# Patient Record
Sex: Male | Born: 1940 | Race: White | Hispanic: No | Smoking: Never smoker
Health system: Southern US, Community
[De-identification: ages and names within clinical notes are randomized; demographics above are authoritative.]

## PROBLEM LIST (undated history)

## (undated) DIAGNOSIS — E119 Type 2 diabetes mellitus without complications: Secondary | ICD-10-CM

## (undated) DIAGNOSIS — I639 Cerebral infarction, unspecified: Secondary | ICD-10-CM

## (undated) DIAGNOSIS — I10 Essential (primary) hypertension: Secondary | ICD-10-CM

## (undated) DIAGNOSIS — I519 Heart disease, unspecified: Secondary | ICD-10-CM

## (undated) DIAGNOSIS — I2510 Atherosclerotic heart disease of native coronary artery without angina pectoris: Secondary | ICD-10-CM

## (undated) DIAGNOSIS — I5189 Other ill-defined heart diseases: Secondary | ICD-10-CM

## (undated) DIAGNOSIS — R55 Syncope and collapse: Secondary | ICD-10-CM

## (undated) DIAGNOSIS — E785 Hyperlipidemia, unspecified: Secondary | ICD-10-CM

## (undated) DIAGNOSIS — R931 Abnormal findings on diagnostic imaging of heart and coronary circulation: Secondary | ICD-10-CM

## (undated) HISTORY — DX: Hyperlipidemia, unspecified: E78.5

## (undated) HISTORY — DX: Cerebral infarction, unspecified: I63.9

## (undated) HISTORY — DX: Atherosclerotic heart disease of native coronary artery without angina pectoris: I25.10

## (undated) HISTORY — DX: Type 2 diabetes mellitus without complications: E11.9

## (undated) HISTORY — DX: Essential (primary) hypertension: I10

## (undated) HISTORY — DX: Heart disease, unspecified: I51.9

## (undated) HISTORY — DX: Syncope and collapse: R55

## (undated) HISTORY — DX: Abnormal findings on diagnostic imaging of heart and coronary circulation: R93.1

## (undated) HISTORY — DX: Other ill-defined heart diseases: I51.89

---

## 1999-04-01 ENCOUNTER — Encounter: Payer: Self-pay | Admitting: Internal Medicine

## 1999-04-01 ENCOUNTER — Inpatient Hospital Stay (HOSPITAL_COMMUNITY): Admission: EM | Admit: 1999-04-01 | Discharge: 1999-04-02 | Payer: Self-pay | Admitting: Emergency Medicine

## 2001-05-06 ENCOUNTER — Encounter: Payer: Self-pay | Admitting: *Deleted

## 2001-05-06 ENCOUNTER — Emergency Department (HOSPITAL_COMMUNITY): Admission: EM | Admit: 2001-05-06 | Discharge: 2001-05-06 | Payer: Self-pay | Admitting: *Deleted

## 2001-06-03 ENCOUNTER — Ambulatory Visit (HOSPITAL_COMMUNITY): Admission: RE | Admit: 2001-06-03 | Discharge: 2001-06-03 | Payer: Self-pay | Admitting: Internal Medicine

## 2002-01-22 HISTORY — PX: CARDIAC CATHETERIZATION: SHX172

## 2002-01-30 ENCOUNTER — Encounter: Payer: Self-pay | Admitting: Thoracic Surgery (Cardiothoracic Vascular Surgery)

## 2002-02-03 ENCOUNTER — Encounter: Payer: Self-pay | Admitting: Thoracic Surgery (Cardiothoracic Vascular Surgery)

## 2002-02-03 ENCOUNTER — Inpatient Hospital Stay (HOSPITAL_COMMUNITY)
Admission: RE | Admit: 2002-02-03 | Discharge: 2002-02-08 | Payer: Self-pay | Admitting: Thoracic Surgery (Cardiothoracic Vascular Surgery)

## 2002-02-03 DIAGNOSIS — I2510 Atherosclerotic heart disease of native coronary artery without angina pectoris: Secondary | ICD-10-CM

## 2002-02-03 HISTORY — DX: Atherosclerotic heart disease of native coronary artery without angina pectoris: I25.10

## 2002-02-03 HISTORY — PX: CORONARY ARTERY BYPASS GRAFT: SHX141

## 2002-02-04 ENCOUNTER — Encounter: Payer: Self-pay | Admitting: Thoracic Surgery (Cardiothoracic Vascular Surgery)

## 2002-02-05 ENCOUNTER — Encounter: Payer: Self-pay | Admitting: Thoracic Surgery (Cardiothoracic Vascular Surgery)

## 2002-03-03 ENCOUNTER — Encounter: Payer: Self-pay | Admitting: Thoracic Surgery (Cardiothoracic Vascular Surgery)

## 2002-03-03 ENCOUNTER — Encounter
Admission: RE | Admit: 2002-03-03 | Discharge: 2002-03-03 | Payer: Self-pay | Admitting: Thoracic Surgery (Cardiothoracic Vascular Surgery)

## 2002-03-23 ENCOUNTER — Encounter (HOSPITAL_COMMUNITY): Admission: RE | Admit: 2002-03-23 | Discharge: 2002-04-22 | Payer: Self-pay | Admitting: Cardiovascular Disease

## 2002-04-24 ENCOUNTER — Encounter (HOSPITAL_COMMUNITY): Admission: RE | Admit: 2002-04-24 | Discharge: 2002-05-24 | Payer: Self-pay | Admitting: Cardiovascular Disease

## 2002-05-25 ENCOUNTER — Encounter (HOSPITAL_COMMUNITY): Admission: RE | Admit: 2002-05-25 | Discharge: 2002-06-24 | Payer: Self-pay | Admitting: Cardiovascular Disease

## 2003-03-28 ENCOUNTER — Emergency Department (HOSPITAL_COMMUNITY): Admission: EM | Admit: 2003-03-28 | Discharge: 2003-03-29 | Payer: Self-pay | Admitting: *Deleted

## 2003-03-29 ENCOUNTER — Encounter: Payer: Self-pay | Admitting: *Deleted

## 2003-06-09 ENCOUNTER — Encounter: Payer: Self-pay | Admitting: Family Medicine

## 2003-06-09 ENCOUNTER — Ambulatory Visit (HOSPITAL_COMMUNITY): Admission: RE | Admit: 2003-06-09 | Discharge: 2003-06-09 | Payer: Self-pay | Admitting: Family Medicine

## 2005-01-07 ENCOUNTER — Inpatient Hospital Stay (HOSPITAL_COMMUNITY): Admission: EM | Admit: 2005-01-07 | Discharge: 2005-01-11 | Payer: Self-pay | Admitting: Emergency Medicine

## 2005-01-07 ENCOUNTER — Ambulatory Visit: Payer: Self-pay | Admitting: Orthopedic Surgery

## 2005-01-11 ENCOUNTER — Ambulatory Visit: Payer: Self-pay | Admitting: Orthopedic Surgery

## 2005-01-12 ENCOUNTER — Ambulatory Visit (HOSPITAL_COMMUNITY): Admission: RE | Admit: 2005-01-12 | Discharge: 2005-01-12 | Payer: Self-pay | Admitting: Orthopedic Surgery

## 2005-01-12 ENCOUNTER — Ambulatory Visit: Payer: Self-pay | Admitting: Orthopedic Surgery

## 2005-01-15 ENCOUNTER — Ambulatory Visit: Payer: Self-pay | Admitting: Orthopedic Surgery

## 2005-01-29 ENCOUNTER — Ambulatory Visit: Payer: Self-pay | Admitting: Orthopedic Surgery

## 2005-03-12 ENCOUNTER — Ambulatory Visit: Payer: Self-pay | Admitting: Orthopedic Surgery

## 2007-09-02 ENCOUNTER — Ambulatory Visit: Payer: Self-pay | Admitting: Internal Medicine

## 2007-09-02 ENCOUNTER — Inpatient Hospital Stay (HOSPITAL_COMMUNITY): Admission: EM | Admit: 2007-09-02 | Discharge: 2007-09-09 | Payer: Self-pay | Admitting: Emergency Medicine

## 2007-09-08 ENCOUNTER — Ambulatory Visit: Payer: Self-pay | Admitting: Physical Medicine & Rehabilitation

## 2007-10-14 ENCOUNTER — Encounter: Admission: RE | Admit: 2007-10-14 | Discharge: 2007-10-14 | Payer: Self-pay | Admitting: Neurological Surgery

## 2011-04-03 NOTE — Discharge Summary (Signed)
Mark Montoya, Mark Montoya                  ACCOUNT NO.:  [id number]   MEDICAL RECORD NO.:  [id number]          PATIENT TYPE:  INP   LOCATION:  3023                         FACILITY:  [facility]   PHYSICIAN:  Tia Alert, MD     DATE OF BIRTH:  Aug 27, 1941   DATE OF ADMISSION:  09/02/2007  DATE OF DISCHARGE:  09/09/2007                               DISCHARGE SUMMARY   ADMISSION DIAGNOSIS:  Left cerebellar hemorrhage and hypertension.   HISTORY OF PRESENT ILLNESS:  Mr. Boyd is a 70 year old gentleman with a  history of coronary artery disease, diabetes and hypertension, who was  brought to the emergency department with acute onset headache,  dizziness, nausea and vomiting on the morning of September 02, 2007.  He  was admitted with a blood pressure of 194/94.  He complained of severe  headache. He had head CT which showed a small, left cerebellar  hemorrhage and neurosurgical evaluation was requested.  The patient  complained of a headache with nausea and vomiting.  Had some emesis in  the emergency department.  He also had some double vision at that time  but he was awake and alert followed commands.  His blood pressure upon  my arrival to the emergency department was 164/65 with a pulse of 62 and  he has been treated by the emergency room physicians.  Head CT showed  the small cerebellar hemorrhage without evidence of hydrocephalus.  We  felt this needed no surgical intervention.  Acutely he was admitted to  the neurosurgical ICU for further care.   HOSPITAL COURSE:  The patient was admitted to the neurosurgical ICU.  The critical care medical physicians were consulted and they helped  control his blood pressure.  Initially this was controlled with a  Cardene drip.  We were then able to wean the Cardene drip over time and  place him back on his minoxidil, clonidine and Norvasc as he was taking  at home and he had good control of his blood pressure.  He had an early  follow-up head CT just 6  hours after admission which showed no  progression of the hemorrhage or developing hydrocephalus.  He then had  a follow-up head CT 2 days later which again was stable.  He did quite  well neurologically. Physical and occupational therapy were consulted.  He was able to ambulate with his walker.  He had some imbalance.  Rehabilitation was consulted.  They felt he was doing too well for  inpatient rehabilitation and suggested we send him home with outpatient  physical and occupational therapy.  This was arranged and he was  discharged to home in stable condition on September 09, 2007, with plans  to follow up with me in 2 weeks.   DISCHARGE MEDICATIONS:  1. Minoxidil 2.5 mg p.o. b.i.d.  2. Clonidine 0.1 mg p.o. b.i.d.  3. Norvasc 10 mg p.o. daily.  4. He was to continue his aspirin as he was taking at home.  5. He also was to resume his Aricept, Glucophage, Xanax, Nitrostat,      Benadryl, vitamin  C and multivitamin and glipizide as he was using      at home.   He had all of his discharge questions answered to his satisfaction and  demonstrated understanding of all discharge instructions.  His followup  is in 2 weeks with me.  He was asked to follow up with his primary care  physician regarding blood pressure control.   FINAL DIAGNOSES:  1. Left cerebellar hemorrhage.  2. Hypertension.  3. Diabetes mellitus.      Tia Alert, MD  Electronically Signed     DSJ/MEDQ  D:  09/09/2007  T:  09/09/2007  Job:  [id number]

## 2011-04-03 NOTE — H&P (Signed)
NAMEWESLY, WHISENANT NO.:  [id number]   MEDICAL RECORD NO.:  [id number]          PATIENT TYPE:  INP   LOCATION:  1832                         FACILITY:  [facility]   PHYSICIAN:  Tia Alert, MD     DATE OF BIRTH:  [birth year]   DATE OF ADMISSION:  09/02/2007  DATE OF DISCHARGE:                              HISTORY & PHYSICAL   ADMITTING DIAGNOSIS:  Left cerebellar hemorrhage and hypertension.   HISTORY OF PRESENT ILLNESS:  Mr. Perko is a 70 year old gentleman with a  history of coronary artery disease, diabetes and hypertension who was  brought to the emergency department today with headache, dizziness, and  nausea and vomiting that started this morning after breakfast.  He came  with a blood pressure of 194/94.  He complained of headache.  Head CT  showed a small left cerebellar hemorrhage and neurosurgical evaluation  was requested.  The patient now complains of headache and some nausea  and vomiting.  He actually had some emesis while I was in the room.  He  denies any significant double vision, change in vision, seizure  activity, numbness, tingling or weakness.   PAST MEDICAL HISTORY:  1. Coronary artery disease.  2. Diabetes.  3. Hypertension.  4. Hyperlipidemia.  5. Anxiety.   MEDICATIONS INCLUDE:  Xanax, minoxidil, metformin, Aricept, potassium  chloride, lisinopril, bumetanide, Norvasc, glipizide, clonidine,  Nitrostat, Ambien, aspirin, Benadryl, vitamin C, and multivitamin.   ALLERGIES:  Penicillin.   SOCIAL HISTORY:  He is a nondrinker, nonsmoker.  He is a Visual merchandiser.   PHYSICAL EXAM:  His blood pressure is now 164/65, pulse is 62,  temperature 96.8.  GENERAL:  Cooperative gentleman lying in a stretcher in some obvious  discomfort.  HEENT: No evidence of external trauma.  His extraocular movements are  intact with some nystagmus, which is fine to the left, not much  nystagmus to the right, pupils are equal and reactive.  No facial  asymmetry.  NECK:  Is supple.  HEART:  Regular rate and rhythm.  EXTREMITIES:  No obvious deformities.  NEUROLOGICAL EXAM:  He is awake and alert.  He is interactive.  He  answers questions appropriately, he follows commands briskly, no facial  asymmetry.  Tongue protrudes in midline and he can puff out his cheeks.  No pronator drift.  He moves all extremities well.  He seems to have  good rapid alternating movements, good finger tapping and good finger-  nose-finger.  His gait is not tested.   IMAGING STUDIES:  CT scan of the head I have reviewed as well as report.  This shows a small flame shape hemorrhage in the left cerebellar tonsil  extending up in the medial left cerebellar hemisphere.  There is some  mild mass effect upon the brainstem and fourth ventricle, but the fourth  ventricle appears to be open at this point.  There is no hydrocephalus.  The third ventricle is still slit-like and not rounded.  There are no  significant temporal horns.   ASSESSMENT AND PLAN:  This is a 70 year old gentleman with a  hypertensive cerebellar hemorrhage on the left.  I would like to  repeat  his CT scan in about 4 hours to make sure there is no progression of the  hemorrhage or progression of hydrocephalus.  I have explained all of  this to the family.  We would like to control his blood pressure with IV  blood pressure medications.  We will ask the critical care medicine  physicians to help with that.  I have explained to them the risk of  progressively enlarging hemorrhage in the risk of development of  hydrocephalus and the potential treatments of those things and they  demonstrated understanding.  We will admit him to the ICU.  We will  check his neurologic exam every hour, keep his head of bed elevated,  again repeat his head CT in 4 hours.  We will control his hyperglycemia  with a sliding scale insulin.  We will continue his home medications  other than his aspirin, his Benadryl and his  Ambien.      Tia Alert, MD  Electronically Signed     DSJ/MEDQ  D:  09/02/2007  T:  09/03/2007  Job:  [id number]

## 2011-04-03 NOTE — Consult Note (Signed)
NAME:  Mark Montoya, Mark Montoya                  ACCOUNT NO.:  [id number]   MEDICAL RECORD NO.:  [id number]           PATIENT TYPE:   LOCATION:                                 FACILITY:   PHYSICIAN:  Kalman Shan, MD   DATE OF BIRTH:  March 18, 1941   DATE OF CONSULTATION:  09/02/2007  DATE OF DISCHARGE:                                 CONSULTATION   NOTE DICTATED BY: Zenia Resides NP  EDITED and Supervised by: Dr. Kalman Shan, MD   REFERRING PHYSICIAN:  Dr. Marikay Alar   REASON FOR CONSULTATION:  Hypertension management and critical care  medicine in the setting of left cerebellar hemorrhage.   HISTORY OF PRESENT ILLNESS:  This is a 70 year old white male patient  who was in his usual state of health until this morning.  Per his wife  and daughter's recollection, he went out to breakfast this morning and  upon returning in the later morning at his daughter's house she noted  him to be staggering and he reported that he felt drunk.  They noted  rapidly progressive weakness and acute onset of headache with associated  nausea, vomiting, and diaphoresis.  His daughter took his blood pressure  at her house as the patient actually had his blood pressure machine with  him stating he thought his blood pressure was up.  She noted it to be in  the 180's over 90's.  Because of his profound weakness he was unable to  ambulate without the assistance of two grown adults.  Because of this  the EMS was called.  Per the family's recollection, EMS arrived  approximately at 12:30.  He was transferred to [facility].  A  CT of the head was obtained that demonstrated a left cerebellar  intracranial hemorrhage.  The pulmonary critical care service was asked  to assist with his care specifically for blood pressure management.   PAST MEDICAL HISTORY:  1. Coronary artery disease status post CABG in 2003 by Dr.      Dorris Fetch.  2. Hypertension.  3. Syncopal episodes thought to be secondary to  multiple      antihypertensive medications.  4. Type 2 diabetes.  5. Depression.  6. Hyperlipidemia.  7. Dementia with good response to Aricept.   SOCIAL HISTORY:  Works on a farm, never smoked, does chew tobacco.  Denies alcohol.  Fully functional in spite of history of dementia.  Family reports his medications are set out for him daily and that he is  compliant.   FAMILY HISTORY:  Coronary artery disease, history of strokes, history of  diabetes.   ALLERGIES:  PENICILLIN.   HOME MEDICATIONS:  Family does not have dosing at this time.  Xanax,  minoxidil, metformin, Aricept, potassium chloride, lisinopril, Betadine,  Norvasc, vitamin C, glipizide, clonidine, nitroglycerin sublingual,  Ambien, and aspirin.   REVIEW OF SYSTEMS:  Per HPI.   PHYSICAL EXAMINATION:  VITAL SIGNS:  Temperature 96.8, heart rate in the  60's, normal sinus rhythm, blood pressure 180's/90's to 140's/70s,  respirations 20, saturations 100%.  GENERAL:  This is a pleasant  70 year old male patient currently  complaining of some mild photophobia and photoirritation, no acute  distress at this time.  HEENT:  Normocephalic.  No JVD or adenopathy.  PULMONARY:  Clear to auscultation.  CARDIAC:  Regular rate and rhythm.  EXTREMITIES:  Notable for brisk cap refill, 2+ pulses, no edema.  ABDOMEN:  Soft, nontender, positive bowel sounds.  GU:  Able to void.  NEUROLOGIC:  Moves all extremities, oriented x3.  No focal deficits.  Cranial nerves are currently intact.  Again does note some  photosensitivity. Dr. Marchelle Gearing exam at approx 17:15h noted some mild  past pointing on finger to finger test on left side. No past pointnig on  heel-knee test or finger-nose test. No intention tremor.   LABORATORY DATA:  White blood cell count 9, hemoglobin 11.9, hematocrit  35, platelet count 140.  Cardiac markers:  CK-MB 1.4, troponin I 0.05.  Sodium 139, potassium 3.3, chloride 105, CO2 26, glucose 215, BUN 15,  creatinine  0.99.  Total bilirubin 1.3.   CT of head obtained today demonstrating a left cerebellar intercerebral  hemorrhage with mass effect upon fourth ventricle.   IMPRESSION/PLAN:  1. Left cerebellar intracranial.  Plan for this is to provide strict      blood pressure control.  I have spoken with Dr. Yetta Barre and agreed      upon goal of systolic goal range from 130-160 in the acute setting      given that the patient likely does have longstanding poorly      controlled hypertension.  To achieve this we will initiate him on a      Cardene drip initiating at 5 mg an hour and titration again for      goal of 130 to 160 systolic blood pressure.  Additionally from a      plan for this he will obtain a follow-up CT of head at 7 p.m.      tonight given concern for obstructive hydrocephalus given mass      effect on fourth ventricle.   1. Hypertension.  This appears to be longstanding given his multiple      antihypertensive medication regimen.  Given his risk for altered      mental status secondary to problem #1, we will keep him n.p.o. and      place him on a Cardene drip with a goal as previously described as      keeping his systolic blood pressure 130 to 160.  Will keep n.p.o.      at this time and then after it is evident that he is clinically      stabilized can resume antihypertensive regimen per home.   1. Diabetes type 2.  Plan for this is to continue sliding scale      protocol and then finally hospital care plan for this is to provide      a routine prophylaxis in the form of bilateral PAS hose for DVT      prophylaxis as well as proton pump inhibitors for stress ulcer      prophylaxis.   STAFF NOTE: Personally evaluated [name]. Spent 35 minutes critical  care time in eliciting history from [facility], and notes and from RN  in 3100. Personally examined patient. Agree with NPs notes, findings and  plan. KEY: Cerebellar hemorrhage with mass effect but very little focal  effect  currently. BP to be kept between 130-160 systolic through  nicardipine drip. Needs a-line.  Zenia Resides, NP      Kalman Shan, MD  Electronically Signed    PB/MEDQ  D:  09/02/2007  T:  09/03/2007  Job:  [id number]   cc:   Marikay Alar, M.D.

## 2011-04-06 NOTE — Procedures (Signed)
NAMEBINYAMIN, Mark Montoya                  ACCOUNT NO.:  [id number]   MEDICAL RECORD NO.:  [id number]          PATIENT TYPE:  INP   LOCATION:  A225                          FACILITY:  [facility]   PHYSICIAN:  Dani Gobble, MD       DATE OF BIRTH:  28-Apr-1941   DATE OF PROCEDURE:  01/08/2005  DATE OF DISCHARGE:                                  ECHOCARDIOGRAM   REFERRING PHYSICIAN:  Dr. Sudie Bailey.   INDICATIONS:  [name] is a 70 year old gentleman with a past medical  history of diabetes mellitus and CAD status post CABG in 2003 who  experienced presyncope and is referred for echocardiogram to evaluate  structural heart disease.   TECHNICAL QUALITY OF THE STUDY:  Adequate.   The aorta is within normal limits at 3.3 cm.   The left atrium is mildly dilated.  The patient appeared to be in sinus  rhythm during this procedure.  No obvious clots or masses were appreciated.   The __________ septum and posterior wall are both mildly thickened with  additional basal septal hypertrophy overlay.   The aortic valve is mildly thickened with mild calcification but no  significant restriction to leaflet excursion.  Mild to moderate aortic  insufficiency is noted.  Doppler interrogation of the aortic valve is  consistent with mild aortic sclerosis without significant valvular stenosis.   The mitral valve is mildly thickened but with normal leaflet excursion.  No  mitral valve prolapse is noted.  Mild mitral annular calcification is noted.  Mild mitral regurgitation is noted.  Doppler interrogation of the mitral  valve is within normal limits.   The pulmonic valve is not well visualized.   The tricuspid valve appears grossly structurally normal with mild tricuspid  regurgitation noted.   The left ventricle is normal in size with the LVIDd measured at 4.6 cm and  the LVIDs measured at 3.4 cm.  Overall left ventricular systolic function is  normal.  No regional wall motion abnormalities are noted.   The  right atrium and right ventricle appear grossly normal in size, and  right ventricular systolic function appears normal.   IMPRESSION:  1.  Mild left atrial enlargement.  2.  Mild concentric left ventricular hypertrophy, with additional basal      septal hypertrophy overlay.  3.  Mild aortic sclerosis, without stenosis.  4.  Mild to moderate aortic insufficiency.  5.  Mild mitral regurgitation.  6.  Mild tricuspid regurgitation.  7.  Mild mitral annular calcification.  8.  Normal left ventricular size and systolic function, without regional      wall motion abnormality noted.      AB/MEDQ  D:  01/08/2005  T:  01/09/2005  Job:  [id number]   cc:   [name], M.D.  [address] [place], [place] [address]  Fax: [phone]

## 2011-04-06 NOTE — Consult Note (Signed)
NAMEHALLIE, ISHIDA                  ACCOUNT NO.:  [id number]   MEDICAL RECORD NO.:  [id number]          PATIENT TYPE:  INP   LOCATION:  A225                          FACILITY:  [facility]   PHYSICIAN:  Vickki Hearing, M.D.DATE OF BIRTH:  04-24-1941   DATE OF CONSULTATION:  01/10/2005  DATE OF DISCHARGE:  01/11/2005                                   CONSULTATION   REQUESTING PHYSICIAN:  Dr. Sudie Bailey.   REASON FOR CONSULTATION:  Right long finger swelling.   HISTORY:  The history is recorded in the medical record by Dr. Sudie Bailey. The  patient has a crush injury to the right long finger which was treated with  paper clip drainage but did not respond well and has progressively increased  in swelling and is causing the patient discomfort. Radiographs showed a  questionable fracture at the middle phalanx of the right long finger.   PHYSICAL EXAMINATION:  GENERAL:  The patient's exam shows a mesomorphic  male. Normal development, grooming and hygiene. Normal pulses without  swelling, edema, or tenderness. No lymphadenopathy. The sensation to the  digit is intact. He is awake, alert, and oriented x3 with a pleasant mood.  SKIN AND MUSCULOSKELETAL:  There is ecchymosis in the finger with hematoma  under the nail and paronychial tissue. The pulp space appears to be  nontender though swollen. There is no lymphangitis. Range of motion at the  IP joint remains normal. There is no instability to the DIP or PIP joint.   Radiographs as stated.   IMPRESSION:  Right long finger paronychia.   RECOMMENDATIONS:  Recommendation drainage when patient medically cleared.      SEH/MEDQ  D:  01/14/2005  T:  01/15/2005  Job:  [id number]

## 2011-04-06 NOTE — Discharge Summary (Signed)
[facility]  Patient:    ESIAS, MORY Visit Number: [id number] MRN: [id number]          Service Type: SUR Location: 2000 2029 01 Attending Physician:  Charlett Lango Dictated by:   Gwenyth Bender, R.N.F.A. Admit Date:  02/03/2002 Discharge Date: 02/08/2002   CC:         John Giovanni, M.D.  Runell Gess, M.D.   Discharge Summary  ADMITTING DIAGNOSIS:  Coronary artery disease.  PAST MEDICAL HISTORY: 1. Diabetes mellitus, type 2. 2. Hypertension. 3. Hypercholesterolemia. 4. History of transient ischemic attack in 2000.  ALLERGIES:  IV CONTRAST DYE.  DISCHARGE DIAGNOSES:  Three-vessel coronary artery disease with progressive angina, status post coronary artery bypass grafting.  BRIEF HISTORY:  Mr. Matheney is a 70 year old Caucasian man.  Over the last year, he has noted a decreased in energy and becomes easily fatigued.  He experienced occasional chest tightness during this time.  Over the past few weeks, prior to admission, he noticed increased episodes of his chest discomfort, sometimes with radiation to his left arm.  There was no nausea, vomiting, diaphoresis, and no shortness of breath associated with the symptoms.  He was evaluated by Dr. Allyson Sabal in his office who recommended cardiac catheterization.  Catheterization was performed on March 6, which revealed three-vessel coronary artery disease, and preserved left ventricular function. He was then referred to [facility] for consideration of coronary artery bypass graft.  Dr. Charlett Lango evaluated him in the office on January 28, 2002. After examination of the patient and review of the records, he recommended coronary artery bypass graft as the preferred treatment choice in this gentleman.  The procedure risks and benefits were discussed and the patient agreed to proceed.  HOSPITAL COURSE:  On March 18, Mr. Yeager was electively admitted to [facility] under the  care of Dr. Charlett Lango.  He underwent the following surgical procedure.  Coronary artery bypass grafting x5.  Grafts placed at the time of the procedure were the left internal mammary artery grafted to the left anterior descending, saphenous vein was grafted in the sequential fashion to the first diagonal and second diagonal arteries, saphenous vein was grafted to the first obtuse marginal artery, saphenous vein was grafted to the posterior descending artery.  Vein was harvested from the right leg for the bypass grafts.  Mr. Arya tolerated this procedure well and was transferred in stable condition to the SICU.  Mr. Folks postoperative course has been uneventful.  He has remained hemodynamically stable.  He had some mild thrombocytopenia which has resolved. He is also mildly volume overloaded, but this is resolving also.  His CBGs are mildly elevated and his Glucotrol was restarted on postoperative day #1, and Glucophage will be restarted on postoperative day #3.  Mr. Blessinger is progressing very well and recovering from his surgery.  It is anticipated he will be ready for discharge home tomorrow, on February 08, 2002.  CONDITION ON DISCHARGE:  Improved.  DISCHARGE INSTRUCTIONS:  Medications, activity, diet, wound care, and followup appointments.  Please see the discharge instruction sheet for details.  DISCHARGE MEDICATIONS: 1. Enteric coated aspirin 325 mg p.o. q.d. 2. Tylox 1-2 p.o. q.4-6h. p.r.n. for pain. 3. Lopressor 25 mg p.o. q.12h. 4. Lasix 40 mg p.o. q.d. 5. Potassium chloride 20 mEq p.o. q.d.  He has been instructed to resume the following home medications. 1. Aricept 10 mg p.o. q.d. 2. Glucotrol 10 mg p.o. q.d. 3. Glucophage 500 mg p.o. b.i.d.  4. Lipitor 20 mg p.o. q.d. 5. Accupril 20 mg p.o. q.d. 6. Zoloft 50 mg p.o. q.h.s. 7. Ambien 10 mg p.o. q.h.s. 8. Alprazolam 0.5 mg p.o. q.h.s. 9. Vitamin B and Vitamin E supplements.  FOLLOWUP: 1. Mr. Orndoff has been  asked to call Dr. Renelda Mom office and arrange an    appointment to see him in approximately two weeks. 2. He will have an appointment to see Dr. Dorris Fetch in the CVTS office    in approximately three weeks.  The office will call with the date and    time of that appointment.  He will be asked to get a chest x-ray at [facility]    one hour before that appointment. Dictated by:   Gwenyth Bender, R.N.F.A. Attending Physician:  Charlett Lango DD:  02/07/02 TD:  02/09/02 Job: [id number]

## 2011-04-06 NOTE — Op Note (Signed)
NAME:  WENZEL, BACKLUND                  ACCOUNT NO.:  [id number]   MEDICAL RECORD NO.:  [id number]          PATIENT TYPE:  AMB   LOCATION:  DAY                           FACILITY:  [facility]   PHYSICIAN:  Vickki Hearing, M.D.DATE OF BIRTH:  04/15/1941   DATE OF PROCEDURE:  01/12/2005  DATE OF DISCHARGE:  01/12/2005                                 OPERATIVE REPORT   CHIEF COMPLAINT:  Pain and swelling, right long finger.   PREOPERATIVE DIAGNOSIS:  Right long finger paronychia.   POSTOPERATIVE DIAGNOSIS:  Right long finger paronychia.   PROCEDURE:  Incision and drainage of right long finger.   SURGEON:  Dr. Romeo Apple.   ANESTHESIA:  Regional block.   OPERATIVE FINDINGS:  Subungual hematoma.   INDICATIONS FOR PROCEDURE:  Pain.   DETAILS OF PROCEDURE:  Mr. Rafuse was identified in the preoperative area.  His history was updated. His finger was marked as the surgical site,  countersigned by the surgeon. He was given preoperative antibiotics and  taken to the operating room for a block. After the block was established, he  was prepped and draped using sterile technique. A time out was taken as  required. We released the nail, removed it, cleaned out the hematoma, and  replaced the nail bed as a dressing, dressed the finger, placed a sterile  dressing, and took the patient back to recovery room in stable condition.      SEH/MEDQ  D:  03-19-202006  T:  03-19-202006  Job:  [id number]

## 2011-04-06 NOTE — Group Therapy Note (Signed)
NAME:  Mark Montoya, Mark Montoya                  ACCOUNT NO.:  [id number]   MEDICAL RECORD NO.:  [id number]          PATIENT TYPE:  INP   LOCATION:  A225                          FACILITY:  [facility]   PHYSICIAN:  [name], M.D.DATE OF BIRTH:  November 13, 1941   DATE OF PROCEDURE:  DATE OF DISCHARGE:                                   PROGRESS NOTE   SUBJECTIVE:  He feels pretty well today.  He really has not had anymore  dizzy spells since Sunday, now two days ago.  His daughter says that when he  was at the church he actually turned purple and then white.  His heart rate,  measured by his peripheral pulse, was 38 at that time.  His sugar was 117.  Apparently at home, when he has been checking his blood pressures, his heart  rate has dropped down to the 40s routinely.   OBJECTIVE:  VITAL SIGNS:  Temperature is 97.4, pulse 58, respiratory rate  20, blood pressure 153/93.  GENERAL:  He is actually supine in bed in no acute distress.  He appears to  be oriented and alert.  He is well-developed and well-nourished.  HEART:  Regular rhythm and rate at about 60 at present.  LUNGS:  Clear and are moving air well.  ABDOMEN:  Soft without hepatosplenomegaly, mass, or tenderness.  EXTREMITIES:  No edema of the ankles.   LABORATORIES:  Glucose in the last 24 hours has been 145 and 114.  O2  saturation 94% on 2 L.  H&H is 12.2 and 34.1.  Potassium 2.9, glucose 144.  His troponin is 0.06, up from 0.02.  Yesterday CK and CK-MB were low.   ASSESSMENT:  1.  Near syncopal episode probably secondary to bradycardia on beta      blockers.  2.  Coronary artery disease status post coronary artery bypass graft three      years ago.  3.  Hypokalemia.  4.  Type 2 diabetes fairly well controlled.   PLAN:  He is going to get potassium replenished today.  I talked to  cardiology.  Once his potassium is up to 3.5, they will go ahead and proceed  with the Cardiolite stress.      SDK/MEDQ  D:  01/09/2005  T:   01/09/2005  Job:  [id number]

## 2011-04-06 NOTE — Op Note (Signed)
[place]  Patient:    Mark Montoya, Mark Montoya Visit Number: [id number] MRN: [id number]          Service Type: SUR Location: 2300 [id number] Attending Physician:  Charlett Lango Dictated by:   Salvatore Decent. Dorris Fetch, M.D. Proc. Date: 02/03/02 Admit Date:  02/03/2002   CC:         Runell Gess, M.D.  John Giovanni, M.D.   Operative Report  PREOPERATIVE DIAGNOSIS:  Three-vessel coronary artery disease with progressive class 3 angina.  POSTOPERATIVE DIAGNOSIS:  Three-vessel coronary artery disease with progressive class 3 angina.  OPERATION PERFORMED:  Median sternotomy, extracorporeal circulation.  Coronary artery bypass grafting times five (left internal mammary artery to the left anterior descending, sequential saphenous vein Y-graft to third and second diagonals, saphenous vein graft to obtuse marginal 3, saphenous vein graft to posterior descending coronary artery).  SURGEON:  Salvatore Decent. Dorris Fetch, M.D.  ASSISTANT:  Sherrie George, P.A.  ANESTHESIA:  General.  OPERATIVE FINDINGS:  Good quality targets.  Good quality conduits.  Left ventricular hypertrophy.  Diffuse plaquing in all coronaries proximally.  INDICATIONS FOR PROCEDURE:  The patient is a 70 year old male with a strong family history of coronary disease.  He presented with progressive class 3 anginal symptoms.  He was seen and evaluated by Dr. Nanetta Batty and underwent cardiac catheterization which revealed three-vessel disease.  He had a 95% critical lesion in the LAD, involving the take-off two large diagonal branches.  There was also a 70% stenosis in the left circumflex.  In the right coronary, he had multiple 40 to 50% lesions in the setting of a diffusely diseased vessel.  The patient was referred for consideration of coronary artery bypass grafting.  The indications, risks, benefits and alternative treatments were discussed in detail with the.  He understood  the risks associated with bypass surgery.  We did discussed the possibility of grafting the right coronary despite the fact that there were no hemodynamically flow-limiting lesions at the present time.  The patient understood and accepted the risks of bypass surgery and agreed to proceed.  DESCRIPTION OF PROCEDURE:  The patient was brought to the preop holding area on February 03, 2002.  Lines were placed to monitor arterial, central venous and pulmonary arterial pressure.  EKG leads were placed for continuous telemetry. The patient was taken to the operating room, anesthetized and intubated.  A Foley catheter was placed.  Intravenous antibiotics were administered.  The chest, abdomen and legs were prepped and draped in the usual fashion.  A median sternotomy was performed and the left internal mammary artery was harvested using standard techniques.  Simultaneously incision was made in the medial aspect of the right leg and the greater saphenous vein was harvested from the ankle to the midthigh.  Both the saphenous vein and mammary artery were good quality.  He was fully heparinized prior to dividing the distal end of the mammary artery.  There was good flow through the cut end of the vessel.  The pericardium was opened.  The ascending aorta was inspected and palpated. There was no palpable atherosclerotic disease.  The aorta was cannulated via concentric 2-0 Ethibond pledgeted pursestring sutures.  A dual stage venous cannula was placed via a pursestring suture in the right atrial appendage. Cardiopulmonary bypass was instituted and the patient was cooled to 32 degrees Celsius.  The coronary arteries were inspected and anastomotic sites were chosen.  Of note, there was diffuse disease in all coronaries although all  were of good quality at the site selected for grafting.  There was heavy calcification in the distal right coronary as well as in the proximal portion of the posterior  descending.  The conduits were inspected and cut to length. A foam pad was placed in the pericardium to protect the left phrenic nerve.  A temperature probe was placed in the myocardial septum and the cardioplegia cannula was placed in the ascending aorta.  The aorta was crossclamped.  The left ventricle was emptied via the aortic root vent.  Cardiac arrest then was achieved with a combination of cold antegrade blood cardioplegia and topical iced saline.  1L of cardioplegia was administered.  The myocardial septal temperature was 12 degrees Celsius.  The following distal anastomoses were performed.  First a reversed saphenous vein graft was placed end-to-side to the posterior descending branch of the right coronary.  This was a 1.5 mm vessel at the site of the anastomosis.  The vein graft was of good quality.  The anastomosis was performed end-to-side with a running 7-0 7-0 Prolene suture.  There was good flow through the graft.  Cardioplegia was administered and there was good hemostasis.  Next a reversed saphenous vein graft was placed end-to-side to the third obtuse marginal branch.  There was palpable atherosclerotic disease in the main portion of the circ.  It then gave off OM2 and 3.  There was no palpable disease in these vessels.  OM3 was selected for grafting because it was the largest of the vessels and was 2 mm in diameter.  The vein graft was anastomosed end-to-side with a running 7-0 Prolene suture.  There was excellent flow through the graft.  Cardioplegia was administered and again there was good hemostasis.  Next, reversed saphenous vein was prepared for grafting to the second and third diagonal branches of the LAD.  These were two large anterolateral branches.  The first diagonal was a tiny vessel.  OM2 and 3 were both 1.5 mm vessels.  Both were diffusely diseased.  The vessels ran in close proximity.  A natural Y branch of the saphenous vein was used and a  Y-graft  was performed with two end-to-side anastomoses at the two diagonal vessels.  Both were performed with running 7-0 Prolene sutures.  There was excellent flow through the graft.  Again, cardioplegia was administered.  A small leak from the heel of the third diagonal anastomosis was repaired with a 7-0 Prolene suture.  Next, a left internal mammary artery was brought through a window in the pericardium.  The distal end of the mammary artery was spatulated and was anastomosed end-to-side to the distal LAD.  The LAD was a 1.5 mm vessel at the site of the anastomosis.  It was diffusely diseased with calcified plaque proximally.  The mammary artery was of excellent quality.  The anastomosis was performed with a running 8-0 Prolene suture.  At the completion of the mammary to LAD anastomosis the bulldog clamp was removed from the mammary artery. Immediate and rapid septal rewarming was noted.  Lidocaine was administered. The mammary pedicle was tacked to the epicardial surface of the heart with 6-0 Prolene sutures.  The aortic crossclamp was removed.  Total crossclamp time was 62 minutes.  The patient fibrillated after removal of the crossclamp.  Two attempts to defibrillate with 20 joules were unsuccessful.  The patient then was successfully defibrillated with 30 joules, resumed sinus rhythm and remained in sinus rhythm with no further arrhythmias throughout the procedure.  A partial occlusion clamp was placed on the ascending aorta.  The vein grafts were cut to length.  The cardioplegia cannula was removed.  The proximal vein graft anastomoses were performed to  4.4 mm punch aortotomies with running 6-0 Prolene sutures.  At the completion of the final proximal anastomosis, the patient was placed in Trendelenburg position.  Deairing maneuvers were performed and the partial clamp was removed.  All proximal and distal anastomoses were inspected for hemostasis.  A 6-0 Prolene patch suture  was required at the proximal of the posterior descending graft.  The patient was being rewarmed during this time.  Epicardial pacing wires were placed on the right ventricle and right atrium.  The patient was atrially paced for rate at 80 beats per minute.  When the core temperature reached 37 degrees Celsius, the patient was weaned from cardiopulmonary bypass without difficulty.  The total bypass time was 138 minutes.  He was on no inotropic support and was in normal sinus rhythm at the time of separation from bypass.  The initial cardiac index was greater than 2L per minute per meter squared and he remained hemodynamically stable throughout the postbypass period.  A test dose of protamine was administered and was well tolerated.  The atrial and aortic cannulae were removed.  The remainder of the protamine was administered without incident.  The chest was irrigated with 1L of warm normal saline containing 1 gm of vancomycin.  Hemostasis was achieved.  A left pleural and two mediastinal chest tubes were placed through separate subcostal incisions.  The pericardium was reapproximated with interrupted 3-0 silk sutures.  It came together easily without tension.  The sternum was closed with heavy gauge interrupted stainless steel wires.  The pectoralis fascia was closed with running #1 Vicryl suture.  The subcutaneous tissues were closed with running 2-0 Vicryl suture and the skin was closed with a 3-0 Vicryl subcuticular suture.  All sponge, needle and instrument counts were correct at the end of the procedure.  The patient remained hemodynamically stable and was taken from the operating room to the surgical intensive care unit. Dictated by:   Salvatore Decent Dorris Fetch, M.D. Attending Physician:  Charlett Lango DD:  02/03/02 TD:  02/04/02 Job: [id number]

## 2011-04-06 NOTE — Discharge Summary (Signed)
NAME:  Mark, Montoya                  ACCOUNT NO.:  [id number]   MEDICAL RECORD NO.:  [id number]          PATIENT TYPE:  INP   LOCATION:  A225                          FACILITY:  [facility]   PHYSICIAN:  [name], M.D.DATE OF BIRTH:  06/11/1941   DATE OF ADMISSION:  01/07/2005  DATE OF DISCHARGE:  LH                                 DISCHARGE SUMMARY   A 70 year old who was admitted to the hospital after a syncopal episode felt  to be probably secondary to bradycardia. He had a 5-day hospitalization  extending from January 07, 2005 to January 11, 2005. Initially, he was  bradycardic, but this cleared as the offending medications were stopped.   His admission CBC showed an H and H of 12.7 and 35.7, MCV of 82, platelet  count of 154,000, white cell count of 7,900. His BMET showed a potassium of  3.5, glucose 109, BUN 14, creatinine 1.2. Cardiac markers were normal. Urine  was negative. Hemoglobin A1c was 5.7%. TSH 2.74. His potassium dropped to  2.9 the third day. CBC remained stable. His third day, his troponin was up  to 0.06. His MB was only 0.7. Total CPK was 24. Hepatic function was normal  except for low albumin of 1.6. Lipase and amylase were normal. D-dimer 0.22.  Recheck potassium later the third day was 3.3. Lipid profile revealed a  cholesterol of 121, TG 194, HDL 23, LDL 51. His H pylori was 1.0  (equivocal). BMET the day before discharge revealed sodium 134, potassium  3.4, glucose 127. His H and H was 7.8 and 32.7.   Radiology studies included admission chest x-ray which showed no active  disease. It showed prior coronary artery bypass grafting, mild scarring at  the base of the left lung. His carotid ultrasound showed no significant  carotid vascular disease. His nuclear medicine Cardiolite study showed an  ejection fraction of 57%. There is minimally diminished perfusion of the  anterior septal along the left ventricle which is nonreversible.  No  evidence of   __________. The x-ray of his right middle finger showed a  possible nondisplaced fracture of the proximal aspect of his middle phalanx.   His admission EKG showed a rate of 58. The following day, his heart rate had  dropped to 49 on a monitor. It was seen to drop to 40.   His echocardiogram showed mild left atrial enlargement, mild concentric left  ventricular hypertrophy with basal septal hypertrophy and mild aortic  sclerosis without stenosis, mild to moderate aortic insufficiency, mild  mitral regurgitation, mild tricuspid regurgitation, mitral annular  calcification, normal left ventricular size and systolic function without  regional wall motion abnormality.   He was admitted to the hospital by Dr. Janna Arch, on call for me. He was  continued on Toprol XL 50 mg q.d., Zoloft 100 mg q.d., hydrochlorothiazide  25 mg q.d., Cardizem 240 mg q.d., Lipitor 40 mg q.d., Aricept 10 mg q.d.,  Ambien 10 mg q.h.s., and Glucophage 5 mg b.i.d. He was put on a clear liquid  diet and adenosine thallium was scheduled. He is  also on aspirin 81 mg q.d.  and was put on a 2,000 calorie ADA diet, given alprazolam 0.5 mg q.i.d.  p.r.n. anxiety and for sleep.   His second day, his Toprol was held. Minoxidil 2.5 mg q.d. was added.  Alprazolam was changed to 1 q.a.m. and 2 q.h.s. He was given quinapril 40 mg  q.d. Hydrochlorothiazide was decreased to 12.5 mg q.d. He received Phenergan  for nausea.   His third day, he required 40 mEq of KCl stat after his potassium level  dropped. Later, he had a recheck of potassium which had come up nicely, so  he could have his procedure done. That day, his lisinopril was increased to  40 mg b.i.d. He was started on Norvasc 5 mg q.d. The following day, that was  increased to 10 mg q.d. He was given another dose of KCl 40 mEq. He was off  the Cardizem.   On his fourth day, his Lovenox was stopped. By this time, he was ambulating  well in the hall, sitting up in a chair.  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 It was really throbbing and painful. So x-rays were done  which showed the possible fracture noted as above. Dr. Romeo Apple,  orthopedist, was called in consult. Later, clonidine 0.1 mg was added b.i.d.   He was seen by Dr. Domingo Sep, cardiologist, during the hospitalization and by  Dr. Romeo Apple as mentioned above.   He had reached maximum benefit from hospitalization by fifth hospital day.  His heart rate was up in the 60s. His blood pressure was under adequate  control. It was felt that he was ready for discharge home with followup in  the office in four to five days. At that time, his wife was to bring every  medicine he had in for review. The medication list was personally printed  out by myself, and the patient's family were reminded to stop metoprolol and  Cardizem at home and to stop quinapril. He wished to continue with Lipitor  40 mg q.d., Zoloft 100 mg q.d., bumetanide 1 mg q.d., glipizide 10 mg q.d.,  metformin 5 mg b.i.d., Aricept 10 mg q.d., Ambien 10 mg 2 at bedtime, Xanax  0.5 mg 3 times a day, aspirin 81 mg q.d., multivitamins q.d. He is continue  with minoxidil 2.5 mg q.d. and hydrochlorothiazide 25 mg to take a full  tablet of that q.d. Added was clonidine 1 mg b.i.d. (60 with 3 refills),  lisinopril 40 mg b.i.d. (60 with 3 refills), Norvasc 10 mg q.d. (30 with 3  refills). He is to stop his quinapril. Will consider adding Niaspan 5 mg  q.h.s. given his persistent low HDL.   Arrangements were made for him to see Dr. Romeo Apple as an outpatient later  the day of discharge for removal of his finger nail, and he would start him  on antibiotics at that time.   FINAL DIAGNOSES:  1.  Syncopal episode, probably secondary to sinus bradycardia from      medications.  2.  Coronary artery disease status post coronary artery bypass grafting. 3.  Essential  hypertension.  4.  Infected subungual hematoma.  5.  Possible fracture of the distal right middle phalanx.  6.  Type 2 diabetes, well controlled.  7.  Depression.  8.  Dementia, probably Alzheimer's.  9.  Hypercholesterolemia.   Time spent on this patient today reviewing  his paper medical record, the  electronic medical record, talking to patient's wife, examining the patient,  talking to nursing about him, printing out his medication list, dictating  the note was 50 minutes.      SDK/MEDQ  D:  01/11/2005  T:  01/11/2005  Job:  [id number]   cc:   Dani Gobble, MD  Fax: [phone]

## 2011-04-06 NOTE — H&P (Signed)
NAME:  Mark Montoya, Mark Montoya                  ACCOUNT NO.:  [id number]   MEDICAL RECORD NO.:  [id number]          PATIENT TYPE:  INP   LOCATION:  A225                          FACILITY:  [facility]   PHYSICIAN:  Vickki Hearing, M.D.DATE OF BIRTH:  02/07/1941   DATE OF ADMISSION:  01/07/2005  DATE OF DISCHARGE:  02/23/2006LH                                HISTORY & PHYSICAL   CHIEF COMPLAINT:  Pain and swelling of the right long finger/middle finger.   HISTORY:  This is a 70 year old male who was admitted by Dr. Sudie Bailey for a  syncopal episode on 01/07/2005.  He as recently discharged on 01/11/2005  after cardiac workup.  Basically this patient had a crush-type injury to the  tip of his right long finger.  He developed a hematoma and an attempt was  made to evacuate the hematoma using the well-described hot-paper-clip  technique.  Although the finger drained for a few days it began to swell  around the edges of the nail and has formed a perionychium and will need to  be drained.  This patient has a history of syncope episode from sinus  bradycardia mainly thought to be due to medication.  He has coronary artery  disease status post bypass graft.  He has a central hypertension.  He has  type 2 diabetes, some depression and mild Alzheimer's disease.   CURRENT MEDICATIONS:  1.  He was recently discharged on Lipitor 40 mg.  2.  Zoloft 100 mg.  3.  Bumetanide 1 mg.  4.  Glipizide 10 mg.  5.  Metformin 5 mg b.i.d.  6.  Aricept 10 mg daily.  7.  Ambien 10 mg 2 at bedtime.  8.  Xanax 0.5 mg 3 times daily.  9.  A baby aspirin.  10. A multivitamin.  11. Minoxidil.  12. Hydrochlorothiazide.  13. Clonidine.  14. Norvasc.  15. Lisinopril.   PHYSICAL EXAMINATION:  GENERAL:  He is mesomorphic, normal development,  grooming and hygiene.  CARDIOVASCULAR EXAM:  Peripherally shows minimal edema, intact pulses.  EXTREMITIES:  His right long finger has a large subungual blister.  There is  eponychial  nail fold swelling.  His pulp is nontender, though red.  The  joint is intact in terms of motion.  SKIN:  As stated, sensation is intact in that finger.  NEUROLOGIC:  He is awake, alert and oriented x3 with normal mood.   IMPRESSION:  Perionychia.   PLAN:  Draining right long finger.      SEH/MEDQ  D:  01/11/2005  T:  01/11/2005  Job:  [id number]

## 2011-04-06 NOTE — Group Therapy Note (Signed)
NAME:  Mark Montoya, Mark Montoya                  ACCOUNT NO.:  [id number]   MEDICAL RECORD NO.:  [id number]          PATIENT TYPE:  INP   LOCATION:  A225                          FACILITY:  [facility]   PHYSICIAN:  [name], M.D.DATE OF BIRTH:  07/14/1941   DATE OF PROCEDURE:  01/10/2005  DATE OF DISCHARGE:                                   PROGRESS NOTE   SUBJECTIVE:  The thing that is bothering him the most is his right middle  finger which he caught between a tractor pull-bar and the harrow.  He tried  to put a knife through the fingernail initially, and then I put a hot  paperclip through it last week, and it bled for a couple of days.  Still  having bluish discoloration and pain here.  This is really his chief  complaint.  He has had no other syncopal episodes.   OBJECTIVE:  VITAL SIGNS:  Temperature is 97.6, pulse 55, respiratory rate  20, blood pressure 117/92.  GENERAL:  He is sitting up.  He is oriented and alert.  He is in no acute  distress, well-developed, well-nourished.  HEART:  A regular rhythm, rate of about 60.  LUNGS:  Clear throughout, moving air well.  ABDOMEN:  Soft without hepatosplenomegaly or mass.  EXTREMITIES:  There is no edema in the ankles.  He has a bluish  discoloration involving the entire nail plate of the right middle finger.  This extends up to the cuticle.  It is swollen there and a very mild bit of  redness just proximal to this.   His monitor shows rate of 60.   His CBC today showed a white cell count of 4700.  H&H 11.8, 32.7.  His MET-  7:  A sodium 134, potassium 3.4, glucose 127.   ASSESSMENT:  1.  Sinus bradycardia, probably secondary to his negative chronotropic      agents.  2.  Essential hypertension.  3.  Coronary artery disease, status post CABG.  4.  Electrolyte abnormalities including hypokalemia and hyponatremia.  5.  Subungual hematoma.  6.  Anticoagulation.   PLAN:  Discussed with cardiology at length.  We are adding KCl 40 mEq  and  Norvasc is being increased to 10 mg daily, and he is going to ambulate in  the hall today.  If he is doing well with this, maybe he will be discharged  tomorrow.  I am calling Dr. Valentina Shaggy, orthopedist, to see his finger  because this has persisted.  I have stopped his Lovenox, and x-rays of the  right hand.   Time spent with this patient today reviewing his electronic medical record,  paper medical record, dealing with a multitude of problems, discussing with  consult, discussing with family, examining the patient and dictating a note  was 40 minutes.      SDK/MEDQ  D:  01/10/2005  T:  01/10/2005  Job:  [id number]

## 2011-04-06 NOTE — Group Therapy Note (Signed)
NAME:  Mark Montoya, Mark Montoya                  ACCOUNT NO.:  [id number]   MEDICAL RECORD NO.:  [id number]          PATIENT TYPE:  INP   LOCATION:  A225                          FACILITY:  [facility]   PHYSICIAN:  [name], M.D.DATE OF BIRTH:  1940/12/13   DATE OF PROCEDURE:  01/08/2005  DATE OF DISCHARGE:                                   PROGRESS NOTE   SUBJECTIVE:  He was at church yesterday sitting down in a pew when he began  to feel a little bit weak, and when he went to stand up, he felt so weak  when he stood up he had to sit down again. He had a couple of pieces of  toast in the morning, a little bit of jelly on them. He was brought to the  ER. He did have a CABG about three years ago and is due to see cardiology  today.   OBJECTIVE:  VITAL SIGNS:  Temperature 98.4, pulse 51 (54 and 44),  respiratory rate 20, blood pressure 150/70.  GENERAL:  He is oriented, alert, no acute distress currently, well-  developed, somewhat obese.  HEART:  Regular rhythm with a normal rate of 70.  LUNGS:  Clear and moving air well.  ABDOMEN:  Soft without hepatosplenomegaly or mass or tenderness.  EXTREMITIES:  There is no edema in ankles. Extremities normal.   Glucose has been 84, 132, and 86 in the last 24 hours.   His admission H and H was 12.7 and 35.7. His cardiac markers have all been  normal. His A1c is 5.7 and BMET essentially normal.   ASSESSMENT:  1.  This patient had an episode of weakness that may have been hypoglycemia,      orthostatic hypotension, or even a cardiac event. There is no evidence      of the latter at this point.  2.  Type 2 diabetes, well controlled (ICD-9 250.02).  3.  Coronary artery disease status post coronary artery bypass grafting.  4.  Mild dementia.  5.  Insomnia.   PLAN:  He is actually starving right now so going to fed him this morning.  Have cardiology evaluate him and do a stress Cardiolite tomorrow. Will put  him back on alprazolam 1 mg q.h.s. for  sleep. Will hold on his glipizide at  present. He is on enteric-coated aspirin 81 mg q.d. with Toprol-XL 50 mg  q.d., Zoloft 20 mg q.d., Glucophage 500 mg b.i.d., hydrochlorothiazide 25 mg  q.d., Cardizem 240 mg q.d., Lipitor 40 mg q.d.    SDK/MEDQ  D:  01/08/2005  T:  01/08/2005  Job:  [id number]

## 2011-04-06 NOTE — H&P (Signed)
NAME:  Mark Montoya, Mark Montoya NO.:  [id number]   MEDICAL RECORD NO.:  [id number]          PATIENT TYPE:  EMS   LOCATION:  ED                            FACILITY:  [facility]   PHYSICIAN:  Melvyn Novas, MDDATE OF BIRTH:  Jan 14, 1941   DATE OF ADMISSION:  01/07/2005  DATE OF DISCHARGE:  LH                                HISTORY & PHYSICAL   The patient is a 70 year old white male status post CABG x 3 three years ago  with a LIMA to the LAD without prior infarct.  He apparently was doing well  in normal state of health.  He was in church today and began experiencing  some dizziness followed by diaphoresis and nausea.  He specifically denied  chest pain.  He had a near syncopal episode in church.  He denied any  antecedent palpitations.  There was no syncopal episode, no seizures, no  orthopnea or PND.  He was seen and evaluated in the ER and found to have  some sinus bradycardia.  He is on beta blocker and Cardizem.  However, we  thought he may have a subacute ischemic component versus a vagal reaction in  church.  He is admitted for observation and probable adenosine Cardiolite  study in a.m.   PAST MEDICAL HISTORY:  1.  Aforementioned CABG x 3 with LIMA to LAD.  2.  Diabetes, type 2.  3.  Hypertension.  4.  TIA.  5.  Mild Alzheimer's dementia.  6.  Hyperlipidemia.   SOCIAL HISTORY:  The patient never smoked.  He is a retired Visual merchandiser, is  married.   CURRENT MEDICATIONS:  1.  Toprol XL 50.  2.  Cardizem 240.  3.  Lipitor 40.  4.  Glucophage 500 b.i.d.  5.  Xanax 0.5 t.i.d. p.r.n.  6.  Ambien 10 mg h.s.  7.  Glipizide ER 10 mg per day.  8.  Hydrochlorothiazide 50 mg per day.  9.  Aricept 10.  10. Minoxidil, unknown dosage.   PHYSICAL EXAMINATION:  VITAL SIGNS:  Blood pressure 151/84, pulse 59 and  regular, respiratory rate 20, temperature 97.8.  HEAD:  Normocephalic and atraumatic.  EYES:  PERRLA.  Extraocular movements intact.  Sclerae clear.   Conjunctivae  pink.  THROAT:  No erythema.  NECK:  No jugular venous distention, no carotid bruits, no thyromegaly, no  thyroid bruits.  HEART:  Regular rhythm, 1/6 aortic outflow murmur.  No S3, S4, gallops.  No  heaves, thrills, or rubs.  ABDOMEN:  Soft, nontender.  Bowel sounds normal.  No rebound or guarding.  No masses or organomegaly.  EXTREMITIES:  No clubbing, cyanosis, or edema.  NEUROLOGIC:  The patient follows all commands.  Cranial nerves II-XII  grossly intact.  The patient moves all four extremities.   IMPRESSION:  1.  Near syncopal episode.  2.  Consider vagal episode versus possible ischemic component.  3.  Diabetes.  4.  Coronary artery disease status post coronary artery bypass grafting.  5.  Hypertension.  6.  Hyperlipidemia.  7.  Mild Alzheimer's dementia.   PLAN:  1.  Admit.  2.  Place him on Lovenox 1 mg/kg q.12h.  3.  Aspirin 81 mg a day.  4.  Adenosine thallium in the morning.  5.  Serial cardiac enzymes q.8h. x 3.  6.  I will make further recommendations as the database expands.      RMD/MEDQ  D:  01/07/2005  T:  01/07/2005  Job:  [id number]

## 2011-04-06 NOTE — Procedures (Signed)
Mark Montoya, Mark Montoya                  ACCOUNT NO.:  [id number]   MEDICAL RECORD NO.:  [id number]          PATIENT TYPE:  INP   LOCATION:  A225                          FACILITY:  [facility]   PHYSICIAN:  Dani Gobble, MD       DATE OF BIRTH:  11/14/1941   DATE OF PROCEDURE:  01/09/2005  DATE OF DISCHARGE:                                    STRESS TEST   REFERRING PHYSICIAN:  [name], M.D.   INDICATIONS FOR PROCEDURE:  [name] is a very pleasant, 70 year old  gentleman who experienced a presyncopal episode while in church on Sunday.  Of note, his heart rate was documented at 38 beats per minute at the point  in time with a glucose of 117.  He is referred to assess for the possibility  of coronary ischemia as the etiology of this episode.   HEMODYNAMIC/ELECTROCARDIOGRAPHIC DATA:  Baseline blood pressure 190/90 mmHg  with a pulse of 64 beats per minute.  EKG revealed normal sinus rhythm with  nonspecific ST and T wave changes particularly in the inferior leads.   Persantine was infused per protocol.  He experienced symptoms of discomfort  in his abdomen and in his posterolateral neck.  He also experienced some  nausea, but no emesis.  The EKG was notable for approximately 1 mm,  horizontal to down sloping ST depression inferiorly.  This did resolve with  IV Aminophyllin per protocol as well as one sublingual nitroglycerin.  Blood  pressure remained stable throughout the procedure.   IMPRESSION:  1.  Clinically positive for angina.  2.  Electrocardiogram positive for ischemia.  3.  Images are pending.      AB/MEDQ  D:  01/09/2005  T:  01/09/2005  Job:  [id number]

## 2011-04-25 ENCOUNTER — Ambulatory Visit (HOSPITAL_COMMUNITY)
Admission: RE | Admit: 2011-04-25 | Discharge: 2011-04-25 | Disposition: A | Discharge status: Home / Self Care | Payer: Medicare Other | Source: Ambulatory Visit | Attending: Internal Medicine | Admitting: Internal Medicine

## 2011-04-25 ENCOUNTER — Encounter (HOSPITAL_BASED_OUTPATIENT_CLINIC_OR_DEPARTMENT_OTHER): Payer: Medicare Other | Admitting: Internal Medicine

## 2011-04-25 DIAGNOSIS — K644 Residual hemorrhoidal skin tags: Secondary | ICD-10-CM

## 2011-04-25 DIAGNOSIS — E785 Hyperlipidemia, unspecified: Secondary | ICD-10-CM | POA: Insufficient documentation

## 2011-04-25 DIAGNOSIS — Z7982 Long term (current) use of aspirin: Secondary | ICD-10-CM | POA: Insufficient documentation

## 2011-04-25 DIAGNOSIS — Z79899 Other long term (current) drug therapy: Secondary | ICD-10-CM | POA: Insufficient documentation

## 2011-04-25 DIAGNOSIS — Z951 Presence of aortocoronary bypass graft: Secondary | ICD-10-CM | POA: Insufficient documentation

## 2011-04-25 DIAGNOSIS — Z1211 Encounter for screening for malignant neoplasm of colon: Secondary | ICD-10-CM

## 2011-04-25 DIAGNOSIS — K5732 Diverticulitis of large intestine without perforation or abscess without bleeding: Secondary | ICD-10-CM

## 2011-04-25 DIAGNOSIS — I10 Essential (primary) hypertension: Secondary | ICD-10-CM | POA: Insufficient documentation

## 2011-04-25 DIAGNOSIS — E119 Type 2 diabetes mellitus without complications: Secondary | ICD-10-CM | POA: Insufficient documentation

## 2011-04-25 DIAGNOSIS — K5730 Diverticulosis of large intestine without perforation or abscess without bleeding: Secondary | ICD-10-CM | POA: Insufficient documentation

## 2011-05-20 NOTE — Op Note (Signed)
  NAMEABDULAH, Mark Montoya                  ACCOUNT NO.:  [id number]  MEDICAL RECORD NO.:  [id number]  LOCATION:  DAYP                          FACILITY:  [facility]  PHYSICIAN:  Lionel December, M.D.    DATE OF BIRTH:  October 24, 1941  DATE OF PROCEDURE:  04/25/2011 DATE OF DISCHARGE:                              OPERATIVE REPORT   PROCEDURE:  Colonoscopy.  INDICATIONS:  Mark Montoya is a 70 year old Caucasian male who is undergoing average risk screening colonoscopy.  His last exam was back in July 2002.  Procedure and risks were reviewed with the patient and informed consent was obtained.  MEDICATIONS FOR CONSCIOUS SEDATION: 1. Demerol 50 mg IV. 2. Versed 5 mg IV.  FINDINGS:  Procedure performed in endoscopy suite.  The patient's vital signs and O2 sats were monitored during the procedure and remained stable.  The patient was placed in left lateral recumbent position and rectal examination performed.  No abnormality noted on external or digital exam.  Pentax videoscope was placed in rectum and advanced under vision into sigmoid colon and beyond.  Preparation was satisfactory. Redundant colon with scattered diverticula at sigmoid colon.  Scope was passed into cecum which was identified by appendiceal orifice and ileocecal valve.  Pictures were taken for the record.  As the scope was withdrawn, colonic mucosa was carefully examined.  There were no polyps and/or tumor masses.  Rectal mucosa was normal.  Scope was retroflexed to examine anorectal junction and small hemorrhoids noted below the dentate line along with thickened anoderm.  Pictures were taken for the record.  Endoscope was withdrawn.  Withdrawal time was 14 minutes.  The patient tolerated the procedure well.  FINAL DIAGNOSES: 1. Examination performed to cecum. 2. Redundant colon with scattered diverticula at sigmoid colon and     external hemorrhoids. 3. No evidence of colonic polyps.  RECOMMENDATIONS: 1. He will resume his usual meds  including aspirin. 2. High-fiber diet. 3. May consider an exam in 10 years from now.          ______________________________ Lionel December, M.D.     NR/MEDQ  D:  04/25/2011  T:  04/25/2011  Job:  [id number]  cc:   [name], M.D. Fax: [phone]  Electronically Signed by Lionel December M.D. on 05/20/2011 12:50:49 PM

## 2011-08-29 LAB — BASIC METABOLIC PANEL
BUN: 13
BUN: 15
BUN: 15
BUN: 15
CO2: 24
CO2: 25
CO2: 27
CO2: 28
CO2: 28
CO2: 28
Calcium: 8.6
Chloride: 102
Chloride: 103
Chloride: 104
Chloride: 106
Chloride: 107
Chloride: 109
Creatinine, Ser: 0.98
Creatinine, Ser: 1.02
GFR calc Af Amer: >60
GFR calc Af Amer: >60
GFR calc Af Amer: >60
GFR calc Af Amer: >60
GFR calc non Af Amer: >60
GFR calc non Af Amer: >60
Glucose, Bld: 109 — ABNORMAL HIGH
Glucose, Bld: 115 — ABNORMAL HIGH
Glucose, Bld: 139 — ABNORMAL HIGH
Glucose, Bld: 163 — ABNORMAL HIGH
Potassium: 2.9 — ABNORMAL LOW
Potassium: 3.4 — ABNORMAL LOW
Potassium: 3.7
Sodium: 137
Sodium: 139
Sodium: 140
Sodium: 140

## 2011-08-29 LAB — CBC
HCT: 33 — ABNORMAL LOW
HCT: 34 — ABNORMAL LOW
HCT: 34.6 — ABNORMAL LOW
HCT: 34.7 — ABNORMAL LOW
Hemoglobin: 11.5 — ABNORMAL LOW
Hemoglobin: 11.7 — ABNORMAL LOW
MCHC: 33.8
MCHC: 33.8
MCHC: 33.9
MCHC: 34.1
MCHC: 34.3
MCV: 84.8
MCV: 85.9
MCV: 86.1
Platelets: 138 — ABNORMAL LOW
Platelets: 147 — ABNORMAL LOW
Platelets: 175
RBC: 3.83 — ABNORMAL LOW
RBC: 3.92 — ABNORMAL LOW
RDW: 14
RDW: 14
RDW: 14.1 — ABNORMAL HIGH
WBC: 5.3
WBC: 6.9
WBC: 9.1

## 2011-08-30 LAB — URINALYSIS, ROUTINE W REFLEX MICROSCOPIC
Leukocytes, UA: NEGATIVE
Nitrite: NEGATIVE
Specific Gravity, Urine: 1.016
pH: 8

## 2011-08-30 LAB — URINE MICROSCOPIC-ADD ON

## 2011-08-30 LAB — COMPREHENSIVE METABOLIC PANEL
ALT: 19
AST: 24
Albumin: 4.1
Chloride: 105
Creatinine, Ser: 0.99
GFR calc Af Amer: >60
Sodium: 139
Total Bilirubin: 1.3 — ABNORMAL HIGH

## 2011-08-30 LAB — DIFFERENTIAL
Basophils Absolute: 0
Eosinophils Relative: 0
Lymphocytes Relative: 10 — ABNORMAL LOW
Lymphs Abs: 0.9
Monocytes Absolute: 0.3

## 2011-08-30 LAB — POCT CARDIAC MARKERS: Troponin i, poc: <0.05

## 2011-08-30 LAB — CBC
MCV: 86.6
Platelets: 140 — ABNORMAL LOW
WBC: 9

## 2013-04-24 ENCOUNTER — Ambulatory Visit: Payer: Medicare Other | Admitting: Cardiovascular Disease

## 2013-05-05 ENCOUNTER — Encounter: Payer: Self-pay | Admitting: Cardiovascular Disease

## 2013-05-06 ENCOUNTER — Ambulatory Visit (INDEPENDENT_AMBULATORY_CARE_PROVIDER_SITE_OTHER): Payer: Medicare Other | Admitting: Cardiovascular Disease

## 2013-05-06 ENCOUNTER — Encounter: Payer: Self-pay | Admitting: Cardiovascular Disease

## 2013-05-06 VITALS — BP 154/88 | HR 63 | Ht 70.0 in | Wt 189.5 lb

## 2013-05-06 DIAGNOSIS — I2510 Atherosclerotic heart disease of native coronary artery without angina pectoris: Secondary | ICD-10-CM

## 2013-05-06 DIAGNOSIS — E785 Hyperlipidemia, unspecified: Secondary | ICD-10-CM

## 2013-05-06 DIAGNOSIS — I639 Cerebral infarction, unspecified: Secondary | ICD-10-CM | POA: Insufficient documentation

## 2013-05-06 DIAGNOSIS — E119 Type 2 diabetes mellitus without complications: Secondary | ICD-10-CM | POA: Insufficient documentation

## 2013-05-06 DIAGNOSIS — I10 Essential (primary) hypertension: Secondary | ICD-10-CM

## 2013-05-06 DIAGNOSIS — I6350 Cerebral infarction due to unspecified occlusion or stenosis of unspecified cerebral artery: Secondary | ICD-10-CM

## 2013-05-06 NOTE — Assessment & Plan Note (Signed)
On statin therapy followed by his primary care physician 

## 2013-05-06 NOTE — Assessment & Plan Note (Signed)
Controlled on current medications 

## 2013-05-06 NOTE — Assessment & Plan Note (Addendum)
Patient had coronary artery bypass grafting x5 by Dr. Andrey Spearman 02/03/02. He had a LIMA to his LAD, vein grafts to the first and second diagonal branches sequentially, obtuse marginal branch, and PDA. His last stress test performed 01/28/07 was nonischemic. The patient denies chest pain or shortness of breath.given the fact that he is now 11 years post bypass grafting and is a diabetic, and has not had a stress test in the last 6 years undergoing to order a Lexiscan Myoview stress test

## 2013-05-06 NOTE — Progress Notes (Signed)
05/06/2013 Mark Montoya   December 24, 1940  [id number]  Primary Physician Milana Obey, MD Primary Cardiologist: Runell Gess MD Mark Montoya   HPI:  Mr. Is a 72 year old married Caucasian male accompanied by his wife today. He is the father of one, grandfather to 3 grandchildren. I last saw him in the office 02/07/06. His past history is her markable for having undergone coronary artery bypass grafting x5 by Dr. Andrey Spearman 02/03/02. He had LIMA to his LAD, sequential vein graft to the first and second diagonal branches as well as to the obtuse marginal branch and PDA. Sometimes include hypertension, hyperlipidemia, and other requiring diabetes. He did have a stroke approximately 5 years ago which has left him with some neurologic deficits mostly regarding ambulation. He denies chest pain or shortness of breath.   Current Outpatient Prescriptions  Medication Sig Dispense Refill  . ALPRAZolam (XANAX) 0.5 MG tablet 0.5 mg. Take 1 tablet in the morning and 2 tablets in the evening.      Marland Kitchen amLODipine (NORVASC) 10 MG tablet Take 10 mg by mouth daily.      Marland Kitchen aspirin 81 MG tablet Take 81 mg by mouth daily.      . cloNIDine (CATAPRES) 0.1 MG tablet Take 0.2 mg by mouth 4 (four) times daily.       . CRESTOR 10 MG tablet Take 10 mg by mouth daily.      . DiphenhydrAMINE HCl (BENADRYL PO) Take 2 tablets by mouth at bedtime.      . donepezil (ARICEPT) 10 MG tablet Take 10 mg by mouth at bedtime.      . ferrous sulfate 325 (65 FE) MG tablet Take 325 mg by mouth daily with breakfast.      . furosemide (LASIX) 40 MG tablet Take 40 mg by mouth daily.      Marland Kitchen glipiZIDE (GLUCOTROL) 10 MG tablet Take 10 mg by mouth daily.      Marland Kitchen lisinopril (PRINIVIL,ZESTRIL) 40 MG tablet Take 40 mg by mouth 2 (two) times daily.      . metFORMIN (GLUCOPHAGE) 500 MG tablet 1,000 mg 2 (two) times daily. Take 2 tablets (1,000 mg) in the morning and 1 tablet (500 mg) in the evening.      . Multiple Vitamin  (MULTIVITAMIN) tablet Take 1 tablet by mouth daily.      . potassium chloride SA (K-DUR,KLOR-CON) 20 MEQ tablet Take 20 mEq by mouth daily.      . sertraline (ZOLOFT) 100 MG tablet Take 100 mg by mouth daily.      . vitamin C (ASCORBIC ACID) 500 MG tablet Take 500 mg by mouth daily.       No current facility-administered medications for this visit.    Allergies  Allergen Reactions  . Contrast Media (Iodinated Diagnostic Agents)     History   Social History  . Marital Status: Married    Spouse Name: N/A    Number of Children: N/A  . Years of Education: N/A   Occupational History  . Not on file.   Social History Main Topics  . Smoking status: Never Smoker   . Smokeless tobacco: Current User    Types: Chew  . Alcohol Use: No  . Drug Use: No  . Sexually Active: Not on file   Other Topics Concern  . Not on file   Social History Narrative  . No narrative on file     Review of Systems: General: negative for chills, fever, night sweats  or weight changes.  Cardiovascular: negative for chest pain, dyspnea on exertion, edema, orthopnea, palpitations, paroxysmal nocturnal dyspnea or shortness of breath Dermatological: negative for rash Respiratory: negative for cough or wheezing Urologic: negative for hematuria Abdominal: negative for nausea, vomiting, diarrhea, bright red blood per rectum, melena, or hematemesis Neurologic: negative for visual changes, syncope, or dizziness All other systems reviewed and are otherwise negative except as noted above.    Blood pressure 154/88, pulse 63, height 5\' 10"  (1.778 m), weight 189 lb 8 oz (85.957 kg).  General appearance: alert and no distress Neck: no adenopathy, no carotid bruit, no JVD, supple, symmetrical, trachea midline and thyroid not enlarged, symmetric, no tenderness/mass/nodules Lungs: clear to auscultation bilaterally Heart: regular rate and rhythm, S1, S2 normal, no murmur, click, rub or gallop Abdomen: soft, non-tender;  bowel sounds normal; no masses,  no organomegaly Extremities: extremities normal, atraumatic, no cyanosis or edema Pulses: 2+ and symmetric  EKG normal sinus rhythm at 63 with with nonspecific ST and T wave changes  ASSESSMENT AND PLAN:   Coronary artery disease Patient had coronary artery bypass grafting x5 by Dr. Andrey Spearman 02/03/02. He had a LIMA to his LAD, vein grafts to the first and second diagonal branches sequentially, obtuse marginal branch, and PDA. His last stress test performed 01/28/07 was nonischemic. The patient denies chest pain or shortness of breath.given the fact that he is now 11 years post bypass grafting and is a diabetic, and has not had a stress test in the last 6 years undergoing to order a Lexiscan Myoview stress test  Essential hypertension Controlled on current medications  Hyperlipidemia On statin therapy followed by his primary care physician      Runell Gess MD [facility], [facility] 05/06/2013 12:42 PM

## 2013-05-06 NOTE — Patient Instructions (Addendum)
Your physician wants you to follow-up in: 12 months with Dr Berry. You will receive a reminder letter in the mail two months in advance. If you don't receive a letter, please call our office to schedule the follow-up appointment.  

## 2013-05-11 ENCOUNTER — Other Ambulatory Visit (HOSPITAL_COMMUNITY): Payer: Self-pay | Admitting: Family Medicine

## 2013-05-11 ENCOUNTER — Ambulatory Visit (HOSPITAL_COMMUNITY)
Admission: RE | Admit: 2013-05-11 | Discharge: 2013-05-11 | Disposition: A | Discharge status: Home / Self Care | Payer: Medicare Other | Source: Ambulatory Visit | Attending: Family Medicine | Admitting: Family Medicine

## 2013-05-11 DIAGNOSIS — R5381 Other malaise: Secondary | ICD-10-CM | POA: Insufficient documentation

## 2013-05-11 DIAGNOSIS — R5383 Other fatigue: Secondary | ICD-10-CM | POA: Insufficient documentation

## 2013-05-11 DIAGNOSIS — Z951 Presence of aortocoronary bypass graft: Secondary | ICD-10-CM | POA: Insufficient documentation

## 2013-05-11 DIAGNOSIS — E119 Type 2 diabetes mellitus without complications: Secondary | ICD-10-CM | POA: Insufficient documentation

## 2013-05-13 ENCOUNTER — Encounter (HOSPITAL_COMMUNITY): Payer: Medicare Other

## 2013-06-09 ENCOUNTER — Ambulatory Visit (HOSPITAL_COMMUNITY)
Admission: RE | Admit: 2013-06-09 | Discharge: 2013-06-09 | Disposition: A | Discharge status: Home / Self Care | Payer: Medicare Other | Source: Ambulatory Visit | Attending: Cardiology | Admitting: Cardiology

## 2013-06-09 DIAGNOSIS — Z8673 Personal history of transient ischemic attack (TIA), and cerebral infarction without residual deficits: Secondary | ICD-10-CM | POA: Insufficient documentation

## 2013-06-09 DIAGNOSIS — Z87891 Personal history of nicotine dependence: Secondary | ICD-10-CM | POA: Insufficient documentation

## 2013-06-09 DIAGNOSIS — I2510 Atherosclerotic heart disease of native coronary artery without angina pectoris: Secondary | ICD-10-CM

## 2013-06-09 DIAGNOSIS — E669 Obesity, unspecified: Secondary | ICD-10-CM | POA: Insufficient documentation

## 2013-06-09 DIAGNOSIS — E119 Type 2 diabetes mellitus without complications: Secondary | ICD-10-CM | POA: Insufficient documentation

## 2013-06-09 DIAGNOSIS — Z8249 Family history of ischemic heart disease and other diseases of the circulatory system: Secondary | ICD-10-CM | POA: Insufficient documentation

## 2013-06-09 DIAGNOSIS — I739 Peripheral vascular disease, unspecified: Secondary | ICD-10-CM | POA: Insufficient documentation

## 2013-06-09 DIAGNOSIS — I10 Essential (primary) hypertension: Secondary | ICD-10-CM | POA: Insufficient documentation

## 2013-06-09 DIAGNOSIS — R5381 Other malaise: Secondary | ICD-10-CM | POA: Insufficient documentation

## 2013-06-09 DIAGNOSIS — R5383 Other fatigue: Secondary | ICD-10-CM | POA: Insufficient documentation

## 2013-06-09 MED ORDER — TECHNETIUM TC 99M SESTAMIBI GENERIC - CARDIOLITE
30.0000 | Freq: Once | INTRAVENOUS | Status: AC | PRN
  Administered 2013-06-09: 30 via INTRAVENOUS

## 2013-06-09 MED ORDER — TECHNETIUM TC 99M SESTAMIBI GENERIC - CARDIOLITE
10.0000 | Freq: Once | INTRAVENOUS | Status: AC | PRN
  Administered 2013-06-09: 10 via INTRAVENOUS

## 2013-06-09 MED ORDER — REGADENOSON 0.4 MG/5ML IV SOLN
0.4000 mg | Freq: Once | INTRAVENOUS | Status: AC
  Administered 2013-06-09: 0.4 mg via INTRAVENOUS

## 2013-06-09 NOTE — Procedures (Addendum)
[facility] [address] [place] [address] [phone]  Cardiology Nuclear Med Study  Mark Montoya is a 72 y.o. male     MRN : [id number]     DOB: Feb 01, 1941  Procedure Date: 06/09/2013  Nuclear Med Background Indication for Stress Test:  Graft Patency and Abnormal EKG History:  CABG x5 2003 Cardiac Risk Factors: CVA, Family History - CAD, History of Smoking, Hypertension, Lipids, NIDDM, Overweight and PVD  Symptoms:  Fatigue   Nuclear Pre-Procedure Caffeine/Decaff Intake:  10:00pm NPO After: 8:00am   IV Site: R Antecubital  IV 0.9% NS with Angio Cath:  22g  Chest Size (in):  42 IV Started by: Emmit Pomfret, RN  Height: 5\' 10"  (1.778 m)  Cup Size: n/a  BMI:  Body mass index is 27.12 kg/(m^2). Weight:  189 lb (85.73 kg)   Tech Comments:  n/a    Nuclear Med Study 1 or 2 day study: 1 day  Stress Test Type:  Lexiscan  Order Authorizing Provider:  Nanetta Batty, MD   Resting Radionuclide: Technetium 50m Sestamibi  Resting Radionuclide Dose: 10.4 mCi   Stress Radionuclide:  Technetium 64m Sestamibi  Stress Radionuclide Dose: 31.6 mCi           Stress Protocol Rest HR:55 Stress HR: 88  Rest BP:131/72 Stress BP: 143/76  Exercise Time (min): n/a METS: n/a          Dose of Adenosine (mg):  n/a Dose of Lexiscan: 0.4 mg  Dose of Atropine (mg): n/a Dose of Dobutamine: n/a mcg/kg/min (at max HR)  Stress Test Technologist: Ernestene Mention, CCT Nuclear Technologist: Koren Shiver, CNMT   Rest Procedure:  Myocardial perfusion imaging was performed at rest 45 minutes following the intravenous administration of Technetium 25m Sestamibi. Stress Procedure:  The patient received IV Lexiscan 0.4 mg over 15-seconds.  Technetium 65m Sestamibi injected at 30-seconds.  There were no significant changes with Lexiscan.  Quantitative spect images were obtained after a 45 minute delay.  Transient Ischemic Dilatation (Normal <1.22):   0.91 Lung/Heart Ratio (Normal <0.45):  0.28 QGS EDV:  110 ml QGS ESV:  47 ml LV Ejection Fraction: 57%  Signed by Koren Shiver, CNMT  PHYSICIAN INTERPRETATION  Rest ECG: NSR with non-specific ST-T wave changes  Stress ECG: No significant change from baseline ECG and No significant ST segment change suggestive of ischemia.  QPS Raw Data Images:  Acquisition technically good; normal left ventricular size. Stress Images:  There is decreased uptake in the inferior wall.   There is decreased uptake in the septum.  A small to medium sized, moderate intensity fixed perfusion defect is noted in the basal to mid inferior & inferoseptal wall.  Minimal reversibilty. Rest Images:  Comparison with the stress images reveals no significant change.  There is decreased uptake in the inferior wall.  There is decreased uptake in the septum. Subtraction (SDS):  There is a fixed defect that is most consistent with a previous infarction.  No reversibility is appreciated.  There is no evidence of scar or ischemia.  Impression Exercise Capacity:  Lexiscan with no exercise. BP Response:  Normal blood pressure response. Clinical Symptoms:  No significant symptoms noted. Dizzy ECG Impression:  No significant ECG changes with Lexiscan. Comparison with Prior Nuclear Study: No images to compare  Overall Impression:  Low risk stress nuclear study with a fixed inferoseptal defect consistent with prior infarction..  LV Wall Motion:  NL LV Function; Wall Motion suggests  mild hypokinesis in the inferior & and inferoseptal wall    [name], MD  06/09/2013 6:00 PM

## 2013-10-29 ENCOUNTER — Emergency Department (HOSPITAL_COMMUNITY): Payer: No Typology Code available for payment source

## 2013-10-29 ENCOUNTER — Encounter (HOSPITAL_COMMUNITY): Payer: Self-pay | Admitting: Emergency Medicine

## 2013-10-29 ENCOUNTER — Inpatient Hospital Stay (HOSPITAL_COMMUNITY): Payer: No Typology Code available for payment source

## 2013-10-29 ENCOUNTER — Inpatient Hospital Stay (HOSPITAL_COMMUNITY)
Admission: EM | Admit: 2013-10-29 | Discharge: 2013-11-04 | DRG: 493 | Disposition: A | Discharge status: Skilled Nursing Facility | Payer: No Typology Code available for payment source | Attending: General Surgery | Admitting: General Surgery

## 2013-10-29 DIAGNOSIS — I498 Other specified cardiac arrhythmias: Secondary | ICD-10-CM | POA: Diagnosis present

## 2013-10-29 DIAGNOSIS — K029 Dental caries, unspecified: Secondary | ICD-10-CM | POA: Diagnosis present

## 2013-10-29 DIAGNOSIS — K5900 Constipation, unspecified: Secondary | ICD-10-CM | POA: Diagnosis not present

## 2013-10-29 DIAGNOSIS — Z8249 Family history of ischemic heart disease and other diseases of the circulatory system: Secondary | ICD-10-CM

## 2013-10-29 DIAGNOSIS — Z8673 Personal history of transient ischemic attack (TIA), and cerebral infarction without residual deficits: Secondary | ICD-10-CM

## 2013-10-29 DIAGNOSIS — D62 Acute posthemorrhagic anemia: Secondary | ICD-10-CM | POA: Diagnosis not present

## 2013-10-29 DIAGNOSIS — S82109A Unspecified fracture of upper end of unspecified tibia, initial encounter for closed fracture: Principal | ICD-10-CM | POA: Diagnosis present

## 2013-10-29 DIAGNOSIS — F17200 Nicotine dependence, unspecified, uncomplicated: Secondary | ICD-10-CM | POA: Diagnosis present

## 2013-10-29 DIAGNOSIS — F0390 Unspecified dementia without behavioral disturbance: Secondary | ICD-10-CM | POA: Diagnosis present

## 2013-10-29 DIAGNOSIS — Z951 Presence of aortocoronary bypass graft: Secondary | ICD-10-CM

## 2013-10-29 DIAGNOSIS — I2510 Atherosclerotic heart disease of native coronary artery without angina pectoris: Secondary | ICD-10-CM | POA: Diagnosis present

## 2013-10-29 DIAGNOSIS — T50995A Adverse effect of other drugs, medicaments and biological substances, initial encounter: Secondary | ICD-10-CM | POA: Diagnosis not present

## 2013-10-29 DIAGNOSIS — N179 Acute kidney failure, unspecified: Secondary | ICD-10-CM | POA: Diagnosis not present

## 2013-10-29 DIAGNOSIS — I10 Essential (primary) hypertension: Secondary | ICD-10-CM | POA: Diagnosis present

## 2013-10-29 DIAGNOSIS — S1093XA Contusion of unspecified part of neck, initial encounter: Secondary | ICD-10-CM

## 2013-10-29 DIAGNOSIS — S00431A Contusion of right ear, initial encounter: Secondary | ICD-10-CM

## 2013-10-29 DIAGNOSIS — S0003XA Contusion of scalp, initial encounter: Secondary | ICD-10-CM | POA: Diagnosis present

## 2013-10-29 DIAGNOSIS — K0530 Chronic periodontitis, unspecified: Secondary | ICD-10-CM | POA: Diagnosis present

## 2013-10-29 DIAGNOSIS — S0083XA Contusion of other part of head, initial encounter: Secondary | ICD-10-CM

## 2013-10-29 DIAGNOSIS — S82141A Displaced bicondylar fracture of right tibia, initial encounter for closed fracture: Secondary | ICD-10-CM | POA: Diagnosis present

## 2013-10-29 DIAGNOSIS — E785 Hyperlipidemia, unspecified: Secondary | ICD-10-CM | POA: Diagnosis present

## 2013-10-29 DIAGNOSIS — M25469 Effusion, unspecified knee: Secondary | ICD-10-CM | POA: Diagnosis present

## 2013-10-29 DIAGNOSIS — Z7982 Long term (current) use of aspirin: Secondary | ICD-10-CM

## 2013-10-29 DIAGNOSIS — I639 Cerebral infarction, unspecified: Secondary | ICD-10-CM | POA: Diagnosis present

## 2013-10-29 DIAGNOSIS — R339 Retention of urine, unspecified: Secondary | ICD-10-CM | POA: Diagnosis not present

## 2013-10-29 DIAGNOSIS — Z01810 Encounter for preprocedural cardiovascular examination: Secondary | ICD-10-CM

## 2013-10-29 DIAGNOSIS — Z833 Family history of diabetes mellitus: Secondary | ICD-10-CM

## 2013-10-29 DIAGNOSIS — S82209A Unspecified fracture of shaft of unspecified tibia, initial encounter for closed fracture: Secondary | ICD-10-CM

## 2013-10-29 DIAGNOSIS — N39490 Overflow incontinence: Secondary | ICD-10-CM | POA: Diagnosis not present

## 2013-10-29 DIAGNOSIS — E119 Type 2 diabetes mellitus without complications: Secondary | ICD-10-CM | POA: Diagnosis present

## 2013-10-29 DIAGNOSIS — D696 Thrombocytopenia, unspecified: Secondary | ICD-10-CM | POA: Diagnosis not present

## 2013-10-29 DIAGNOSIS — Z79899 Other long term (current) drug therapy: Secondary | ICD-10-CM

## 2013-10-29 DIAGNOSIS — K089 Disorder of teeth and supporting structures, unspecified: Secondary | ICD-10-CM | POA: Diagnosis present

## 2013-10-29 LAB — COMPREHENSIVE METABOLIC PANEL
ALT: 15 U/L (ref 0–53)
AST: 21 U/L (ref 0–37)
Albumin: 4.4 g/dL (ref 3.5–5.2)
CO2: 27 mEq/L (ref 19–32)
Calcium: 9.1 mg/dL (ref 8.4–10.5)
Sodium: 143 mEq/L (ref 135–145)
Total Protein: 7.4 g/dL (ref 6.0–8.3)

## 2013-10-29 LAB — CBC
HCT: 40.2 % (ref 39.0–52.0)
Hemoglobin: 13.5 g/dL (ref 13.0–17.0)
MCH: 30.1 pg (ref 26.0–34.0)
MCHC: 33.6 g/dL (ref 30.0–36.0)
MCV: 89.5 fL (ref 78.0–100.0)
RBC: 4.49 MIL/uL (ref 4.22–5.81)

## 2013-10-29 LAB — POCT I-STAT, CHEM 8
Chloride: 104 mEq/L (ref 96–112)
HCT: 41 % (ref 39.0–52.0)
Hemoglobin: 13.9 g/dL (ref 13.0–17.0)
Potassium: 3.7 mEq/L (ref 3.5–5.1)
Sodium: 144 mEq/L (ref 135–145)

## 2013-10-29 LAB — CG4 I-STAT (LACTIC ACID)
Lactic Acid, Venous: 3.34 mmol/L — ABNORMAL HIGH (ref 0.5–2.2)
Lactic Acid, Venous: 2.46 mmol/L — ABNORMAL HIGH (ref 0.5–2.2)

## 2013-10-29 LAB — SAMPLE TO BLOOD BANK

## 2013-10-29 LAB — POCT I-STAT TROPONIN I: Troponin i, poc: 0 ng/mL (ref 0.00–0.08)

## 2013-10-29 MED ORDER — FUROSEMIDE 40 MG PO TABS
40.0000 mg | ORAL_TABLET | Freq: Every day | ORAL | Status: DC
  Administered 2013-10-29 – 2013-11-04 (×6): 40 mg via ORAL
  Filled 2013-10-29 (×7): qty 1

## 2013-10-29 MED ORDER — POTASSIUM CHLORIDE CRYS ER 20 MEQ PO TBCR
20.0000 meq | EXTENDED_RELEASE_TABLET | Freq: Every day | ORAL | Status: DC
  Administered 2013-10-29 – 2013-11-04 (×6): 20 meq via ORAL
  Filled 2013-10-29 (×7): qty 1

## 2013-10-29 MED ORDER — OXYCODONE HCL 5 MG PO TABS
5.0000 mg | ORAL_TABLET | ORAL | Status: DC | PRN
  Administered 2013-10-29: 5 mg via ORAL
  Filled 2013-10-29: qty 1

## 2013-10-29 MED ORDER — SODIUM CHLORIDE 0.9 % IV BOLUS (SEPSIS)
500.0000 mL | Freq: Once | INTRAVENOUS | Status: AC
  Administered 2013-10-29: 500 mL via INTRAVENOUS

## 2013-10-29 MED ORDER — POTASSIUM CHLORIDE IN NACL 20-0.9 MEQ/L-% IV SOLN
INTRAVENOUS | Status: DC
  Administered 2013-10-29 – 2013-11-01 (×5): via INTRAVENOUS
  Filled 2013-10-29 (×11): qty 1000

## 2013-10-29 MED ORDER — ONDANSETRON HCL 4 MG/2ML IJ SOLN: 4.0000 mg | Freq: Four times a day (QID) | INTRAMUSCULAR | Status: DC | PRN

## 2013-10-29 MED ORDER — SERTRALINE HCL 100 MG PO TABS
100.0000 mg | ORAL_TABLET | Freq: Every day | ORAL | Status: DC
  Administered 2013-10-29 – 2013-11-04 (×6): 100 mg via ORAL
  Filled 2013-10-29 (×7): qty 1

## 2013-10-29 MED ORDER — CLONIDINE HCL 0.2 MG PO TABS
0.2000 mg | ORAL_TABLET | Freq: Four times a day (QID) | ORAL | Status: DC
  Administered 2013-10-29 – 2013-11-04 (×22): 0.2 mg via ORAL
  Filled 2013-10-29 (×25): qty 1

## 2013-10-29 MED ORDER — ONDANSETRON HCL 4 MG PO TABS: 4.0000 mg | ORAL_TABLET | Freq: Four times a day (QID) | ORAL | Status: DC | PRN

## 2013-10-29 MED ORDER — GLIPIZIDE 10 MG PO TABS
10.0000 mg | ORAL_TABLET | Freq: Every day | ORAL | Status: DC
  Administered 2013-10-29 – 2013-11-04 (×6): 10 mg via ORAL
  Filled 2013-10-29 (×7): qty 1

## 2013-10-29 MED ORDER — HYDRALAZINE HCL 20 MG/ML IJ SOLN
10.0000 mg | Freq: Four times a day (QID) | INTRAMUSCULAR | Status: DC | PRN
  Administered 2013-11-01 – 2013-11-02 (×3): 10 mg via INTRAVENOUS
  Filled 2013-10-29 (×3): qty 1

## 2013-10-29 MED ORDER — HYDROMORPHONE HCL PF 1 MG/ML IJ SOLN: 0.5000 mg | INTRAMUSCULAR | Status: DC | PRN

## 2013-10-29 MED ORDER — ALPRAZOLAM 0.5 MG PO TABS
1.0000 mg | ORAL_TABLET | Freq: Every day | ORAL | Status: DC
  Administered 2013-10-29 – 2013-11-03 (×6): 1 mg via ORAL
  Filled 2013-10-29 (×6): qty 2

## 2013-10-29 MED ORDER — ENOXAPARIN SODIUM 40 MG/0.4ML ~~LOC~~ SOLN
40.0000 mg | SUBCUTANEOUS | Status: DC
  Administered 2013-10-30 – 2013-11-04 (×5): 40 mg via SUBCUTANEOUS
  Filled 2013-10-29 (×6): qty 0.4

## 2013-10-29 MED ORDER — PANTOPRAZOLE SODIUM 40 MG PO TBEC: 40.0000 mg | DELAYED_RELEASE_TABLET | Freq: Every day | ORAL | Status: DC

## 2013-10-29 MED ORDER — HYDRALAZINE HCL 20 MG/ML IJ SOLN
10.0000 mg | Freq: Once | INTRAMUSCULAR | Status: AC
  Administered 2013-10-29: 10 mg via INTRAVENOUS
  Filled 2013-10-29: qty 1

## 2013-10-29 MED ORDER — DONEPEZIL HCL 10 MG PO TABS
10.0000 mg | ORAL_TABLET | Freq: Every day | ORAL | Status: DC
  Administered 2013-10-29 – 2013-11-03 (×6): 10 mg via ORAL
  Filled 2013-10-29 (×7): qty 1

## 2013-10-29 MED ORDER — ACETAMINOPHEN 325 MG PO TABS
325.0000 mg | ORAL_TABLET | Freq: Four times a day (QID) | ORAL | Status: DC | PRN
  Administered 2013-10-31 – 2013-11-03 (×5): 650 mg via ORAL
  Filled 2013-10-29 (×5): qty 2

## 2013-10-29 MED ORDER — ALPRAZOLAM 0.5 MG PO TABS
0.5000 mg | ORAL_TABLET | Freq: Every day | ORAL | Status: DC
  Administered 2013-10-30 – 2013-11-04 (×5): 0.5 mg via ORAL
  Filled 2013-10-29 (×6): qty 1

## 2013-10-29 MED ORDER — AMLODIPINE BESYLATE 10 MG PO TABS
10.0000 mg | ORAL_TABLET | Freq: Every day | ORAL | Status: DC
  Administered 2013-10-29 – 2013-11-04 (×6): 10 mg via ORAL
  Filled 2013-10-29 (×7): qty 1

## 2013-10-29 MED ORDER — TETANUS-DIPHTHERIA TOXOIDS TD 5-2 LFU IM INJ
0.5000 mL | INJECTION | Freq: Once | INTRAMUSCULAR | Status: AC
Start: 2013-10-29 — End: 2013-10-29
  Administered 2013-10-29: 0.5 mL via INTRAMUSCULAR
  Filled 2013-10-29: qty 0.5

## 2013-10-29 MED ORDER — PANTOPRAZOLE SODIUM 40 MG IV SOLR
40.0000 mg | Freq: Every day | INTRAVENOUS | Status: DC
  Administered 2013-10-29: 40 mg via INTRAVENOUS
  Filled 2013-10-29 (×2): qty 40

## 2013-10-29 MED ORDER — LISINOPRIL 40 MG PO TABS
40.0000 mg | ORAL_TABLET | Freq: Two times a day (BID) | ORAL | Status: DC
  Administered 2013-10-29 – 2013-11-04 (×11): 40 mg via ORAL
  Filled 2013-10-29 (×13): qty 1
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
## 2013-10-29 NOTE — ED Notes (Signed)
Ortho tech at bedside placing soft dressing.
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
## 2013-10-29 NOTE — ED Notes (Signed)
R ear laceration bleeding, wrapped with pressure dressing. Vital signs stable. EDP at bedside to talk with patient regarding plan of care and admission. No signs of acute distress noted. Pt remains alert and oriented x4.

## 2013-10-29 NOTE — ED Notes (Signed)
CBG 238 per EMS
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
## 2013-10-29 NOTE — ED Notes (Signed)
Pt transported to xray 

## 2013-10-30 ENCOUNTER — Encounter (HOSPITAL_COMMUNITY): Payer: Self-pay | Admitting: Emergency Medicine

## 2013-10-30 DIAGNOSIS — K0530 Chronic periodontitis, unspecified: Secondary | ICD-10-CM

## 2013-10-30 DIAGNOSIS — E119 Type 2 diabetes mellitus without complications: Secondary | ICD-10-CM

## 2013-10-30 DIAGNOSIS — Z01810 Encounter for preprocedural cardiovascular examination: Secondary | ICD-10-CM

## 2013-10-30 DIAGNOSIS — S37009A Unspecified injury of unspecified kidney, initial encounter: Secondary | ICD-10-CM

## 2013-10-30 DIAGNOSIS — I2510 Atherosclerotic heart disease of native coronary artery without angina pectoris: Secondary | ICD-10-CM | POA: Diagnosis present

## 2013-10-30 DIAGNOSIS — K029 Dental caries, unspecified: Secondary | ICD-10-CM

## 2013-10-30 DIAGNOSIS — K036 Deposits [accretions] on teeth: Secondary | ICD-10-CM

## 2013-10-30 DIAGNOSIS — D62 Acute posthemorrhagic anemia: Secondary | ICD-10-CM

## 2013-10-30 DIAGNOSIS — I10 Essential (primary) hypertension: Secondary | ICD-10-CM

## 2013-10-30 DIAGNOSIS — K089 Disorder of teeth and supporting structures, unspecified: Secondary | ICD-10-CM | POA: Diagnosis present

## 2013-10-30 LAB — BASIC METABOLIC PANEL
CO2: 25 mEq/L (ref 19–32)
Calcium: 8.2 mg/dL — ABNORMAL LOW (ref 8.4–10.5)
Creatinine, Ser: 1.73 mg/dL — ABNORMAL HIGH (ref 0.50–1.35)
GFR calc non Af Amer: 37 mL/min — ABNORMAL LOW (ref >90)
Glucose, Bld: 114 mg/dL — ABNORMAL HIGH (ref 70–99)
Sodium: 143 mEq/L (ref 135–145)

## 2013-10-30 LAB — CBC
Hemoglobin: 10.9 g/dL — ABNORMAL LOW (ref 13.0–17.0)
MCH: 29.6 pg (ref 26.0–34.0)
MCHC: 33.4 g/dL (ref 30.0–36.0)
MCV: 88.6 fL (ref 78.0–100.0)
Platelets: DECREASED 10*3/uL (ref 150–400)

## 2013-10-30 LAB — GLUCOSE, CAPILLARY
Glucose-Capillary: 116 mg/dL — ABNORMAL HIGH (ref 70–99)
Glucose-Capillary: 162 mg/dL — ABNORMAL HIGH (ref 70–99)

## 2013-10-30 MED ORDER — FERROUS SULFATE 325 (65 FE) MG PO TABS
325.0000 mg | ORAL_TABLET | Freq: Every day | ORAL | Status: DC
  Administered 2013-10-30 – 2013-11-04 (×5): 325 mg via ORAL
  Filled 2013-10-30 (×7): qty 1

## 2013-10-30 MED ORDER — METOPROLOL TARTRATE 12.5 MG HALF TABLET
12.5000 mg | ORAL_TABLET | Freq: Two times a day (BID) | ORAL | Status: DC
  Administered 2013-10-30 – 2013-11-04 (×10): 12.5 mg via ORAL
  Filled 2013-10-30 (×12): qty 1

## 2013-10-30 MED ORDER — ATORVASTATIN CALCIUM 20 MG PO TABS
20.0000 mg | ORAL_TABLET | Freq: Every day | ORAL | Status: DC
  Administered 2013-10-30 – 2013-11-04 (×6): 20 mg via ORAL
  Filled 2013-10-30 (×7): qty 1

## 2013-10-30 MED ORDER — TRAMADOL HCL 50 MG PO TABS
50.0000 mg | ORAL_TABLET | Freq: Four times a day (QID) | ORAL | Status: DC | PRN
  Administered 2013-10-31 – 2013-11-04 (×3): 50 mg via ORAL
  Filled 2013-10-30 (×3): qty 1

## 2013-10-30 MED ORDER — HYDROMORPHONE HCL PF 1 MG/ML IJ SOLN
0.5000 mg | INTRAMUSCULAR | Status: DC | PRN
  Filled 2013-10-30: qty 1

## 2013-10-30 NOTE — Progress Notes (Signed)
Patient wants to go home.  He is stable with only orthopedic injury.  May DC for readmission next week.  This patient has been seen and I agree with the findings and treatment plan.  Marta Lamas. Gae Bon, MD, FACS [phone] (pager) [phone] (direct pager) Trauma Surgeon
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
## 2013-10-30 NOTE — Progress Notes (Signed)
Radiology contacted RN due to the fact that they were unable to do the imaging ordered. Pt is unable to stand up on fractured right tibia and is also unable to use the steady. Trauma MD on call paged and notified. No orders received. Will continue to monitor.
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
## 2013-10-30 NOTE — Plan of Care (Deleted)
Problem: Acute Rehab PT Goals(only PT should resolve) Goal: Pt Will Go Up/Down Stairs Family can demonstrate up/down stairs with WC

## 2013-10-30 NOTE — Progress Notes (Signed)
I have seen and examined the patient. I agree with the findings above.   Right Lower Extremity  Dressing C/D/I  Knee immobilizer fitting  DPN, SPN, TN sensation intact  EHL, FHL, AT, PT, peroneals, gastrocsoleus motor intact  No pain with passive stretch  Ext warm  + DP pulse   CT: Bicondylar tibial plateau frx with loss of posterior slope, minimal joint depression   Right tibial plateau, for ORIF on Mon or Tues, pending dental eval for extractions. Budd Palmer, MD 10/30/2013 11:14 AM
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
## 2013-10-30 NOTE — Progress Notes (Signed)
Called Radiology about doing orthopantogram xrays they stated someone would be up to get pt.

## 2013-10-30 NOTE — Progress Notes (Signed)
As per original note.  I have seen and examined the patient. I agree with the findings above.   Right Lower Extremity  Dressing C/D/I  Knee immobilizer fitting  DPN, SPN, TN sensation intact  EHL, FHL, AT, PT, peroneals, gastrocsoleus motor intact  No pain with passive stretch  Ext warm  + DP pulse   CT: Bicondylar tibial plateau frx with loss of posterior slope, minimal joint depression   Right tibial plateau, for ORIF on Mon or Tues, pending dental eval for extractions.   Budd Palmer, MD  10/30/2013  11:14 AM

## 2013-10-30 NOTE — Progress Notes (Signed)
UR completed.  Aryel Edelen, RN BSN [facility] CCM Trauma/Neuro ICU Case Manager [phone]  

## 2013-10-31 LAB — BASIC METABOLIC PANEL
CO2: 25 mEq/L (ref 19–32)
Chloride: 107 mEq/L (ref 96–112)
Creatinine, Ser: 1.75 mg/dL — ABNORMAL HIGH (ref 0.50–1.35)
GFR calc Af Amer: 42 mL/min — ABNORMAL LOW (ref >90)
Potassium: 3.8 mEq/L (ref 3.5–5.1)
Sodium: 141 mEq/L (ref 135–145)

## 2013-10-31 LAB — CBC
HCT: 29.1 % — ABNORMAL LOW (ref 39.0–52.0)
Hemoglobin: 9.6 g/dL — ABNORMAL LOW (ref 13.0–17.0)
MCHC: 33 g/dL (ref 30.0–36.0)
MCV: 89.3 fL (ref 78.0–100.0)
RBC: 3.26 MIL/uL — ABNORMAL LOW (ref 4.22–5.81)
RDW: 13.7 % (ref 11.5–15.5)
WBC: 8.1 10*3/uL (ref 4.0–10.5)

## 2013-10-31 LAB — GLUCOSE, CAPILLARY
Glucose-Capillary: 146 mg/dL — ABNORMAL HIGH (ref 70–99)
Glucose-Capillary: 158 mg/dL — ABNORMAL HIGH (ref 70–99)

## 2013-10-31 MED ORDER — CHLORHEXIDINE GLUCONATE 0.12 % MT SOLN
15.0000 mL | Freq: Two times a day (BID) | OROMUCOSAL | Status: DC
  Administered 2013-10-31 – 2013-11-04 (×7): 15 mL via OROMUCOSAL
  Filled 2013-10-31 (×10): qty 15

## 2013-10-31 MED ORDER — BIOTENE DRY MOUTH MT LIQD
15.0000 mL | Freq: Two times a day (BID) | OROMUCOSAL | Status: DC
  Administered 2013-11-01 – 2013-11-04 (×6): 15 mL via OROMUCOSAL
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
## 2013-10-31 NOTE — Progress Notes (Signed)
OT Cancellation Note  Patient Details Name: BRENNON OTTERNESS MRN: [id number] DOB: 1941/08/17   Cancelled Treatment:    Reason Eval/Treat Not Completed: Other (comment). Pt for surgery on leg on Monday (12/15) will wait and eval pt after this procedure, PT still working with pt until then.  Evette Georges [phone] 10/31/2013, 11:33 AM

## 2013-10-31 NOTE — Progress Notes (Signed)
Physical Therapy Treatment Patient Details Name: Mark Montoya MRN: [id number] DOB: 12/11/1940 Today's Date: 10/31/2013 Time: 1191-4782 PT Time Calculation (min): 25 min  PT Assessment / Plan / Recommendation     PT Comments   Slowly progressing toward goals. Still needing 2 person assist to mobilize safely out of bed.  Follow Up Recommendations  Supervision for mobility/OOB     Equipment Recommendations  Rolling walker with 5" wheels;Wheelchair (measurements PT)    Recommendations for Other Services OT consult  Frequency Min 5X/week   Progress towards PT Goals Progress towards PT goals: Progressing toward goals  Plan Current plan remains appropriate    Precautions / Restrictions Precautions Precautions: Fall Required Braces or Orthoses: Knee Immobilizer - Right Knee Immobilizer - Right: On at all times Restrictions Weight Bearing Restrictions: Yes RLE Weight Bearing: Non weight bearing    Mobility  Bed Mobility Supine to Sit: 3: Mod assist;HOB elevated;With rails Details for Bed Mobility Assistance: cues for technique and hand placement with getting to edge of bed. assist needed to manage right LE Transfers Sit to Stand: 1: +2 Total assist;From elevated surface;With upper extremity assist Sit to Stand: Patient Percentage: 60% Stand to Sit: 1: +2 Total assist;To chair/3-in-1;With upper extremity assist Stand to Sit: Patient Percentage: 40% Stand Pivot Transfers: 1: +2 Total assist Stand Pivot Transfers: Patient Percentage: 40% Details for Transfer Assistance: able to achieve standing on first attempt today with two person assist from elevated bed. once up pt was mod assist of one to stand with RW support. with cues and incr'd time pt did attempt to move left foot to pivot, unable to today, needed +2 person assist to transfer and sit safetly into recliner. Ambulation/Gait Ambulation/Gait Assistance: Not tested (comment)     PT Goals (current goals can now be found in  the care plan section) Acute Rehab PT Goals Patient Stated Goal: pt would like to go home PT Goal Formulation: With patient/family Time For Goal Achievement: 11/06/13 Potential to Achieve Goals: Good  Visit Information  Last PT Received On: 10/31/13 Assistance Needed: +2    Subjective Data  Patient Stated Goal: pt would like to go home   Cognition  Cognition Arousal/Alertness: Awake/alert Behavior During Therapy: WFL for tasks assessed/performed Overall Cognitive Status: Within Functional Limits for tasks assessed    End of Session PT - End of Session Equipment Utilized During Treatment: Gait belt;Right knee immobilizer Activity Tolerance: Patient tolerated treatment well Patient left: in chair;with nursing/sitter in room;with call bell/phone within reach Nurse Communication: Mobility status;Patient requests pain meds   GP     Sallyanne Kuster 10/31/2013, 12:47 PM   Sallyanne Kuster, PTA Office- [phone]

## 2013-10-31 NOTE — Progress Notes (Signed)
Clinical Social Work Department BRIEF PSYCHOSOCIAL ASSESSMENT 10/31/2013  Patient:  Mark Montoya, Mark Montoya     Account Number:  [id number]     Admit date:  10/29/2013  Clinical Social Worker:  Hadley Pen  Date/Time:  10/31/2013 03:16 PM  Referred by:  CSW  Date Referred:  10/30/2013 Referred for  SNF Placement   Other Referral:   Interview type:  Patient Other interview type:   Weekend CSW also spoke to patient's wife Gigi Gin via telephone [phone]    PSYCHOSOCIAL DATA Living Status:  FAMILY Admitted from facility:   Level of care:   Primary support name:  Gigi Gin [phone] Primary support relationship to patient:  SPOUSE Degree of support available:   Fair    CURRENT CONCERNS Current Concerns  Post-Acute Placement   Other Concerns:    SOCIAL WORK ASSESSMENT / PLAN Weekend CSW received referral from CSW colleague that patient may need SNF after upcoming surgery. Weekend CSW met with patient and explained CSW role. Patient was agreeable to speaking. CSW explained that PT may recommend SNF after surgery. Patient states that he has done physical therapy at "the hospital in [place]" before. Patient appears to be slightly confused by the conversation and was not able to clearly communicate at this time what kind of care he wanted at discharge. Patient stated that he wanted to go home, but also that it might be alright to go back to the "hospital" again to do more PT. Patient states that he is not able to ambulate currently but that he has a wheel chair at home that he could use. Patient was calm and cooperative during coversation. Weekend CSW got patient's permission to contact his wife Gigi Gin to discuss PT options at discharge.    Weekend CSW spoke with patient's wife Peggy via telephone and spoke to her about the option of SNF for PT rehab. Wife stated that she would talk to patient about it, but gave CSW permission to contact SNF facilities in [place]. Wife  states that [facility] is preferable.    Weekday CSW to follow for discharge needs if patient decides to go to SNF.   Assessment/plan status:  Information/Referral to [facility] Other assessment/ plan:   Information/referral to community resources:   CSW to fax out patient information to [facility]. SNF's for bed offers.    PATIENT'S/FAMILY'S RESPONSE TO PLAN OF CARE: From CSW assessment, it appears as though the patient would like to go home at discharge, wife provided permission to contact SNF facilities in [place] with a preference for [facility].    Samuella Bruin, MSW, LCSWA Clinical Social Worker [facility] Emergency Dept. [phone]

## 2013-10-31 NOTE — Progress Notes (Signed)
Agree with A&P of ER,NP. Patient getting ready to get OOB with PT now

## 2013-10-31 NOTE — Progress Notes (Addendum)
Clinical Social Work Department CLINICAL SOCIAL WORK PLACEMENT NOTE 10/31/2013  Patient:  Mark Montoya, Mark Montoya  Account Number:  [id number] Admit date:  10/29/2013  Clinical Social Worker:  Samuella Bruin, Theresia Majors  Date/time:  10/31/2013 04:02 PM  Clinical Social Work is seeking post-discharge placement for this patient at the following level of care:   SKILLED NURSING   (*CSW will update this form in Epic as items are completed)     Patient/family provided with [facility] Department of Clinical Social Work's list of facilities offering this level of care within the geographic area requested by the patient (or if unable, by the patient's family).  10/31/2013  Patient/family informed of their freedom to choose among providers that offer the needed level of care, that participate in Medicare, Medicaid or managed care program needed by the patient, have an available bed and are willing to accept the patient.    Patient/family informed of [facility]' ownership interest in [facility], as well as of the fact that they are under no obligation to receive care at this facility.  PASARR submitted to EDS on 10/31/2013 PASARR number received from EDS on 10/31/2013- [id number]  FL2 transmitted to all facilities in geographic area requested by pt/family on  10/31/2013 FL2 transmitted to all facilities within larger geographic area on   Patient informed that his/her managed care company has contracts with or will negotiate with  certain facilities, including the following:     Patient/family informed of bed offers received:  11/03/2013 Patient chooses bed at  [facility] Physician recommends and patient chooses bed at    Patient to be transferred to [facility] [place] on  11/04/2013   Patient to be transferred to facility by  [facility]  The following physician request were entered in Epic:   Additional Comments:  Samuella Bruin, MSW, LCSWA Clinical Social Worker [facility]  Emergency Dept. [phone]

## 2013-11-01 DIAGNOSIS — I2510 Atherosclerotic heart disease of native coronary artery without angina pectoris: Secondary | ICD-10-CM

## 2013-11-01 DIAGNOSIS — I10 Essential (primary) hypertension: Secondary | ICD-10-CM

## 2013-11-01 LAB — BASIC METABOLIC PANEL
CO2: 26 mEq/L (ref 19–32)
Chloride: 107 mEq/L (ref 96–112)
Creatinine, Ser: 1.58 mg/dL — ABNORMAL HIGH (ref 0.50–1.35)
GFR calc Af Amer: 48 mL/min — ABNORMAL LOW (ref >90)
GFR calc non Af Amer: 41 mL/min — ABNORMAL LOW (ref >90)
Glucose, Bld: 152 mg/dL — ABNORMAL HIGH (ref 70–99)
Potassium: 3.8 mEq/L (ref 3.5–5.1)
Sodium: 141 mEq/L (ref 135–145)

## 2013-11-01 LAB — CBC
Hemoglobin: 9.2 g/dL — ABNORMAL LOW (ref 13.0–17.0)
MCH: 29.7 pg (ref 26.0–34.0)
MCHC: 33.7 g/dL (ref 30.0–36.0)
RBC: 3.1 MIL/uL — ABNORMAL LOW (ref 4.22–5.81)
WBC: 6.2 10*3/uL (ref 4.0–10.5)

## 2013-11-01 LAB — GLUCOSE, CAPILLARY
Glucose-Capillary: 168 mg/dL — ABNORMAL HIGH (ref 70–99)
Glucose-Capillary: 142 mg/dL — ABNORMAL HIGH (ref 70–99)
Glucose-Capillary: 188 mg/dL — ABNORMAL HIGH (ref 70–99)
Glucose-Capillary: 127 mg/dL — ABNORMAL HIGH (ref 70–99)

## 2013-11-01 NOTE — Progress Notes (Signed)
Agree with A&P of ER,PA. Patient is quite comfortable, stable and awaiting surgery tomorrow
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
## 2013-11-02 ENCOUNTER — Inpatient Hospital Stay (HOSPITAL_COMMUNITY): Payer: No Typology Code available for payment source | Admitting: Anesthesiology

## 2013-11-02 ENCOUNTER — Encounter (HOSPITAL_COMMUNITY): Payer: No Typology Code available for payment source | Admitting: Anesthesiology

## 2013-11-02 ENCOUNTER — Inpatient Hospital Stay (HOSPITAL_COMMUNITY): Payer: No Typology Code available for payment source

## 2013-11-02 ENCOUNTER — Encounter (HOSPITAL_COMMUNITY): Payer: Self-pay | Admitting: Cardiology

## 2013-11-02 ENCOUNTER — Encounter (HOSPITAL_COMMUNITY): Admission: EM | Disposition: A | Discharge status: Skilled Nursing Facility | Payer: Self-pay | Source: Home / Self Care

## 2013-11-02 DIAGNOSIS — D696 Thrombocytopenia, unspecified: Secondary | ICD-10-CM

## 2013-11-02 DIAGNOSIS — E119 Type 2 diabetes mellitus without complications: Secondary | ICD-10-CM

## 2013-11-02 DIAGNOSIS — Z951 Presence of aortocoronary bypass graft: Secondary | ICD-10-CM

## 2013-11-02 DIAGNOSIS — N179 Acute kidney failure, unspecified: Secondary | ICD-10-CM | POA: Diagnosis present

## 2013-11-02 HISTORY — PX: ORIF TIBIA PLATEAU: SHX2132

## 2013-11-02 LAB — GLUCOSE, CAPILLARY
Glucose-Capillary: 164 mg/dL — ABNORMAL HIGH (ref 70–99)
Glucose-Capillary: 146 mg/dL — ABNORMAL HIGH (ref 70–99)
Glucose-Capillary: 145 mg/dL — ABNORMAL HIGH (ref 70–99)
Glucose-Capillary: 167 mg/dL — ABNORMAL HIGH (ref 70–99)

## 2013-11-02 LAB — CBC
Hemoglobin: 8.9 g/dL — ABNORMAL LOW (ref 13.0–17.0)
MCH: 30 pg (ref 26.0–34.0)
MCV: 87.9 fL (ref 78.0–100.0)
Platelets: 96 10*3/uL — ABNORMAL LOW (ref 150–400)
RDW: 13.6 % (ref 11.5–15.5)
WBC: 4.9 10*3/uL (ref 4.0–10.5)

## 2013-11-02 LAB — BASIC METABOLIC PANEL
CO2: 21 mEq/L (ref 19–32)
Calcium: 8.3 mg/dL — ABNORMAL LOW (ref 8.4–10.5)
Chloride: 107 mEq/L (ref 96–112)
Creatinine, Ser: 1.5 mg/dL — ABNORMAL HIGH (ref 0.50–1.35)
GFR calc Af Amer: 51 mL/min — ABNORMAL LOW (ref >90)
GFR calc non Af Amer: 44 mL/min — ABNORMAL LOW (ref >90)
Sodium: 142 mEq/L (ref 135–145)

## 2013-11-02 LAB — PREPARE RBC (CROSSMATCH)

## 2013-11-02 LAB — ABO/RH: ABO/RH(D): O NEG

## 2013-11-02 SURGERY — OPEN REDUCTION INTERNAL FIXATION (ORIF) TIBIAL PLATEAU
Anesthesia: General | Site: Knee | Laterality: Right

## 2013-11-02 MED ORDER — LABETALOL HCL 5 MG/ML IV SOLN
10.0000 mg | Freq: Once | INTRAVENOUS | Status: DC
  Administered 2013-11-02: 10 mg via INTRAVENOUS

## 2013-11-02 MED ORDER — 0.9 % SODIUM CHLORIDE (POUR BTL) OPTIME
TOPICAL | Status: DC | PRN
  Administered 2013-11-02: 1000 mL

## 2013-11-02 MED ORDER — EPHEDRINE SULFATE 50 MG/ML IJ SOLN
INTRAMUSCULAR | Status: DC | PRN
  Administered 2013-11-02: 10 mg via INTRAVENOUS

## 2013-11-02 MED ORDER — ROCURONIUM BROMIDE 100 MG/10ML IV SOLN
INTRAVENOUS | Status: DC | PRN
  Administered 2013-11-02: 10 mg via INTRAVENOUS
  Administered 2013-11-02: 40 mg via INTRAVENOUS

## 2013-11-02 MED ORDER — METFORMIN HCL 500 MG PO TABS
1000.0000 mg | ORAL_TABLET | Freq: Every day | ORAL | Status: DC
  Administered 2013-11-03 – 2013-11-04 (×2): 1000 mg via ORAL
  Filled 2013-11-02 (×3): qty 2

## 2013-11-02 MED ORDER — HYDROMORPHONE HCL PF 1 MG/ML IJ SOLN
INTRAMUSCULAR | Status: AC
  Administered 2013-11-02: 0.5 mg via INTRAVENOUS
  Filled 2013-11-02: qty 1

## 2013-11-02 MED ORDER — LABETALOL HCL 5 MG/ML IV SOLN
INTRAVENOUS | Status: AC
  Administered 2013-11-02: 10 mg via INTRAVENOUS
  Filled 2013-11-02: qty 4

## 2013-11-02 MED ORDER — CEFAZOLIN SODIUM 1-5 GM-% IV SOLN
1.0000 g | Freq: Three times a day (TID) | INTRAVENOUS | Status: AC
  Administered 2013-11-02 – 2013-11-03 (×3): 1 g via INTRAVENOUS
  Filled 2013-11-02 (×3): qty 50

## 2013-11-02 MED ORDER — HYDROMORPHONE HCL PF 1 MG/ML IJ SOLN
0.2500 mg | INTRAMUSCULAR | Status: DC | PRN
  Administered 2013-11-02 (×4): 0.5 mg via INTRAVENOUS

## 2013-11-02 MED ORDER — FENTANYL CITRATE 0.05 MG/ML IJ SOLN
INTRAMUSCULAR | Status: DC | PRN
  Administered 2013-11-02 (×3): 50 ug via INTRAVENOUS
  Administered 2013-11-02: 100 ug via INTRAVENOUS

## 2013-11-02 MED ORDER — CEFAZOLIN SODIUM-DEXTROSE 2-3 GM-% IV SOLR
2.0000 g | INTRAVENOUS | Status: AC
  Administered 2013-11-02: 2 g via INTRAVENOUS
  Filled 2013-11-02: qty 50

## 2013-11-02 MED ORDER — NEOSTIGMINE METHYLSULFATE 1 MG/ML IJ SOLN
INTRAMUSCULAR | Status: DC | PRN
  Administered 2013-11-02: 4 mg via INTRAVENOUS

## 2013-11-02 MED ORDER — ONDANSETRON HCL 4 MG/2ML IJ SOLN
INTRAMUSCULAR | Status: DC | PRN
  Administered 2013-11-02: 4 mg via INTRAVENOUS

## 2013-11-02 MED ORDER — METFORMIN HCL 500 MG PO TABS: 500.0000 mg | ORAL_TABLET | Freq: Two times a day (BID) | ORAL | Status: DC

## 2013-11-02 MED ORDER — DEXTROSE 5 % IV SOLN
INTRAVENOUS | Status: DC | PRN
  Administered 2013-11-02: 13:00:00 via INTRAVENOUS

## 2013-11-02 MED ORDER — ONDANSETRON HCL 4 MG/2ML IJ SOLN: 4.0000 mg | Freq: Once | INTRAMUSCULAR | Status: DC | PRN

## 2013-11-02 MED ORDER — PROPOFOL 10 MG/ML IV BOLUS
INTRAVENOUS | Status: DC | PRN
  Administered 2013-11-02: 110 mg via INTRAVENOUS
  Administered 2013-11-02: 40 mg via INTRAVENOUS

## 2013-11-02 MED ORDER — METFORMIN HCL 500 MG PO TABS
500.0000 mg | ORAL_TABLET | Freq: Every day | ORAL | Status: DC
  Administered 2013-11-03 – 2013-11-04 (×2): 500 mg via ORAL
  Filled 2013-11-02 (×2): qty 1

## 2013-11-02 MED ORDER — SUCCINYLCHOLINE CHLORIDE 20 MG/ML IJ SOLN
INTRAMUSCULAR | Status: DC | PRN
  Administered 2013-11-02: 120 mg via INTRAVENOUS

## 2013-11-02 MED ORDER — LIDOCAINE HCL (CARDIAC) 20 MG/ML IV SOLN
INTRAVENOUS | Status: DC | PRN
  Administered 2013-11-02: 80 mg via INTRAVENOUS

## 2013-11-02 MED ORDER — LACTATED RINGERS IV SOLN
INTRAVENOUS | Status: DC | PRN
  Administered 2013-11-02 (×2): via INTRAVENOUS

## 2013-11-02 MED ORDER — GLYCOPYRROLATE 0.2 MG/ML IJ SOLN
INTRAMUSCULAR | Status: DC | PRN
  Administered 2013-11-02: 0.6 mg via INTRAVENOUS

## 2013-11-02 SURGICAL SUPPLY — 76 items
BANDAGE ELASTIC 4 VELCRO ST LF (GAUZE/BANDAGES/DRESSINGS) ×2 IMPLANT
BANDAGE ELASTIC 6 VELCRO ST LF (GAUZE/BANDAGES/DRESSINGS) ×2 IMPLANT
BANDAGE ESMARK 6X9 LF (GAUZE/BANDAGES/DRESSINGS) ×1 IMPLANT
BANDAGE GAUZE ELAST BULKY 4 IN (GAUZE/BANDAGES/DRESSINGS) ×2 IMPLANT
BLADE SURG 10 STRL SS (BLADE) ×2 IMPLANT
BLADE SURG 15 STRL LF DISP TIS (BLADE) IMPLANT
BLADE SURG 15 STRL SS (BLADE)
BLADE SURG ROTATE 9660 (MISCELLANEOUS) IMPLANT
BNDG COHESIVE 4X5 TAN STRL (GAUZE/BANDAGES/DRESSINGS) ×2 IMPLANT
BNDG ESMARK 6X9 LF (GAUZE/BANDAGES/DRESSINGS) ×2
BRUSH SCRUB DISP (MISCELLANEOUS) ×4 IMPLANT
CLOTH BEACON ORANGE TIMEOUT ST (SAFETY) ×2 IMPLANT
COVER MAYO STAND STRL (DRAPES) IMPLANT
DRAPE C-ARM 42X72 X-RAY (DRAPES) ×2 IMPLANT
DRAPE C-ARMOR (DRAPES) ×2 IMPLANT
DRAPE INCISE IOBAN 66X45 STRL (DRAPES) IMPLANT
DRAPE ORTHO SPLIT 77X108 STRL (DRAPES)
DRAPE SURG ORHT 6 SPLT 77X108 (DRAPES) IMPLANT
DRAPE U-SHAPE 47X51 STRL (DRAPES) ×2 IMPLANT
DRILL BIT 2.7X200MM CAL (BIT) ×2 IMPLANT
DRSG ADAPTIC 3X8 NADH LF (GAUZE/BANDAGES/DRESSINGS) ×2 IMPLANT
DRSG PAD ABDOMINAL 8X10 ST (GAUZE/BANDAGES/DRESSINGS) ×8 IMPLANT
ELECT REM PT RETURN 9FT ADLT (ELECTROSURGICAL) ×2
ELECTRODE REM PT RTRN 9FT ADLT (ELECTROSURGICAL) ×1 IMPLANT
EVACUATOR 1/8 PVC DRAIN (DRAIN) IMPLANT
EVACUATOR 3/16  PVC DRAIN (DRAIN)
EVACUATOR 3/16 PVC DRAIN (DRAIN) IMPLANT
GLOVE BIO SURGEON STRL SZ7.5 (GLOVE) ×2 IMPLANT
GLOVE BIO SURGEON STRL SZ8 (GLOVE) ×2 IMPLANT
GLOVE BIOGEL PI IND STRL 7.5 (GLOVE) ×1 IMPLANT
GLOVE BIOGEL PI IND STRL 8 (GLOVE) ×1 IMPLANT
GLOVE BIOGEL PI INDICATOR 7.5 (GLOVE) ×1
GLOVE BIOGEL PI INDICATOR 8 (GLOVE) ×1
GOWN PREVENTION PLUS XLARGE (GOWN DISPOSABLE) ×2 IMPLANT
GOWN STRL NON-REIN LRG LVL3 (GOWN DISPOSABLE) ×4 IMPLANT
IMMOBILIZER KNEE 22 UNIV (SOFTGOODS) IMPLANT
KIT BASIN OR (CUSTOM PROCEDURE TRAY) ×2 IMPLANT
KIT ROOM TURNOVER OR (KITS) ×2 IMPLANT
KWIRE 1.6X150MM (WIRE) ×6 IMPLANT
MANIFOLD NEPTUNE II (INSTRUMENTS) ×2 IMPLANT
NDL SUT 6 .5 CRC .975X.05 MAYO (NEEDLE) IMPLANT
NEEDLE 22X1 1/2 (OR ONLY) (NEEDLE) IMPLANT
NEEDLE MAYO TAPER (NEEDLE)
NS IRRIG 1000ML POUR BTL (IV SOLUTION) ×2 IMPLANT
PACK ORTHO EXTREMITY (CUSTOM PROCEDURE TRAY) ×2 IMPLANT
PAD ARMBOARD 7.5X6 YLW CONV (MISCELLANEOUS) ×4 IMPLANT
PAD CAST 4YDX4 CTTN HI CHSV (CAST SUPPLIES) ×1 IMPLANT
PADDING CAST COTTON 4X4 STRL (CAST SUPPLIES) ×1
PADDING CAST COTTON 6X4 STRL (CAST SUPPLIES) ×2 IMPLANT
PLATE LAT PROX TIB 8H RT (Plate) ×2 IMPLANT
SCREW LOCK 3.5X36MM TI LNG (Screw) ×2 IMPLANT
SCREW LOCK 3.5X38MM TI LNG (Screw) ×4 IMPLANT
SCREW LOCK TI 3.5X 80 LG (Screw) ×2 IMPLANT
SCREW LOCK TI 3.5X 85 LG (Screw) ×8 IMPLANT
SCREW NONLOCK 3.5X34MM TI LNG (Screw) ×2 IMPLANT
SCREW NONLOCK 3.5X36MM TI LNG (Screw) ×2 IMPLANT
SPONGE GAUZE 4X4 12PLY (GAUZE/BANDAGES/DRESSINGS) ×2 IMPLANT
SPONGE LAP 18X18 X RAY DECT (DISPOSABLE) ×4 IMPLANT
STAPLER VISISTAT 35W (STAPLE) ×2 IMPLANT
STOCKINETTE IMPERVIOUS LG (DRAPES) ×2 IMPLANT
SUCTION FRAZIER TIP 10 FR DISP (SUCTIONS) ×2 IMPLANT
SUT ETHILON 3 0 PS 1 (SUTURE) ×6 IMPLANT
SUT PROLENE 0 CT 2 (SUTURE) ×2 IMPLANT
SUT VIC AB 0 CT1 27 (SUTURE) ×1
SUT VIC AB 0 CT1 27XBRD ANBCTR (SUTURE) ×1 IMPLANT
SUT VIC AB 1 CT1 27 (SUTURE) ×1
SUT VIC AB 1 CT1 27XBRD ANBCTR (SUTURE) ×1 IMPLANT
SUT VIC AB 2-0 CT1 27 (SUTURE) ×2
SUT VIC AB 2-0 CT1 TAPERPNT 27 (SUTURE) ×2 IMPLANT
SYR 20ML ECCENTRIC (SYRINGE) IMPLANT
TOWEL OR 17X24 6PK STRL BLUE (TOWEL DISPOSABLE) ×2 IMPLANT
TOWEL OR 17X26 10 PK STRL BLUE (TOWEL DISPOSABLE) ×4 IMPLANT
TRAY FOLEY CATH 16FRSI W/METER (SET/KITS/TRAYS/PACK) IMPLANT
TUBE CONNECTING 12X1/4 (SUCTIONS) ×2 IMPLANT
WATER STERILE IRR 1000ML POUR (IV SOLUTION) ×4 IMPLANT
YANKAUER SUCT BULB TIP NO VENT (SUCTIONS) ×2 IMPLANT

## 2013-11-02 NOTE — Progress Notes (Signed)
The beginning of shift patient is having extreme lower abdominal pain.  On assessment lower abdomen is tight and distended.  Applied warm compression to see if patient would have larger urine out put with condom catheter.  At 2000, we did a bladder scan, it showed 975 ml of urine.  I did an I/O cath at 2015.  Removed 950 ml of urine.  Patient's pain immediately resolved.  Lower abdomen less tight and now non-tender.  Will continue to monitor.

## 2013-11-02 NOTE — Anesthesia Preprocedure Evaluation (Addendum)
Anesthesia Evaluation  Patient identified by MRN, date of birth, ID band Patient awake    Reviewed: Allergy & Precautions, H&P , NPO status , Patient's Chart, lab work & pertinent test results, reviewed documented beta blocker date and time   Airway Mallampati: II TM Distance: >3 FB Neck ROM: Full    Dental  (+) Missing, Poor Dentition and Dental Advisory Given   Pulmonary          Cardiovascular hypertension, Pt. on medications + CAD and + CABG     Neuro/Psych CVA, Residual Symptoms    GI/Hepatic   Endo/Other  diabetes, Type 2, Oral Hypoglycemic Agents  Renal/GU Renal disease     Musculoskeletal   Abdominal   Peds  Hematology   Anesthesia Other Findings All teeth deeply stained and broken.  Reproductive/Obstetrics                         Anesthesia Physical Anesthesia Plan  ASA: III  Anesthesia Plan: General   Post-op Pain Management:    Induction: Intravenous  Airway Management Planned: Oral ETT  Additional Equipment:   Intra-op Plan:   Post-operative Plan: Extubation in OR  Informed Consent: I have reviewed the patients History and Physical, chart, labs and discussed the procedure including the risks, benefits and alternatives for the proposed anesthesia with the patient or authorized representative who has indicated his/her understanding and acceptance.     Plan Discussed with:   Anesthesia Plan Comments:         Anesthesia Quick Evaluation

## 2013-11-02 NOTE — Progress Notes (Signed)
I have seen and examined the patient. I agree with the findings above.  We continue to await arrival of wife from [place] to sign consent because of concerns re patient lucidity this am, but currently patient is entirely oriented x 4.  I have discussed with the patient and his wife the risks and benefits of surgery, including the possibility of infection, nerve injury, vessel injury, wound breakdown, arthritis, symptomatic hardware, DVT/ PE, loss of motion, and need for further surgery among others.  We also specifically discussed the elevated risk of soft tissue breakdown that could lead to amputation.  They understood these risks and wished to proceed.  Budd Palmer, MD 11/02/2013 12:20pm

## 2013-11-02 NOTE — Transfer of Care (Signed)
Immediate Anesthesia Transfer of Care Note  Patient: Mark Montoya  Procedure(s) Performed: Procedure(s): OPEN REDUCTION INTERNAL FIXATION (ORIF) RIGHT TIBIAL PLATEAU (Right)  Patient Location: PACU  Anesthesia Type:General  Level of Consciousness: awake and patient cooperative  Airway & Oxygen Therapy: Patient Spontanous Breathing and Patient connected to face mask oxygen  Post-op Assessment: Report given to PACU RN, Post -op Vital signs reviewed and stable and Patient moving all extremities  Post vital signs: Reviewed and stable  Complications: No apparent anesthesia complications

## 2013-11-02 NOTE — Anesthesia Procedure Notes (Signed)
Procedure Name: Intubation Date/Time: 11/02/2013 1:21 PM Performed by: Darcey Nora B Pre-anesthesia Checklist: Emergency Drugs available, Patient identified, Suction available and Patient being monitored Patient Re-evaluated:Patient Re-evaluated prior to inductionOxygen Delivery Method: Circle system utilized Preoxygenation: Pre-oxygenation with 100% oxygen Intubation Type: IV induction Ventilation: Mask ventilation without difficulty Laryngoscope Size: Mac and 4 Grade View: Grade II Tube type: Oral Tube size: 7.5 mm Number of attempts: 1 Airway Equipment and Method: Stylet Placement Confirmation: ETT inserted through vocal cords under direct vision,  breath sounds checked- equal and bilateral and positive ETCO2 Secured at: 21 (cm at teeth/gum) cm Tube secured with: Tape Dental Injury: Teeth and Oropharynx as per pre-operative assessment

## 2013-11-02 NOTE — Progress Notes (Signed)
11/01/2013: Upon arrival to floor patient began complaining of severe bladder pain radiating down into his scrotal area.   RN made multiple attempts to address this pain with no relief.
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
## 2013-11-02 NOTE — Progress Notes (Signed)
RN called report to [place] in [place]. All questions answered.   Informed RN Marcelino Duster about patients wishes for wife to go to pre procedure area with him before surgery. Also informed Marcelino Duster that the consent forms had not been signed due to patients mild dementia, and that the patients wife Gigi Gin would sign once she arrived.
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
## 2013-11-02 NOTE — Preoperative (Addendum)
Beta Blockers   Reason not to administer Beta Blockers:  Metoprolol at 0945 this morning

## 2013-11-02 NOTE — Progress Notes (Signed)
PT Cancellation Note  Patient Details Name: Mark Montoya MRN: [id number] DOB: 10/24/1941   Cancelled Treatment:    Reason Eval/Treat Not Completed: Other (comment) (Gone to OR) Will follow-up for PT tomorrow   Van Clines [facility] 11/02/2013, 12:10 PM
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
## 2013-11-02 NOTE — Anesthesia Postprocedure Evaluation (Signed)
  Anesthesia Post-op Note  Patient: Mark Montoya  Procedure(s) Performed: Procedure(s): OPEN REDUCTION INTERNAL FIXATION (ORIF) RIGHT TIBIAL PLATEAU (Right)  Patient Location: PACU  Anesthesia Type:General  Level of Consciousness: awake and sedated  Airway and Oxygen Therapy: Patient Spontanous Breathing  Post-op Pain: mild  Post-op Assessment: Post-op Vital signs reviewed  Post-op Vital Signs: stable  Complications: No apparent anesthesia complications

## 2013-11-02 NOTE — Clinical Social Work Note (Signed)
Clinical Social Worker continuing to follow patient and family for support and discharge planning needs.  Patient remains in surgery, however patient family present in room.  Patient wife states that patient would really prefer to go home with home health at discharge.  CSW explained that PT would likely work with him again following surgery and recommendations would be provided.  Patient wife in agreement with patient return home if patient continues to refuse placement.  Patient wife states that they would prefer [facility] for home health - CSW to update CM.  CSW to follow up with patient and family tomorrow to confirm plans at discharge.  Macario Golds, [place] [phone]

## 2013-11-02 NOTE — Consult Note (Signed)
I have seen and examined the patient. I agree with the findings above. Please see my other previous note for my examination and plan.  Budd Palmer, MD 11/02/2013 5:02 PM
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
## 2013-11-02 NOTE — Progress Notes (Signed)
For surgery today.  This patient has been seen and I agree with the findings and treatment plan.  Marta Lamas. Gae Bon, MD, FACS [phone] (pager) [phone] (direct pager) Trauma Surgeon

## 2013-11-02 NOTE — Progress Notes (Signed)
I have seen and evaluated the patient this AM along with Boyce Medici, PA. I agree with her findings, examination as well as impression recommendations.  Was on his way to OR when I came in to see him.  Only has pain if he moves his R leg.   Only concerning feature is HTN - given hydralazine this AM, but will need to monitor BP closely post-op of his significant HTN.  Not sure of the benefit of BB that was just initiated, but as long as HR will tolerate, the BP effect will be beneficial (? Convert to Carvedilol post-op for more BP > HR control.  OK for OR with no further w/u - just had Myoview.  Will check in on him post-op.  Marykay Lex, M.D., M.S. [facility] [address] [place], [place]  [address]  [phone] Pager # [phone] 11/02/2013 11:39 AM

## 2013-11-03 DIAGNOSIS — R339 Retention of urine, unspecified: Secondary | ICD-10-CM | POA: Diagnosis not present

## 2013-11-03 LAB — CBC
HCT: 27.9 % — ABNORMAL LOW (ref 39.0–52.0)
Hemoglobin: 9.4 g/dL — ABNORMAL LOW (ref 13.0–17.0)
MCHC: 33.7 g/dL (ref 30.0–36.0)
RBC: 3.16 MIL/uL — ABNORMAL LOW (ref 4.22–5.81)
WBC: 7.4 10*3/uL (ref 4.0–10.5)

## 2013-11-03 LAB — BASIC METABOLIC PANEL
BUN: 18 mg/dL (ref 6–23)
CO2: 26 mEq/L (ref 19–32)
Chloride: 102 mEq/L (ref 96–112)
Creatinine, Ser: 1.5 mg/dL — ABNORMAL HIGH (ref 0.50–1.35)
GFR calc non Af Amer: 44 mL/min — ABNORMAL LOW (ref >90)
Glucose, Bld: 165 mg/dL — ABNORMAL HIGH (ref 70–99)
Potassium: 3.5 mEq/L (ref 3.5–5.1)

## 2013-11-03 LAB — URINALYSIS, ROUTINE W REFLEX MICROSCOPIC
Bilirubin Urine: NEGATIVE
Glucose, UA: 100 mg/dL — AB
Ketones, ur: 15 mg/dL — AB
Nitrite: NEGATIVE
Protein, ur: NEGATIVE mg/dL
pH: 7 (ref 5.0–8.0)

## 2013-11-03 LAB — GLUCOSE, CAPILLARY
Glucose-Capillary: 257 mg/dL — ABNORMAL HIGH (ref 70–99)
Glucose-Capillary: 162 mg/dL — ABNORMAL HIGH (ref 70–99)

## 2013-11-03 LAB — URINE MICROSCOPIC-ADD ON

## 2013-11-03 MED ORDER — SORBITOL 70 % SOLN
960.0000 mL | TOPICAL_OIL | Freq: Once | ORAL | Status: AC
  Administered 2013-11-03: 960 mL via RECTAL
  Filled 2013-11-03: qty 240

## 2013-11-03 MED ORDER — BETHANECHOL CHLORIDE 10 MG PO TABS
10.0000 mg | ORAL_TABLET | ORAL | Status: AC
  Administered 2013-11-03 (×5): 10 mg via ORAL
  Filled 2013-11-03 (×5): qty 1

## 2013-11-03 MED ORDER — TAMSULOSIN HCL 0.4 MG PO CAPS
0.8000 mg | ORAL_CAPSULE | Freq: Every day | ORAL | Status: DC
  Administered 2013-11-03 – 2013-11-04 (×2): 0.8 mg via ORAL
  Filled 2013-11-03 (×3): qty 2
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
## 2013-11-03 NOTE — Progress Notes (Signed)
UR completed.  Rikita Grabert, RN BSN [facility] CCM Trauma/Neuro ICU Case Manager [phone]  

## 2013-11-03 NOTE — Progress Notes (Signed)
Patient has been unable to void since yesterday after surgery.  Night shift RN in and out cathed patient 2 times.  PA for the attending physician this morning informed of situation last night.   SMOG enema given at 1130.   Bladder scan at 1216 showed in patients bladder. RN called PA, and PA requested that we give the patient a few more hours to see if the enema would help relieve some pressure on his bladder before catheterizing the patient.   Bladder scan at 1359 showed greater than 476 in the patients bladder. PA called at 1445 after trying to help the patient void in the urinal, encouraging fluids, as well as medications.  PA gave the telephone order to place a foley catheter.
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
## 2013-11-03 NOTE — Progress Notes (Signed)
Patient started having lower abdominal pain/discomfort again.  Fluids were encouraged, warm compression used on lower abdomen, running water.  Patient has been unable to void more than 50ml in 8 hours.  Bladder scan was done at 0400, 669 ml in bladder.  At 0415, patient was I/O cath for the second time 800 ml of urine was removed.  Patient is feeling comfortable again.  Will continue to monitor.
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
## 2013-11-03 NOTE — Clinical Social Work Note (Signed)
Clinical Social Worker continuing to follow patient and family for support and discharge planning needs.  CSW spoke with patient and patient grandson at bedside to further discuss discharge plans.  Patient states that he would prefer to go home but would consider placement at the [facility]  -CSW relayed that the [facility] does not have bed availability at this time.  CSW provided additional offers - patient and grandson plan to further discuss with patient wife and have a decision in the morning.  CSW to follow up and facilitate patient discharge needs once medically stable.  Macario Golds, [place] [phone]

## 2013-11-04 DIAGNOSIS — K089 Disorder of teeth and supporting structures, unspecified: Secondary | ICD-10-CM

## 2013-11-04 LAB — GLUCOSE, CAPILLARY
Glucose-Capillary: 166 mg/dL — ABNORMAL HIGH (ref 70–99)
Glucose-Capillary: 195 mg/dL — ABNORMAL HIGH (ref 70–99)

## 2013-11-04 MED ORDER — TRAMADOL HCL 50 MG PO TABS: 50.0000 mg | ORAL_TABLET | Freq: Four times a day (QID) | ORAL | Status: DC | PRN

## 2013-11-04 MED ORDER — TAMSULOSIN HCL 0.4 MG PO CAPS: 0.8000 mg | ORAL_CAPSULE | Freq: Every day | ORAL | Status: DC

## 2013-11-04 MED ORDER — ACETAMINOPHEN 325 MG PO TABS: 325.0000 mg | ORAL_TABLET | Freq: Four times a day (QID) | ORAL | Status: DC | PRN
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
## 2013-11-04 NOTE — Progress Notes (Signed)
Patient comfortable.  Has Foley.  SNF placement now.  This patient has been seen and I agree with the findings and treatment plan.  Marta Lamas. Gae Bon, MD, FACS [phone] (pager) [phone] (direct pager) Trauma Surgeon

## 2013-11-04 NOTE — Progress Notes (Deleted)
RN reviewed discharge instructions with patient.   Reinforced importance of Diabetes Management, and smoking cessation. RN asked patient if he wanted to quit smoking, and the patient said no.   Discharge instructions and prescription given to patient. Patient informed that novolog and lantus were called into the [facility].   Patient wheeled down with RN, Stephen. 

## 2013-11-04 NOTE — Progress Notes (Signed)
RN attempted to call report to [facility] in [place], [place] 3 times.  The number that was called was [phone]. The first time, and recertionist answered and forwared me and I was placed on hold.   The second and third time no one answered at the receptionist desk, and therefore the call was disconnected.   No report was given.  Will inform charge nurse, and oncoming RN that took care of Mark Montoya last night.

## 2013-11-04 NOTE — Clinical Social Work Note (Signed)
Clinical Social Worker continuing to follow patient and family for support and discharge planning needs.  CSW met with patient at bedside who could not state a clear decision and deferred decision making to his wife.  CSW spoke with patient wife over the phone who was agreeable to placement at [facility] at [place].  CSW spoke to facility who is agreeable with admission today pending time of discharge summary - PA aware.  CSW to facilitate patient discharge needs once medically ready.  Macario Golds, [place] [phone]

## 2013-11-04 NOTE — Progress Notes (Signed)
Pt. Seen and examined. Agree with the NP/PA-C note as written.  Stable post-op.  Blood pressure remains high, however, anti-hypertensive medications have been resumed today. Will follow with you and adjust bp meds accordingly.  Note plan for d/c to SNF.  Chrystie Nose, MD, [facility] Attending Cardiologist [facility]
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
## 2013-11-04 NOTE — Clinical Social Work Note (Signed)
Clinical Social Worker facilitated patient discharge including contacting patient family and facility to confirm patient discharge plans.  Clinical information faxed to facility and family agreeable with plan.  CSW arranged ambulance transport via [facility] to [facility] [place].  RN to call report prior to discharge.  Clinical Social Worker will sign off for now as social work intervention is no longer needed. Please consult us again if new need arises.  Jesse Viktoriya Glaspy, LCSW [phone] 
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
## 2013-11-04 NOTE — Progress Notes (Signed)
Physical Therapy Treatment Patient Details Name: Mark Montoya MRN: [id number] DOB: 1941-03-19 Today's Date: 11/04/2013 Time: 4098-1191 PT Time Calculation (min): 32 min  PT Assessment / Plan / Recommendation  History of Present Illness Pt is a 72 y/o male admitted s/p R ORIF tibial plateau   PT Comments   Pt making very slow progress towards physical therapy goals. Pt was able to initiate transition to EOB with decreased assist compared to previous session, however pt fatigued quickly and was not able to come to full stand. A third person was required to manage equipment during transfer, and a 4th person was required to stand pt all the way up to assess bowel incontinence and hygiene needs.   Follow Up Recommendations  SNF;Supervision for mobility/OOB     Does the patient have the potential to tolerate intense rehabilitation     Barriers to Discharge        Equipment Recommendations  None recommended by PT (Equipment needs TBD at next venue of care)    Recommendations for Other Services    Frequency Min 5X/week   Progress towards PT Goals Progress towards PT goals: Progressing toward goals  Plan Current plan remains appropriate    Precautions / Restrictions Precautions Precautions: Fall Required Braces or Orthoses: Other Brace/Splint Knee Immobilizer - Right: On at all times Other Brace/Splint: Hinged brace on R Restrictions Weight Bearing Restrictions: Yes RLE Weight Bearing: Non weight bearing   Pertinent Vitals/Pain Pt does not report any pain throughout session when asked, however says "ouch" during transfers and grabs RLE.    Mobility  Bed Mobility Bed Mobility: Supine to Sit;Sitting - Scoot to Edge of Bed Supine to Sit: 1: +2 Total assist;HOB elevated;With rails Supine to Sit: Patient Percentage: 50% Sitting - Scoot to Edge of Bed: 1: +2 Total assist;With rail Sitting - Scoot to Edge of Bed: Patient Percentage: 50% Details for Bed Mobility Assistance: VC's for  sequencing and technique.  Transfers Transfers: Sit to Stand;Stand to Sit Sit to Stand: 1: +2 Total assist;From elevated surface;With upper extremity assist;From bed Sit to Stand: Patient Percentage: 30% Stand to Sit: 1: +2 Total assist;To chair/3-in-1;Without upper extremity assist Stand to Sit: Patient Percentage: 20% Details for Transfer Assistance: Pt unable to maintain NWB status this session, and had difficulty shifting weight to the LLE when cued for NWB status on the RLE. 3rd person was again required to manage the bed and chair, and pt was unable to complete a pivot transfer safely while maintaining NWB status on R.   Ambulation/Gait Ambulation/Gait Assistance: Not tested (comment) Stairs: No Wheelchair Mobility Wheelchair Mobility: No    Exercises     PT Diagnosis:    PT Problem List:   PT Treatment Interventions:     PT Goals (current goals can now be found in the care plan section) Acute Rehab PT Goals Patient Stated Goal: SNF then home PT Goal Formulation: With patient/family Time For Goal Achievement: 11/06/13 Potential to Achieve Goals: Good  Visit Information  Last PT Received On: 11/04/13 Assistance Needed: +2 History of Present Illness: Pt is a 72 y/o male admitted s/p R ORIF tibial plateau    Subjective Data  Subjective: "I believe I am doing better today." Patient Stated Goal: SNF then home   Cognition  Cognition Arousal/Alertness: Awake/alert Behavior During Therapy: WFL for tasks assessed/performed Overall Cognitive Status: History of cognitive impairments - at baseline Memory: Decreased short-term memory    Balance  Balance Balance Assessed: Yes Static Sitting Balance Static Sitting -  Balance Support: No upper extremity supported;Feet supported Static Sitting - Level of Assistance: 4: Min Oncologist Standing - Balance Support: Bilateral upper extremity supported Static Standing - Level of Assistance: 1: +2 Total  assist (30%)  End of Session PT - End of Session Equipment Utilized During Treatment: Gait belt;Other (comment) (Hinged brace) Activity Tolerance: Patient tolerated treatment well Patient left: in chair;with nursing/sitter in room;with call bell/phone within reach Nurse Communication: Mobility status   GP     Ruthann Cancer 11/04/2013, 2:05 PM  Ruthann Cancer, PT, DPT [phone]

## 2013-11-05 LAB — TYPE AND SCREEN
ABO/RH(D): O NEG
Unit division: 0

## 2013-11-16 ENCOUNTER — Encounter (HOSPITAL_COMMUNITY): Payer: Self-pay | Admitting: Orthopedic Surgery

## 2013-12-21 ENCOUNTER — Encounter (HOSPITAL_COMMUNITY): Payer: Self-pay | Admitting: Orthopedic Surgery

## 2014-05-06 ENCOUNTER — Ambulatory Visit: Payer: Medicare Other | Admitting: Cardiovascular Disease

## 2014-05-07 ENCOUNTER — Ambulatory Visit: Payer: Medicare Other | Admitting: Cardiovascular Disease

## 2014-06-15 ENCOUNTER — Ambulatory Visit: Payer: Medicare Other | Admitting: Cardiovascular Disease

## 2015-02-11 ENCOUNTER — Other Ambulatory Visit (HOSPITAL_COMMUNITY): Payer: Self-pay | Admitting: Family Medicine

## 2015-02-11 DIAGNOSIS — R531 Weakness: Secondary | ICD-10-CM

## 2015-02-14 ENCOUNTER — Ambulatory Visit (HOSPITAL_COMMUNITY)
Admission: RE | Admit: 2015-02-14 | Discharge: 2015-02-14 | Disposition: A | Discharge status: Home / Self Care | Payer: Medicare Other | Source: Ambulatory Visit | Attending: Family Medicine | Admitting: Family Medicine

## 2015-02-14 DIAGNOSIS — R531 Weakness: Secondary | ICD-10-CM

## 2015-02-14 DIAGNOSIS — I10 Essential (primary) hypertension: Secondary | ICD-10-CM | POA: Insufficient documentation

## 2015-02-14 DIAGNOSIS — E785 Hyperlipidemia, unspecified: Secondary | ICD-10-CM | POA: Insufficient documentation

## 2015-02-14 DIAGNOSIS — E119 Type 2 diabetes mellitus without complications: Secondary | ICD-10-CM | POA: Insufficient documentation

## 2015-02-14 DIAGNOSIS — I2510 Atherosclerotic heart disease of native coronary artery without angina pectoris: Secondary | ICD-10-CM | POA: Diagnosis not present

## 2015-02-23 ENCOUNTER — Other Ambulatory Visit (HOSPITAL_COMMUNITY): Payer: Self-pay | Admitting: Family Medicine

## 2015-02-23 DIAGNOSIS — E1165 Type 2 diabetes mellitus with hyperglycemia: Secondary | ICD-10-CM

## 2015-02-25 ENCOUNTER — Ambulatory Visit (HOSPITAL_COMMUNITY)
Admission: RE | Admit: 2015-02-25 | Discharge: 2015-02-25 | Disposition: A | Discharge status: Home / Self Care | Payer: Medicare Other | Source: Ambulatory Visit | Attending: Family Medicine | Admitting: Family Medicine

## 2015-02-25 DIAGNOSIS — Z720 Tobacco use: Secondary | ICD-10-CM | POA: Insufficient documentation

## 2015-02-25 DIAGNOSIS — I2510 Atherosclerotic heart disease of native coronary artery without angina pectoris: Secondary | ICD-10-CM | POA: Diagnosis not present

## 2015-02-25 DIAGNOSIS — I10 Essential (primary) hypertension: Secondary | ICD-10-CM | POA: Insufficient documentation

## 2015-02-25 DIAGNOSIS — E785 Hyperlipidemia, unspecified: Secondary | ICD-10-CM | POA: Insufficient documentation

## 2015-02-25 DIAGNOSIS — E1165 Type 2 diabetes mellitus with hyperglycemia: Secondary | ICD-10-CM

## 2015-02-25 DIAGNOSIS — E119 Type 2 diabetes mellitus without complications: Secondary | ICD-10-CM | POA: Insufficient documentation

## 2015-02-28 ENCOUNTER — Other Ambulatory Visit (HOSPITAL_COMMUNITY): Payer: Medicare Other

## 2015-03-04 ENCOUNTER — Other Ambulatory Visit (HOSPITAL_COMMUNITY): Payer: Self-pay | Admitting: Family Medicine

## 2015-03-04 DIAGNOSIS — R5383 Other fatigue: Secondary | ICD-10-CM

## 2015-03-04 DIAGNOSIS — R634 Abnormal weight loss: Secondary | ICD-10-CM

## 2015-03-09 ENCOUNTER — Ambulatory Visit (HOSPITAL_COMMUNITY)
Admission: RE | Admit: 2015-03-09 | Discharge: 2015-03-09 | Disposition: A | Discharge status: Home / Self Care | Payer: Medicare Other | Source: Ambulatory Visit | Attending: Family Medicine | Admitting: Family Medicine

## 2015-03-09 DIAGNOSIS — R5383 Other fatigue: Secondary | ICD-10-CM

## 2015-03-09 DIAGNOSIS — R634 Abnormal weight loss: Secondary | ICD-10-CM | POA: Diagnosis present

## 2016-07-24 ENCOUNTER — Emergency Department (HOSPITAL_COMMUNITY)
Admission: EM | Admit: 2016-07-24 | Discharge: 2016-07-24 | Disposition: A | Discharge status: Home / Self Care | Payer: Medicare Other | Attending: Emergency Medicine | Admitting: Emergency Medicine

## 2016-07-24 ENCOUNTER — Encounter (HOSPITAL_COMMUNITY): Payer: Self-pay | Admitting: Emergency Medicine

## 2016-07-24 ENCOUNTER — Emergency Department (HOSPITAL_COMMUNITY): Payer: Medicare Other

## 2016-07-24 DIAGNOSIS — I10 Essential (primary) hypertension: Secondary | ICD-10-CM | POA: Diagnosis not present

## 2016-07-24 DIAGNOSIS — E119 Type 2 diabetes mellitus without complications: Secondary | ICD-10-CM | POA: Diagnosis not present

## 2016-07-24 DIAGNOSIS — Y999 Unspecified external cause status: Secondary | ICD-10-CM | POA: Insufficient documentation

## 2016-07-24 DIAGNOSIS — I2510 Atherosclerotic heart disease of native coronary artery without angina pectoris: Secondary | ICD-10-CM | POA: Insufficient documentation

## 2016-07-24 DIAGNOSIS — W268XXA Contact with other sharp object(s), not elsewhere classified, initial encounter: Secondary | ICD-10-CM | POA: Insufficient documentation

## 2016-07-24 DIAGNOSIS — Y9271 Barn as the place of occurrence of the external cause: Secondary | ICD-10-CM | POA: Diagnosis not present

## 2016-07-24 DIAGNOSIS — W19XXXA Unspecified fall, initial encounter: Secondary | ICD-10-CM

## 2016-07-24 DIAGNOSIS — S6991XA Unspecified injury of right wrist, hand and finger(s), initial encounter: Secondary | ICD-10-CM | POA: Diagnosis present

## 2016-07-24 DIAGNOSIS — S61411A Laceration without foreign body of right hand, initial encounter: Secondary | ICD-10-CM | POA: Insufficient documentation

## 2016-07-24 DIAGNOSIS — Z7984 Long term (current) use of oral hypoglycemic drugs: Secondary | ICD-10-CM | POA: Insufficient documentation

## 2016-07-24 DIAGNOSIS — Y939 Activity, unspecified: Secondary | ICD-10-CM | POA: Diagnosis not present

## 2016-07-24 DIAGNOSIS — F17229 Nicotine dependence, chewing tobacco, with unspecified nicotine-induced disorders: Secondary | ICD-10-CM | POA: Insufficient documentation

## 2016-07-24 DIAGNOSIS — Z79899 Other long term (current) drug therapy: Secondary | ICD-10-CM | POA: Diagnosis not present

## 2016-07-24 LAB — COMPREHENSIVE METABOLIC PANEL
ALT: 15 U/L — AB (ref 17–63)
AST: 34 U/L (ref 15–41)
Albumin: 4.1 g/dL (ref 3.5–5.0)
Alkaline Phosphatase: 71 U/L (ref 38–126)
Anion gap: 13 (ref 5–15)
BILIRUBIN TOTAL: 0.8 mg/dL (ref 0.3–1.2)
BUN: 27 mg/dL — AB (ref 6–20)
CO2: 22 mmol/L (ref 22–32)
CREATININE: 1.81 mg/dL — AB (ref 0.61–1.24)
Calcium: 9.4 mg/dL (ref 8.9–10.3)
Chloride: 104 mmol/L (ref 101–111)
GFR calc Af Amer: 40 mL/min — ABNORMAL LOW (ref >60)
GFR, EST NON AFRICAN AMERICAN: 35 mL/min — AB (ref >60)
GLUCOSE: 78 mg/dL (ref 65–99)
Potassium: 3.2 mmol/L — ABNORMAL LOW (ref 3.5–5.1)
Sodium: 139 mmol/L (ref 135–145)
TOTAL PROTEIN: 7.1 g/dL (ref 6.5–8.1)

## 2016-07-24 LAB — CBC WITH DIFFERENTIAL/PLATELET
BASOS ABS: 0 10*3/uL (ref 0.0–0.1)
Basophils Relative: 0 %
Eosinophils Absolute: 0 10*3/uL (ref 0.0–0.7)
Eosinophils Relative: 0 %
HEMATOCRIT: 37.2 % — AB (ref 39.0–52.0)
HEMOGLOBIN: 12.4 g/dL — AB (ref 13.0–17.0)
LYMPHS PCT: 13 %
Lymphs Abs: 1.7 10*3/uL (ref 0.7–4.0)
MCH: 30.2 pg (ref 26.0–34.0)
MCHC: 33.3 g/dL (ref 30.0–36.0)
MCV: 90.7 fL (ref 78.0–100.0)
Monocytes Absolute: 1.2 10*3/uL — ABNORMAL HIGH (ref 0.1–1.0)
Monocytes Relative: 9 %
NEUTROS ABS: 10 10*3/uL — AB (ref 1.7–7.7)
NEUTROS PCT: 78 %
Platelets: 166 10*3/uL (ref 150–400)
RBC: 4.1 MIL/uL — AB (ref 4.22–5.81)
RDW: 14.1 % (ref 11.5–15.5)
WBC: 12.8 10*3/uL — AB (ref 4.0–10.5)

## 2016-07-24 LAB — URINALYSIS, ROUTINE W REFLEX MICROSCOPIC
BILIRUBIN URINE: NEGATIVE
GLUCOSE, UA: NEGATIVE mg/dL
HGB URINE DIPSTICK: NEGATIVE
Ketones, ur: NEGATIVE mg/dL
Leukocytes, UA: NEGATIVE
Nitrite: NEGATIVE
PROTEIN: NEGATIVE mg/dL
SPECIFIC GRAVITY, URINE: 1.025 (ref 1.005–1.030)
pH: 5.5 (ref 5.0–8.0)

## 2016-07-24 LAB — TROPONIN I

## 2016-07-24 LAB — CK: Total CK: 346 U/L (ref 49–397)

## 2016-07-24 MED ORDER — SODIUM CHLORIDE 0.9 % IV BOLUS (SEPSIS)
1000.0000 mL | Freq: Once | INTRAVENOUS | Status: AC
  Administered 2016-07-24: 1000 mL via INTRAVENOUS
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
## 2016-07-24 NOTE — Discharge Instructions (Signed)
Tests show no life-threatening condition. Increase fluids. Follow-up your primary care doctor.

## 2016-07-24 NOTE — ED Triage Notes (Signed)
Pt brought in by grandson for multiple falls today and dizziness. Pt states he fell earlier today in the barn. Pt was unable to get up so he sat in hot barn for four hours. Pt fell again later this afternoon and cut his RT hand on the mailbox. Pt states every time he fell he did feel dizzy.

## 2016-07-26 ENCOUNTER — Emergency Department (HOSPITAL_COMMUNITY)
Admission: EM | Admit: 2016-07-26 | Discharge: 2016-07-26 | Disposition: A | Discharge status: Home / Self Care | Payer: Medicare Other | Attending: Emergency Medicine | Admitting: Emergency Medicine

## 2016-07-26 ENCOUNTER — Emergency Department (HOSPITAL_COMMUNITY): Payer: Medicare Other

## 2016-07-26 DIAGNOSIS — Z7984 Long term (current) use of oral hypoglycemic drugs: Secondary | ICD-10-CM | POA: Insufficient documentation

## 2016-07-26 DIAGNOSIS — Z79899 Other long term (current) drug therapy: Secondary | ICD-10-CM | POA: Insufficient documentation

## 2016-07-26 DIAGNOSIS — Z8673 Personal history of transient ischemic attack (TIA), and cerebral infarction without residual deficits: Secondary | ICD-10-CM | POA: Insufficient documentation

## 2016-07-26 DIAGNOSIS — R410 Disorientation, unspecified: Secondary | ICD-10-CM | POA: Diagnosis not present

## 2016-07-26 DIAGNOSIS — I10 Essential (primary) hypertension: Secondary | ICD-10-CM | POA: Insufficient documentation

## 2016-07-26 DIAGNOSIS — Z7982 Long term (current) use of aspirin: Secondary | ICD-10-CM | POA: Diagnosis not present

## 2016-07-26 DIAGNOSIS — E11649 Type 2 diabetes mellitus with hypoglycemia without coma: Secondary | ICD-10-CM | POA: Diagnosis not present

## 2016-07-26 DIAGNOSIS — Z951 Presence of aortocoronary bypass graft: Secondary | ICD-10-CM | POA: Insufficient documentation

## 2016-07-26 DIAGNOSIS — R4182 Altered mental status, unspecified: Secondary | ICD-10-CM | POA: Diagnosis present

## 2016-07-26 DIAGNOSIS — E162 Hypoglycemia, unspecified: Secondary | ICD-10-CM

## 2016-07-26 DIAGNOSIS — I2510 Atherosclerotic heart disease of native coronary artery without angina pectoris: Secondary | ICD-10-CM | POA: Insufficient documentation

## 2016-07-26 LAB — COMPREHENSIVE METABOLIC PANEL
ALBUMIN: 3.2 g/dL — AB (ref 3.5–5.0)
ALT: 16 U/L — ABNORMAL LOW (ref 17–63)
ANION GAP: 10 (ref 5–15)
AST: 52 U/L — AB (ref 15–41)
Alkaline Phosphatase: 65 U/L (ref 38–126)
BUN: 30 mg/dL — AB (ref 6–20)
CHLORIDE: 104 mmol/L (ref 101–111)
CO2: 24 mmol/L (ref 22–32)
Calcium: 9 mg/dL (ref 8.9–10.3)
Creatinine, Ser: 1.63 mg/dL — ABNORMAL HIGH (ref 0.61–1.24)
GFR calc Af Amer: 46 mL/min — ABNORMAL LOW (ref >60)
GFR, EST NON AFRICAN AMERICAN: 39 mL/min — AB (ref >60)
GLUCOSE: 365 mg/dL — AB (ref 65–99)
POTASSIUM: 3.5 mmol/L (ref 3.5–5.1)
Sodium: 138 mmol/L (ref 135–145)
Total Bilirubin: 0.9 mg/dL (ref 0.3–1.2)
Total Protein: 5.8 g/dL — ABNORMAL LOW (ref 6.5–8.1)

## 2016-07-26 LAB — CBC WITH DIFFERENTIAL/PLATELET
BASOS ABS: 0 10*3/uL (ref 0.0–0.1)
BASOS PCT: 0 %
EOS PCT: 0 %
Eosinophils Absolute: 0 10*3/uL (ref 0.0–0.7)
HCT: 38.2 % — ABNORMAL LOW (ref 39.0–52.0)
Hemoglobin: 12.3 g/dL — ABNORMAL LOW (ref 13.0–17.0)
Lymphocytes Relative: 13 %
Lymphs Abs: 1.1 10*3/uL (ref 0.7–4.0)
MCH: 29.8 pg (ref 26.0–34.0)
MCHC: 32.2 g/dL (ref 30.0–36.0)
MCV: 92.5 fL (ref 78.0–100.0)
MONO ABS: 1 10*3/uL (ref 0.1–1.0)
Monocytes Relative: 12 %
Neutro Abs: 6.4 10*3/uL (ref 1.7–7.7)
Neutrophils Relative %: 75 %
PLATELETS: 154 10*3/uL (ref 150–400)
RBC: 4.13 MIL/uL — ABNORMAL LOW (ref 4.22–5.81)
RDW: 14.6 % (ref 11.5–15.5)
WBC: 8.6 10*3/uL (ref 4.0–10.5)

## 2016-07-26 LAB — I-STAT TROPONIN, ED: TROPONIN I, POC: 0 ng/mL (ref 0.00–0.08)

## 2016-07-26 LAB — URINALYSIS, ROUTINE W REFLEX MICROSCOPIC
Bilirubin Urine: NEGATIVE
Glucose, UA: NEGATIVE mg/dL
Ketones, ur: 15 mg/dL — AB
LEUKOCYTES UA: NEGATIVE
Nitrite: NEGATIVE
Protein, ur: NEGATIVE mg/dL
SPECIFIC GRAVITY, URINE: 1.025 (ref 1.005–1.030)
pH: 5 (ref 5.0–8.0)

## 2016-07-26 LAB — CBG MONITORING, ED: GLUCOSE-CAPILLARY: 91 mg/dL (ref 65–99)

## 2016-07-26 LAB — URINE MICROSCOPIC-ADD ON

## 2016-07-26 MED ORDER — GLIPIZIDE 5 MG PO TABS: 5.0000 mg | ORAL_TABLET | Freq: Every day | ORAL | 0 refills | Status: DC

## 2016-07-26 NOTE — ED Notes (Signed)
Pt.arrived soil with stooll &urine .clean pt.up and change linen .place clean linen and daiper on.socks [yellowsocks].took trash [facility] to [facility] [employer].also linen
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
## 2016-07-26 NOTE — ED Triage Notes (Signed)
Per EMS, patient last seen normal at 2200 last night. Family found him at [id number] with "stroke like" symptoms, altered mental status and slurred speech. BP 196/107 on route, and blood glucose 59. Given D50 and rechecked at 180. Pt smelled strongly of feces/urine on ED arrival, pants visibly soiled.

## 2016-07-26 NOTE — ED Notes (Signed)
Gave pt.happymeal toeat.with soft drink

## 2016-07-26 NOTE — Discharge Instructions (Signed)
Stop taking the 10mg  glipizide in the morning, start taking the 5mg  dose that I have written for you. Continue your metformin as normal. Recommend to check your blood sugar before meals and at night. Follow-up with your primary care doctor. Return to the ED for new or worsening symptoms.

## 2016-07-27 LAB — URINE CULTURE: CULTURE: NO GROWTH

## 2017-05-19 ENCOUNTER — Encounter (HOSPITAL_COMMUNITY): Payer: Self-pay | Admitting: *Deleted

## 2017-05-19 ENCOUNTER — Emergency Department (HOSPITAL_COMMUNITY)
Admission: EM | Admit: 2017-05-19 | Discharge: 2017-05-20 | Disposition: A | Discharge status: Home / Self Care | Payer: Medicare Other | Attending: Emergency Medicine | Admitting: Emergency Medicine

## 2017-05-19 ENCOUNTER — Emergency Department (HOSPITAL_COMMUNITY): Payer: Medicare Other

## 2017-05-19 DIAGNOSIS — Z7982 Long term (current) use of aspirin: Secondary | ICD-10-CM | POA: Insufficient documentation

## 2017-05-19 DIAGNOSIS — M5431 Sciatica, right side: Secondary | ICD-10-CM

## 2017-05-19 DIAGNOSIS — I10 Essential (primary) hypertension: Secondary | ICD-10-CM | POA: Diagnosis not present

## 2017-05-19 DIAGNOSIS — E119 Type 2 diabetes mellitus without complications: Secondary | ICD-10-CM | POA: Diagnosis not present

## 2017-05-19 DIAGNOSIS — Z7984 Long term (current) use of oral hypoglycemic drugs: Secondary | ICD-10-CM | POA: Diagnosis not present

## 2017-05-19 DIAGNOSIS — Z79899 Other long term (current) drug therapy: Secondary | ICD-10-CM | POA: Insufficient documentation

## 2017-05-19 DIAGNOSIS — M545 Low back pain: Secondary | ICD-10-CM | POA: Diagnosis present

## 2017-05-19 DIAGNOSIS — M5441 Lumbago with sciatica, right side: Secondary | ICD-10-CM | POA: Diagnosis not present

## 2017-05-19 DIAGNOSIS — F17290 Nicotine dependence, other tobacco product, uncomplicated: Secondary | ICD-10-CM | POA: Diagnosis not present

## 2017-05-19 LAB — BASIC METABOLIC PANEL
Anion gap: 9 (ref 5–15)
BUN: 32 mg/dL — ABNORMAL HIGH (ref 6–20)
CALCIUM: 9.4 mg/dL (ref 8.9–10.3)
CO2: 29 mmol/L (ref 22–32)
CREATININE: 1.7 mg/dL — AB (ref 0.61–1.24)
Chloride: 104 mmol/L (ref 101–111)
GFR, EST AFRICAN AMERICAN: 43 mL/min — AB (ref >60)
GFR, EST NON AFRICAN AMERICAN: 37 mL/min — AB (ref >60)
GLUCOSE: 167 mg/dL — AB (ref 65–99)
Potassium: 3.4 mmol/L — ABNORMAL LOW (ref 3.5–5.1)
Sodium: 142 mmol/L (ref 135–145)

## 2017-05-19 LAB — CBC WITH DIFFERENTIAL/PLATELET
BASOS PCT: 0 %
Basophils Absolute: 0 10*3/uL (ref 0.0–0.1)
EOS ABS: 0.1 10*3/uL (ref 0.0–0.7)
Eosinophils Relative: 1 %
HCT: 35.9 % — ABNORMAL LOW (ref 39.0–52.0)
Hemoglobin: 12 g/dL — ABNORMAL LOW (ref 13.0–17.0)
Lymphocytes Relative: 24 %
Lymphs Abs: 1.9 10*3/uL (ref 0.7–4.0)
MCH: 30 pg (ref 26.0–34.0)
MCHC: 33.4 g/dL (ref 30.0–36.0)
MCV: 89.8 fL (ref 78.0–100.0)
MONO ABS: 0.6 10*3/uL (ref 0.1–1.0)
Monocytes Relative: 8 %
Neutro Abs: 5.2 10*3/uL (ref 1.7–7.7)
Neutrophils Relative %: 67 %
PLATELETS: 135 10*3/uL — AB (ref 150–400)
RBC: 4 MIL/uL — ABNORMAL LOW (ref 4.22–5.81)
RDW: 14.1 % (ref 11.5–15.5)
WBC: 7.9 10*3/uL (ref 4.0–10.5)

## 2017-05-19 LAB — URINALYSIS, ROUTINE W REFLEX MICROSCOPIC
Bilirubin Urine: NEGATIVE
Glucose, UA: NEGATIVE mg/dL
Hgb urine dipstick: NEGATIVE
KETONES UR: NEGATIVE mg/dL
LEUKOCYTES UA: NEGATIVE
NITRITE: NEGATIVE
PH: 5 (ref 5.0–8.0)
PROTEIN: NEGATIVE mg/dL
Specific Gravity, Urine: 1.02 (ref 1.005–1.030)

## 2017-05-19 MED ORDER — HYDROMORPHONE HCL 1 MG/ML IJ SOLN
0.5000 mg | Freq: Once | INTRAMUSCULAR | Status: DC
  Filled 2017-05-19: qty 1

## 2017-05-19 MED ORDER — HYDROMORPHONE HCL 1 MG/ML IJ SOLN
0.5000 mg | Freq: Once | INTRAMUSCULAR | Status: AC
Start: 2017-05-19 — End: 2017-05-19
  Administered 2017-05-19: 0.5 mg via INTRAVENOUS
  Filled 2017-05-19: qty 1

## 2017-05-19 NOTE — ED Notes (Signed)
Pt and family report that pt has had back pain and although a patient of Dr Juanetta GoslingHawkins, her insisted upon seeing a chiropractor.  Daughter is giving pt 400 mg ibuprofen twice daily and states the meds are not holding him'   Pt complains of pelvic pain with radiation down his R lateral leg
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
## 2017-05-19 NOTE — ED Triage Notes (Signed)
Pt with chronic back pain that radiates down leg. Seeing chiropractor for same.  Pain not any better.  Weakness in right leg.

## 2017-05-20 MED ORDER — HYDROCODONE-ACETAMINOPHEN 5-325 MG PO TABS: ORAL_TABLET | ORAL | 0 refills | Status: DC

## 2017-05-20 MED ORDER — HYDROMORPHONE HCL 1 MG/ML IJ SOLN
0.5000 mg | Freq: Once | INTRAMUSCULAR | Status: AC
  Administered 2017-05-20: 0.5 mg via INTRAMUSCULAR
  Filled 2017-05-20: qty 1

## 2017-05-20 NOTE — Discharge Instructions (Signed)
Call Dr. Michelle NasutiKnowlton's office tomorrow to arrange a follow-up appt.  Return here for any worsening symtpoms

## 2017-05-20 NOTE — ED Notes (Signed)
Family report that pt takes 3 blood pressure medications in the evening and has not had those dosages tonight,

## 2017-05-20 NOTE — ED Notes (Signed)
Pt and family updated on plan of care,  

## 2018-01-21 ENCOUNTER — Other Ambulatory Visit (HOSPITAL_COMMUNITY): Payer: Self-pay | Admitting: Family Medicine

## 2018-01-21 DIAGNOSIS — N183 Chronic kidney disease, stage 3 unspecified: Secondary | ICD-10-CM

## 2018-01-21 DIAGNOSIS — R7989 Other specified abnormal findings of blood chemistry: Secondary | ICD-10-CM

## 2018-01-23 ENCOUNTER — Ambulatory Visit (HOSPITAL_COMMUNITY)
Admission: RE | Admit: 2018-01-23 | Discharge: 2018-01-23 | Disposition: A | Discharge status: Home / Self Care | Payer: Medicare Other | Source: Ambulatory Visit | Attending: Family Medicine | Admitting: Family Medicine

## 2018-01-23 ENCOUNTER — Ambulatory Visit (HOSPITAL_COMMUNITY): Payer: Medicare Other

## 2018-01-23 DIAGNOSIS — R7989 Other specified abnormal findings of blood chemistry: Secondary | ICD-10-CM | POA: Diagnosis not present

## 2018-01-23 DIAGNOSIS — N183 Chronic kidney disease, stage 3 unspecified: Secondary | ICD-10-CM

## 2018-01-23 DIAGNOSIS — N281 Cyst of kidney, acquired: Secondary | ICD-10-CM | POA: Diagnosis not present

## 2018-06-12 ENCOUNTER — Encounter (HOSPITAL_COMMUNITY): Payer: Self-pay | Admitting: Emergency Medicine

## 2018-06-12 ENCOUNTER — Emergency Department (HOSPITAL_COMMUNITY): Payer: Medicare Other

## 2018-06-12 ENCOUNTER — Other Ambulatory Visit: Payer: Self-pay

## 2018-06-12 ENCOUNTER — Inpatient Hospital Stay (HOSPITAL_COMMUNITY)
Admission: EM | Admit: 2018-06-12 | Discharge: 2018-06-18 | DRG: 065 | Disposition: A | Discharge status: Rehabilitation Facility | Payer: Medicare Other | Attending: Neurology | Admitting: Neurology

## 2018-06-12 DIAGNOSIS — N183 Chronic kidney disease, stage 3 unspecified: Secondary | ICD-10-CM

## 2018-06-12 DIAGNOSIS — F419 Anxiety disorder, unspecified: Secondary | ICD-10-CM | POA: Diagnosis present

## 2018-06-12 DIAGNOSIS — F0391 Unspecified dementia with behavioral disturbance: Secondary | ICD-10-CM | POA: Diagnosis present

## 2018-06-12 DIAGNOSIS — S062X9S Diffuse traumatic brain injury with loss of consciousness of unspecified duration, sequela: Secondary | ICD-10-CM | POA: Diagnosis not present

## 2018-06-12 DIAGNOSIS — H53461 Homonymous bilateral field defects, right side: Secondary | ICD-10-CM | POA: Diagnosis present

## 2018-06-12 DIAGNOSIS — R269 Unspecified abnormalities of gait and mobility: Secondary | ICD-10-CM | POA: Diagnosis not present

## 2018-06-12 DIAGNOSIS — Z720 Tobacco use: Secondary | ICD-10-CM | POA: Diagnosis not present

## 2018-06-12 DIAGNOSIS — I10 Essential (primary) hypertension: Secondary | ICD-10-CM | POA: Diagnosis present

## 2018-06-12 DIAGNOSIS — F05 Delirium due to known physiological condition: Secondary | ICD-10-CM | POA: Diagnosis present

## 2018-06-12 DIAGNOSIS — E876 Hypokalemia: Secondary | ICD-10-CM | POA: Diagnosis present

## 2018-06-12 DIAGNOSIS — I440 Atrioventricular block, first degree: Secondary | ICD-10-CM | POA: Diagnosis present

## 2018-06-12 DIAGNOSIS — I161 Hypertensive emergency: Secondary | ICD-10-CM | POA: Diagnosis not present

## 2018-06-12 DIAGNOSIS — F0390 Unspecified dementia without behavioral disturbance: Secondary | ICD-10-CM | POA: Diagnosis not present

## 2018-06-12 DIAGNOSIS — F03918 Unspecified dementia, unspecified severity, with other behavioral disturbance: Secondary | ICD-10-CM | POA: Diagnosis present

## 2018-06-12 DIAGNOSIS — R0682 Tachypnea, not elsewhere classified: Secondary | ICD-10-CM | POA: Diagnosis present

## 2018-06-12 DIAGNOSIS — R7989 Other specified abnormal findings of blood chemistry: Secondary | ICD-10-CM | POA: Diagnosis not present

## 2018-06-12 DIAGNOSIS — I611 Nontraumatic intracerebral hemorrhage in hemisphere, cortical: Secondary | ICD-10-CM | POA: Diagnosis present

## 2018-06-12 DIAGNOSIS — I615 Nontraumatic intracerebral hemorrhage, intraventricular: Secondary | ICD-10-CM

## 2018-06-12 DIAGNOSIS — E1165 Type 2 diabetes mellitus with hyperglycemia: Secondary | ICD-10-CM | POA: Diagnosis not present

## 2018-06-12 DIAGNOSIS — I2510 Atherosclerotic heart disease of native coronary artery without angina pectoris: Secondary | ICD-10-CM | POA: Diagnosis not present

## 2018-06-12 DIAGNOSIS — F329 Major depressive disorder, single episode, unspecified: Secondary | ICD-10-CM | POA: Diagnosis not present

## 2018-06-12 DIAGNOSIS — R509 Fever, unspecified: Secondary | ICD-10-CM

## 2018-06-12 DIAGNOSIS — J9811 Atelectasis: Secondary | ICD-10-CM | POA: Diagnosis not present

## 2018-06-12 DIAGNOSIS — Z781 Physical restraint status: Secondary | ICD-10-CM

## 2018-06-12 DIAGNOSIS — I129 Hypertensive chronic kidney disease with stage 1 through stage 4 chronic kidney disease, or unspecified chronic kidney disease: Secondary | ICD-10-CM | POA: Diagnosis not present

## 2018-06-12 DIAGNOSIS — Z823 Family history of stroke: Secondary | ICD-10-CM

## 2018-06-12 DIAGNOSIS — F32A Depression, unspecified: Secondary | ICD-10-CM

## 2018-06-12 DIAGNOSIS — Z951 Presence of aortocoronary bypass graft: Secondary | ICD-10-CM

## 2018-06-12 DIAGNOSIS — E119 Type 2 diabetes mellitus without complications: Secondary | ICD-10-CM | POA: Diagnosis not present

## 2018-06-12 DIAGNOSIS — I610 Nontraumatic intracerebral hemorrhage in hemisphere, subcortical: Secondary | ICD-10-CM | POA: Diagnosis not present

## 2018-06-12 DIAGNOSIS — D72829 Elevated white blood cell count, unspecified: Secondary | ICD-10-CM | POA: Diagnosis present

## 2018-06-12 DIAGNOSIS — Z820 Family history of epilepsy and other diseases of the nervous system: Secondary | ICD-10-CM

## 2018-06-12 DIAGNOSIS — Y92009 Unspecified place in unspecified non-institutional (private) residence as the place of occurrence of the external cause: Secondary | ICD-10-CM

## 2018-06-12 DIAGNOSIS — D62 Acute posthemorrhagic anemia: Secondary | ICD-10-CM | POA: Diagnosis not present

## 2018-06-12 DIAGNOSIS — Z8249 Family history of ischemic heart disease and other diseases of the circulatory system: Secondary | ICD-10-CM

## 2018-06-12 DIAGNOSIS — G301 Alzheimer's disease with late onset: Secondary | ICD-10-CM | POA: Diagnosis not present

## 2018-06-12 DIAGNOSIS — Z833 Family history of diabetes mellitus: Secondary | ICD-10-CM

## 2018-06-12 DIAGNOSIS — E785 Hyperlipidemia, unspecified: Secondary | ICD-10-CM | POA: Diagnosis not present

## 2018-06-12 DIAGNOSIS — R4701 Aphasia: Secondary | ICD-10-CM | POA: Diagnosis not present

## 2018-06-12 DIAGNOSIS — E1122 Type 2 diabetes mellitus with diabetic chronic kidney disease: Secondary | ICD-10-CM | POA: Diagnosis present

## 2018-06-12 DIAGNOSIS — S065X9A Traumatic subdural hemorrhage with loss of consciousness of unspecified duration, initial encounter: Secondary | ICD-10-CM | POA: Diagnosis not present

## 2018-06-12 DIAGNOSIS — F0281 Dementia in other diseases classified elsewhere with behavioral disturbance: Secondary | ICD-10-CM | POA: Diagnosis not present

## 2018-06-12 DIAGNOSIS — R1312 Dysphagia, oropharyngeal phase: Secondary | ICD-10-CM | POA: Diagnosis not present

## 2018-06-12 DIAGNOSIS — S069X2S Unspecified intracranial injury with loss of consciousness of 31 minutes to 59 minutes, sequela: Secondary | ICD-10-CM | POA: Diagnosis not present

## 2018-06-12 DIAGNOSIS — W19XXXA Unspecified fall, initial encounter: Secondary | ICD-10-CM | POA: Diagnosis not present

## 2018-06-12 DIAGNOSIS — S06359D Traumatic hemorrhage of left cerebrum with loss of consciousness of unspecified duration, subsequent encounter: Secondary | ICD-10-CM | POA: Diagnosis not present

## 2018-06-12 DIAGNOSIS — I25810 Atherosclerosis of coronary artery bypass graft(s) without angina pectoris: Secondary | ICD-10-CM | POA: Diagnosis not present

## 2018-06-12 DIAGNOSIS — Z7984 Long term (current) use of oral hypoglycemic drugs: Secondary | ICD-10-CM

## 2018-06-12 DIAGNOSIS — J181 Lobar pneumonia, unspecified organism: Secondary | ICD-10-CM | POA: Diagnosis not present

## 2018-06-12 DIAGNOSIS — N179 Acute kidney failure, unspecified: Secondary | ICD-10-CM | POA: Diagnosis present

## 2018-06-12 DIAGNOSIS — Z8673 Personal history of transient ischemic attack (TIA), and cerebral infarction without residual deficits: Secondary | ICD-10-CM | POA: Diagnosis not present

## 2018-06-12 DIAGNOSIS — I619 Nontraumatic intracerebral hemorrhage, unspecified: Secondary | ICD-10-CM

## 2018-06-12 DIAGNOSIS — R410 Disorientation, unspecified: Secondary | ICD-10-CM | POA: Diagnosis present

## 2018-06-12 DIAGNOSIS — S0101XA Laceration without foreign body of scalp, initial encounter: Secondary | ICD-10-CM | POA: Diagnosis present

## 2018-06-12 DIAGNOSIS — R1310 Dysphagia, unspecified: Secondary | ICD-10-CM

## 2018-06-12 DIAGNOSIS — Z7982 Long term (current) use of aspirin: Secondary | ICD-10-CM

## 2018-06-12 DIAGNOSIS — I609 Nontraumatic subarachnoid hemorrhage, unspecified: Secondary | ICD-10-CM | POA: Diagnosis present

## 2018-06-12 DIAGNOSIS — Z888 Allergy status to other drugs, medicaments and biological substances status: Secondary | ICD-10-CM

## 2018-06-12 DIAGNOSIS — J690 Pneumonitis due to inhalation of food and vomit: Secondary | ICD-10-CM | POA: Diagnosis not present

## 2018-06-12 DIAGNOSIS — I5030 Unspecified diastolic (congestive) heart failure: Secondary | ICD-10-CM | POA: Diagnosis not present

## 2018-06-12 LAB — BASIC METABOLIC PANEL
ANION GAP: 10 (ref 5–15)
BUN: 24 mg/dL — ABNORMAL HIGH (ref 8–23)
CALCIUM: 9.5 mg/dL (ref 8.9–10.3)
CO2: 28 mmol/L (ref 22–32)
Chloride: 104 mmol/L (ref 98–111)
Creatinine, Ser: 2.07 mg/dL — ABNORMAL HIGH (ref 0.61–1.24)
GFR, EST AFRICAN AMERICAN: 34 mL/min — AB (ref >60)
GFR, EST NON AFRICAN AMERICAN: 29 mL/min — AB (ref >60)
Glucose, Bld: 153 mg/dL — ABNORMAL HIGH (ref 70–99)
POTASSIUM: 4 mmol/L (ref 3.5–5.1)
SODIUM: 142 mmol/L (ref 135–145)

## 2018-06-12 LAB — CBC WITH DIFFERENTIAL/PLATELET
BASOS ABS: 0 10*3/uL (ref 0.0–0.1)
BASOS PCT: 0 %
EOS ABS: 0.1 10*3/uL (ref 0.0–0.7)
EOS PCT: 1 %
HCT: 37.9 % — ABNORMAL LOW (ref 39.0–52.0)
Hemoglobin: 12.4 g/dL — ABNORMAL LOW (ref 13.0–17.0)
Lymphocytes Relative: 18 %
Lymphs Abs: 1.7 10*3/uL (ref 0.7–4.0)
MCH: 30.2 pg (ref 26.0–34.0)
MCHC: 32.7 g/dL (ref 30.0–36.0)
MCV: 92.4 fL (ref 78.0–100.0)
Monocytes Absolute: 0.7 10*3/uL (ref 0.1–1.0)
Monocytes Relative: 7 %
Neutro Abs: 6.7 10*3/uL (ref 1.7–7.7)
Neutrophils Relative %: 74 %
PLATELETS: 167 10*3/uL (ref 150–400)
RBC: 4.1 MIL/uL — AB (ref 4.22–5.81)
RDW: 13.8 % (ref 11.5–15.5)
WBC: 9.2 10*3/uL (ref 4.0–10.5)

## 2018-06-12 LAB — CBG MONITORING, ED
GLUCOSE-CAPILLARY: 171 mg/dL — AB (ref 70–99)
Glucose-Capillary: 111 mg/dL — ABNORMAL HIGH (ref 70–99)

## 2018-06-12 MED ORDER — SODIUM CHLORIDE 0.9 % IV BOLUS
1000.0000 mL | Freq: Once | INTRAVENOUS | Status: AC
  Administered 2018-06-12: 1000 mL via INTRAVENOUS

## 2018-06-12 MED ORDER — NICARDIPINE HCL IN NACL 20-0.86 MG/200ML-% IV SOLN
0.0000 mg/h | INTRAVENOUS | Status: DC
  Administered 2018-06-12: 5 mg/h via INTRAVENOUS
  Filled 2018-06-12: qty 200

## 2018-06-12 NOTE — ED Triage Notes (Signed)
Pt came in with gapping lac to left side of back of head. Pt states has no idea how it happened. Pt is a/o. C/o pain to this area. 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 Pt denies passing out or feeling bad today. Bleeding is now controlled. Denies any other sxs

## 2018-06-12 NOTE — ED Notes (Signed)
Spoke with Dr. Jacqulyn BathLong about pt and vo for CT head and EKG to be done. vo read back and verified. Nad.

## 2018-06-12 NOTE — ED Notes (Signed)
Dressing applied to head laceration.  
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
## 2018-06-13 ENCOUNTER — Inpatient Hospital Stay (HOSPITAL_COMMUNITY): Payer: Medicare Other

## 2018-06-13 DIAGNOSIS — I619 Nontraumatic intracerebral hemorrhage, unspecified: Secondary | ICD-10-CM

## 2018-06-13 LAB — MRSA PCR SCREENING: MRSA by PCR: NEGATIVE

## 2018-06-13 LAB — GLUCOSE, CAPILLARY
GLUCOSE-CAPILLARY: 70 mg/dL (ref 70–99)
Glucose-Capillary: 78 mg/dL (ref 70–99)
Glucose-Capillary: 135 mg/dL — ABNORMAL HIGH (ref 70–99)
Glucose-Capillary: 71 mg/dL (ref 70–99)

## 2018-06-13 LAB — CBC
HCT: 38 % — ABNORMAL LOW (ref 39.0–52.0)
HEMOGLOBIN: 12.4 g/dL — AB (ref 13.0–17.0)
MCH: 30 pg (ref 26.0–34.0)
MCHC: 32.6 g/dL (ref 30.0–36.0)
MCV: 92 fL (ref 78.0–100.0)
PLATELETS: 175 10*3/uL (ref 150–400)
RBC: 4.13 MIL/uL — AB (ref 4.22–5.81)
RDW: 13.4 % (ref 11.5–15.5)
WBC: 12 10*3/uL — AB (ref 4.0–10.5)

## 2018-06-13 LAB — PROTIME-INR
INR: 1.04
PROTHROMBIN TIME: 13.5 s (ref 11.4–15.2)

## 2018-06-13 LAB — APTT: APTT: 31 s (ref 24–36)

## 2018-06-13 MED ORDER — DONEPEZIL HCL 10 MG PO TABS
10.0000 mg | ORAL_TABLET | Freq: Every day | ORAL | Status: DC
  Administered 2018-06-13 – 2018-06-17 (×5): 10 mg via ORAL
  Filled 2018-06-13 (×5): qty 1

## 2018-06-13 MED ORDER — HYDROCHLOROTHIAZIDE 12.5 MG PO CAPS
12.5000 mg | ORAL_CAPSULE | Freq: Every day | ORAL | Status: DC
  Administered 2018-06-13: 12.5 mg via ORAL
  Filled 2018-06-13: qty 1

## 2018-06-13 MED ORDER — ACETAMINOPHEN 325 MG PO TABS
650.0000 mg | ORAL_TABLET | ORAL | Status: DC | PRN
  Administered 2018-06-15: 650 mg via ORAL
  Filled 2018-06-13: qty 2

## 2018-06-13 MED ORDER — STROKE: EARLY STAGES OF RECOVERY BOOK
Freq: Once | Status: DC
  Filled 2018-06-13: qty 1

## 2018-06-13 MED ORDER — ROSUVASTATIN CALCIUM 20 MG PO TABS
10.0000 mg | ORAL_TABLET | Freq: Every day | ORAL | Status: DC
  Administered 2018-06-13 – 2018-06-15 (×3): 10 mg via ORAL
  Filled 2018-06-13 (×3): qty 1

## 2018-06-13 MED ORDER — DONEPEZIL HCL 10 MG PO TABS: 10.0000 mg | ORAL_TABLET | Freq: Every day | ORAL | Status: DC

## 2018-06-13 MED ORDER — CLEVIDIPINE BUTYRATE 0.5 MG/ML IV EMUL
0.0000 mg/h | INTRAVENOUS | Status: DC
Start: 2018-06-13 — End: 2018-06-17
  Administered 2018-06-13: 13 mg/h via INTRAVENOUS
  Administered 2018-06-13: 17 mg/h via INTRAVENOUS
  Administered 2018-06-13: 15 mg/h via INTRAVENOUS
  Administered 2018-06-13: 1 mg/h via INTRAVENOUS
  Administered 2018-06-13: 16 mg/h via INTRAVENOUS
  Administered 2018-06-13: 17 mg/h via INTRAVENOUS
  Administered 2018-06-13: 19 mg/h via INTRAVENOUS
  Administered 2018-06-14 (×4): 21 mg/h via INTRAVENOUS
  Administered 2018-06-14: 8 mg/h via INTRAVENOUS
  Administered 2018-06-15: 21 mg/h via INTRAVENOUS
  Administered 2018-06-15: 8 mg/h via INTRAVENOUS
  Administered 2018-06-15 (×2): 21 mg/h via INTRAVENOUS
  Administered 2018-06-15: 6 mg/h via INTRAVENOUS
  Administered 2018-06-15 – 2018-06-16 (×2): 21 mg/h via INTRAVENOUS
  Administered 2018-06-16: 20 mg/h via INTRAVENOUS
  Administered 2018-06-16: 21 mg/h via INTRAVENOUS
  Administered 2018-06-16 (×2): 20 mg/h via INTRAVENOUS
  Administered 2018-06-16 (×2): 15 mg/h via INTRAVENOUS
  Administered 2018-06-16: 20 mg/h via INTRAVENOUS
  Administered 2018-06-17 (×2): 21 mg/h via INTRAVENOUS
  Administered 2018-06-17: 20 mg/h via INTRAVENOUS
  Filled 2018-06-13 (×8): qty 50
  Filled 2018-06-13: qty 100
  Filled 2018-06-13 (×5): qty 50
  Filled 2018-06-13 (×2): qty 100
  Filled 2018-06-13: qty 50
  Filled 2018-06-13: qty 100
  Filled 2018-06-13 (×2): qty 50
  Filled 2018-06-13: qty 100
  Filled 2018-06-13 (×3): qty 50
  Filled 2018-06-13: qty 100
  Filled 2018-06-13 (×4): qty 50
  Filled 2018-06-13: qty 100
  Filled 2018-06-13 (×2): qty 50

## 2018-06-13 MED ORDER — HALOPERIDOL LACTATE 5 MG/ML IJ SOLN
2.0000 mg | Freq: Once | INTRAMUSCULAR | Status: AC
  Administered 2018-06-13: 2 mg via INTRAVENOUS
  Filled 2018-06-13: qty 1

## 2018-06-13 MED ORDER — ACETAMINOPHEN 160 MG/5ML PO SOLN: 650.0000 mg | ORAL | Status: DC | PRN

## 2018-06-13 MED ORDER — TAMSULOSIN HCL 0.4 MG PO CAPS: 0.8000 mg | ORAL_CAPSULE | Freq: Every day | ORAL | Status: DC

## 2018-06-13 MED ORDER — INSULIN ASPART 100 UNIT/ML ~~LOC~~ SOLN
0.0000 [IU] | Freq: Three times a day (TID) | SUBCUTANEOUS | Status: DC
Start: 2018-06-13 — End: 2018-06-18
  Administered 2018-06-13: 2 [IU] via SUBCUTANEOUS
  Administered 2018-06-14: 8 [IU] via SUBCUTANEOUS
  Administered 2018-06-15: 5 [IU] via SUBCUTANEOUS
  Administered 2018-06-15 (×2): 3 [IU] via SUBCUTANEOUS
  Administered 2018-06-16: 5 [IU] via SUBCUTANEOUS
  Administered 2018-06-16: 2 [IU] via SUBCUTANEOUS
  Administered 2018-06-16: 5 [IU] via SUBCUTANEOUS
  Administered 2018-06-17: 3 [IU] via SUBCUTANEOUS
  Administered 2018-06-17: 8 [IU] via SUBCUTANEOUS
  Administered 2018-06-17 – 2018-06-18 (×3): 5 [IU] via SUBCUTANEOUS

## 2018-06-13 MED ORDER — SERTRALINE HCL 50 MG PO TABS: 100.0000 mg | ORAL_TABLET | Freq: Every day | ORAL | Status: DC

## 2018-06-13 MED ORDER — INSULIN ASPART 100 UNIT/ML ~~LOC~~ SOLN: 0.0000 [IU] | Freq: Three times a day (TID) | SUBCUTANEOUS | Status: DC

## 2018-06-13 MED ORDER — SERTRALINE HCL 100 MG PO TABS
100.0000 mg | ORAL_TABLET | Freq: Every day | ORAL | Status: DC
  Administered 2018-06-13 – 2018-06-18 (×6): 100 mg via ORAL
  Filled 2018-06-13: qty 2
  Filled 2018-06-13: qty 1
  Filled 2018-06-13 (×4): qty 2

## 2018-06-13 MED ORDER — DIPHENHYDRAMINE HCL 25 MG PO CAPS
50.0000 mg | ORAL_CAPSULE | Freq: Every day | ORAL | Status: DC
  Administered 2018-06-13: 50 mg via ORAL
  Filled 2018-06-13: qty 2

## 2018-06-13 MED ORDER — AMLODIPINE BESYLATE 10 MG PO TABS: 10.0000 mg | ORAL_TABLET | Freq: Every day | ORAL | Status: DC

## 2018-06-13 MED ORDER — AMLODIPINE BESYLATE 10 MG PO TABS
10.0000 mg | ORAL_TABLET | Freq: Every day | ORAL | Status: DC
Start: 2018-06-13 — End: 2018-06-18
  Administered 2018-06-13 – 2018-06-18 (×6): 10 mg via ORAL
  Filled 2018-06-13 (×6): qty 1

## 2018-06-13 MED ORDER — PANTOPRAZOLE SODIUM 40 MG PO TBEC
40.0000 mg | DELAYED_RELEASE_TABLET | Freq: Every day | ORAL | Status: DC
  Administered 2018-06-13 – 2018-06-15 (×3): 40 mg via ORAL
  Filled 2018-06-13 (×4): qty 1

## 2018-06-13 MED ORDER — TAMSULOSIN HCL 0.4 MG PO CAPS
0.8000 mg | ORAL_CAPSULE | Freq: Every day | ORAL | Status: DC
  Administered 2018-06-13 – 2018-06-18 (×5): 0.8 mg via ORAL
  Filled 2018-06-13 (×5): qty 2

## 2018-06-13 MED ORDER — CLONIDINE HCL 0.1 MG PO TABS
0.3000 mg | ORAL_TABLET | Freq: Two times a day (BID) | ORAL | Status: DC
  Administered 2018-06-13 – 2018-06-16 (×7): 0.3 mg via ORAL
  Filled 2018-06-13 (×6): qty 3

## 2018-06-13 MED ORDER — FERROUS SULFATE 325 (65 FE) MG PO TABS
325.0000 mg | ORAL_TABLET | Freq: Every day | ORAL | Status: DC
  Administered 2018-06-14 – 2018-06-18 (×5): 325 mg via ORAL
  Filled 2018-06-13 (×5): qty 1

## 2018-06-13 MED ORDER — INSULIN ASPART 100 UNIT/ML ~~LOC~~ SOLN: 0.0000 [IU] | Freq: Every day | SUBCUTANEOUS | Status: DC

## 2018-06-13 MED ORDER — ACETAMINOPHEN 650 MG RE SUPP
650.0000 mg | RECTAL | Status: DC | PRN
  Administered 2018-06-14: 650 mg via RECTAL
  Filled 2018-06-13: qty 1

## 2018-06-13 MED ORDER — ALPRAZOLAM 0.5 MG PO TABS
0.5000 mg | ORAL_TABLET | Freq: Two times a day (BID) | ORAL | Status: DC
  Administered 2018-06-13 – 2018-06-14 (×3): 0.5 mg via ORAL
  Filled 2018-06-13 (×3): qty 1

## 2018-06-13 MED ORDER — SENNOSIDES-DOCUSATE SODIUM 8.6-50 MG PO TABS
1.0000 | ORAL_TABLET | Freq: Two times a day (BID) | ORAL | Status: DC
  Administered 2018-06-13 – 2018-06-18 (×10): 1 via ORAL
  Filled 2018-06-13 (×10): qty 1

## 2018-06-13 MED ORDER — LISINOPRIL 20 MG PO TABS
20.0000 mg | ORAL_TABLET | Freq: Every day | ORAL | Status: DC
  Administered 2018-06-13: 20 mg via ORAL
  Filled 2018-06-13: qty 1

## 2018-06-13 NOTE — Progress Notes (Addendum)
PT Cancellation Note  Patient Details Name: Elgie Collardarl F Sweetman MRN: [id number] DOB: [birth year]   Cancelled Treatment:    Reason Eval/Treat Not Completed: Other (comment).  Transporter here to take pt to MRI.  Pt is also on active bedrest and this will need to be removed for pt to progress with therapy evals.   Thanks,    Rollene Rotundaebecca B. Takyra Cantrall, PT, DPT [phone]   06/13/2018, 10:25 AM

## 2018-06-13 NOTE — Progress Notes (Signed)
Carotid artery duplex has been completed. 1-39% ICA stenosis bilaterally.  06/13/18 2:35 PM Olen CordialGreg Oneda Duffett RVT

## 2018-06-13 NOTE — Progress Notes (Addendum)
STROKE TEAM - DAILY PROGRESS NOTE    HISTORY Mark Montoya is a 77 y.o. male with a history of stroke who presents with ICH. He went to [facility] ER due to head injury where he was found to be   hypertensive. A head CT was performed and deomnstrates multiple areas of ICH including a significant hematoma in the left temporal lobe.  Per reports patient states that he fell at home and drove to his relatives house.  They noted that he had a large amount and was bleeding from his head, was mildly confused and family brought him to the ED for evaluation.  Reports patient has some dementia, but does live grandson and is functional and continues to drive.  SUBJECTIVE Patient in bed in the ICU, continues to be confused with receptive aphasia with poor inattention and only occasionally following commands.  Patient's blood pressure more stable, currently on Cleviprex drip.  Patient does move all extremities equally and moves himself in bed without difficulty.  Patient currently not in distress at this time, denies headaches or blurred vision.  OBJECTIVE Most recent Vital Signs: Vitals:   06/13/18 1020 06/13/18 1030 06/13/18 1045 06/13/18 1246  BP: (!) 147/100 139/66 (!) 141/78   Pulse:  80 83   Resp:  19 (!) 21   Temp:    98.5 F (36.9 C)  TempSrc:    Oral  SpO2:  92% 97%    CBG (last 3)  Recent Labs    06/12/18 2244 06/13/18 0801 06/13/18 1255  GLUCAP 111* 70 78    Physical Exam  HEENT-left occipital scalp hematoma with oozing of blood.  Normal external eye and conjunctiva.  Very poor dentition Cardiovascular- S1-S2 audible, pulses palpable throughout   Lungs-no rhonchi or wheezing noted, no excessive working breathing.  Saturations within normal limits Abdomen- All 4 quadrants palpated and nontender Musculoskeletal-no joint tenderness, deformity or swelling patient does move extremities Skin-warm and dry  Neuro:  Mental  Status: Patient is awake and alert, encephalopathic with receptive aphasia, speech is clear, patient with inattention, poor naming, repetition, recall.  Patient consistently follows 1 step and some two-step commands.alert, oriented, thought content appropriate.   Cranial Nerves: II: Blinks to threat on the leftt but not on the right tIII,IV, VI: ptosis not present, extra-ocular motions intact bilaterally pupils equal, round, reactive to light . Fundi not visualized V,VII: smile symmetric, facial light touch sensation normal bilaterally VIII: hearing intact to voice IX,X: uvula rises symmetrically XI: bilateral shoulder shrug XII: midline tongue extension Motor: She does move all extremities equally Right : Upper extremity   5/5    Left:     Upper extremity   5/5  Lower extremity   5/5     Lower extremity   5/5 Tone and bulk:normal tone throughout; no atrophy noted Sensory: Patient does withdraw to stimuli and is in arms and legs  Deep Tendon Reflexes: 2+ and symmetric throughout Plantars: Right: downgoing   Left: downgoing Cerebellar: Minimally able to follow commands Mark Montoya cerebellar testing, but not ataxic with  finger-to-nose,  Gait: Not assessed  IV Fluid Intake:   . clevidipine 15 mg/hr (06/13/18 1254)    MEDICATIONS  .  stroke: mapping our early stages of recovery book   Does not apply Once  . amLODipine  10 mg Oral Daily  . cloNIDine  0.3 mg Oral BID  . donepezil  10 mg Oral QHS  . hydrochlorothiazide  12.5 mg Oral Daily  . insulin aspart  0-15 Units Subcutaneous TID WC  . lisinopril  20 mg Oral Daily  . pantoprazole  40 mg Oral Daily  . senna-docusate  1 tablet Oral BID  . sertraline  100 mg Oral Daily  . tamsulosin  0.8 mg Oral QPC breakfast   PRN:  acetaminophen **OR** acetaminophen (TYLENOL) oral liquid 160 mg/5 mL **OR** acetaminophen  Diet:   Diet Order           Diet 2 gram sodium Room service appropriate? Yes; Fluid consistency: Thin  Diet effective now            CLINICALLY SIGNIFICANT STUDIES Basic Metabolic Panel:  Recent Labs  Lab 06/12/18 1955  NA 142  K 4.0  CL 104  CO2 28  GLUCOSE 153*  BUN 24*  CREATININE 2.07*  CALCIUM 9.5   Liver Function Tests: No results for input(s): AST, ALT, ALKPHOS, BILITOT, PROT, ALBUMIN in the last 168 hours. CBC:  Recent Labs  Lab 06/12/18 1955 06/13/18 0307  WBC 9.2 12.0*  NEUTROABS 6.7  --   HGB 12.4* 12.4*  HCT 37.9* 38.0*  MCV 92.4 92.0  PLT 167 175   Coagulation:  Recent Labs  Lab 06/13/18 0307  LABPROT 13.5  INR 1.04    Ct Head Wo Contrast 06/13/2018 IMPRESSION:  1. Slight increase in size of left temporal lobe intraparenchymal hematoma without midline shift or new mass effect. 2. Increased intraventricular blood in the left temporal horn without hydrocephalus. 3. Unchanged small volume subarachnoid hemorrhage along the right sylvian fissure.   Ct Head Wo Contrast 06/12/2018 IMPRESSION:  1. 5.5 cc LEFT temporal lobe hematoma likely posttraumatic given head injury though hypertensive etiology possible. Small volume LEFT lateral intraventricular hemorrhage. Small volume subarachnoid hemorrhage. 2. Moderate LEFT scalp hematoma.  No skull fracture. 3. Multiple old small infarcts. Mild chronic small vessel ischemic changes.   EKG  SR with 1st degree AV block   MRI Brain/MRA Head IMPRESSION: 1. Left temporal lobe hemorrhage with intraventricular extension appears stable since the CT earlier today. Intra-axial blood products estimated at 17 mL. Surrounding edema. Mild regional mass effect. 2. Small volume but more widespread bilateral subarachnoid hemorrhage than was evident by CT. Small volume intraventricular hemorrhage with no ventriculomegaly. 3. No acute infarct or new intracranial abnormality. 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 4. Motion degraded intracranial MRA with no large vessel occlusion or stenosis  identified.  Outstanding Stroke Work-up Studies:     Echocardiogram:                                                    PENDING B/L Carotid U/S:                                                     PENDING    ASSESSMENT/PLAN Stroke:   Intracranial hemorrhage with left lateral intraventricular hemorrhage, small  volume subarachnoid hemorrhage, 5.5 cc left temporal lobe hematoma etiology indeterminate but likely related to  uncontrolled hypertension.Imaging does not show any other evidence to suggest amyloid angiopathy  Patient also status post trauma post a fall with moderate left scalp hematoma secondary to trauma but location of primary brain hematoma is not typical for a posttraumatic hemorrhage and it's more likely that his head trauma is secondary to his intracerebral hemorrhage  Code Stroke: No  CTA head & neck to be performed secondary to renal function  CT of the brain left lateral intraventricular hemorrhage, small volume subarachnoid hemorrhage, 5.5 cc left temporal lobe   Repeat CT of the brain 06/13/18 -mild increase in the size of the left temporal lobe intraparenchymal hematoma, no hydrocephalus, no mass  Effect or midline shift  MRI of the brain -Stable Left temporal lobe hemorrhage with intraventricular extension Surrounding edema with mild regional mass effect, small volume but more widespread bilateral subarachnoid  MRA of the brain:  no large vessel occlusion or stenosis identified.  Carotid Doppler pending  2D Echocardiogram pending  CXR n/a  LDL  pending  HgbA1c pending  VTE SCD  Antiplatelets: None at this time secondary to traumatic or hypertensive intracranial hemorrhage  Atorvastatin: We will resume in the next several days  continue Rehab with PT consult, OT consult, Speech consult  Therapy recommendations: pending- pt currently on bedrest status  Disposition:  pending  **Unclear if patient had an ischemic stroke with hemorrhagic conversion which  possibly led to a traumatic fall.   Continue close neuro and monitoring in the ICU    Hypertension  BP currently 130s, patient was previously on Cleviprex drip overnight  BP goal over night 140-160. Ok to resume standard post stroke BP goal < 220/110  Home medications: Amlodipine 10 mg, clonidine 0.1 in am and 0.2mg  in pm, hold lisinopril and HCTZ in the setting of AKI on CKD.  BP goal normotensive  Resume home blood pressure medications   Hyperlipidemia  Home meds:   Crestor 10 mg  LDL  pending goal < 70  Continue Crestor 10mg  while in hospital-patient with reported allergy to simvastatin and atorvastatin  Continue statin at discharge  CKD stage III  Creatinine 2.07, baseline 1.15  Hold all nephrotoxic medications including metformin, lisinopril for now  Renal function closely  CAD status post previous CABG in March 2013  Aspirin held in the setting of intracranial hemorrhage  Continue other cardioprotective medications Crestor  Type 2 diabetes mellitus  Hold metformin while in hospital  Start Accu-Chek before meals and at bedtime with sliding scale  Hemoglobin A1c pending  Continue carb modified diet  Baseline Dementia  Continue Aricept  Anxiety depression  Continue sertraline and Xanax  Leukocytosis  WBC 12, continue to monitor patient has been afebrile  Moderate LEFT scalp hematoma.  Continue  wound management   Other Stroke Risk Factors  Advanced age  Smokeless Tobacco  Hx of CAD  Family hx stroke (Mark Montoya)  Hospital day # 1  SIGNED Dalvin Clipper [facility] Neuro-hospitalist Team [phone] 06/13/2018, 1:00 PM   06/13/2018 ATTENDING ASSESSMENT   I have personally examined this patient, reviewed notes, independently viewed imaging studies, participated in medical decision making and plan of care.ROS completed by me personally and pertinent positives fully documented  I have made any additions or clarifications directly to the above  note. Agree with note above. He presented with a fall unclear whether it was primary or secondary to intracerebral hemorrhage with etiology remains indeterminate but  perhaps related to uncontrolled hypertension. Recommend close neurological monitoring and strict blood pressure control. Mobilize out of bed. Therapy consults. No family available at the bedside for discussion.This patient is critically ill and at significant risk of neurological worsening, death and care requires constant monitoring of vital signs, hemodynamics,respiratory and cardiac monitoring, extensive review of multiple databases, frequent neurological assessment, discussion with family, other specialists and medical decision making of high complexity.I have made any additions or clarifications directly to the above note.This critical care time does not reflect procedure time, or teaching time or supervisory time of PA/NP/Med Resident etc but could involve care discussion time.  I spent 35 minutes of neurocritical care time  in the care of  this patient.      Delia HeadyPramod Sethi, MD Medical Director [facility] Pager: [phone] 06/13/2018 2:16 PM     To contact Stroke Continuity provider, please refer to [email or web address]. After hours, contact General Neurology

## 2018-06-13 NOTE — ED Notes (Signed)
Report given to [facility]. 

## 2018-06-13 NOTE — Progress Notes (Signed)
OT Cancellation Note  Patient Details Name: Mark Montoya MRN: [id number] DOB: [birth year]   Cancelled Treatment:    Reason Eval/Treat Not Completed: Active bedrest order;Patient at procedure or test/ unavailable(MRI). OT will continue to follow as activity orders are updated.   [name] 06/13/2018, 11:23 AM  [name] OTR/L [phone]

## 2018-06-13 NOTE — H&P (Signed)
Neurology H&P  CC: Confusion  History is obtained from:patient  HPI: Mark Montoya is a 77 y.o. male with a history of stroke who presents with ICH. He went to [facility] ER due to head injury where he was found to be quite hypertensive. A head CT was performed and deomnstrates multiple areas of ICH including a significant hematoma in the left temporal lobe.   He is a poor historian and somewhat aphasic, making history unrelaible  LKW: unclear, 7/25  tpa given?: no, ICH ICH Score: 0 Modified Rankin Scale: 0-Completely asymptomatic and back to baseline post- stroke  ROS:  Unable to obtain due to altered mental status.   Past Medical History:  Diagnosis Date  . Abnormal echocardiogram    NUCLEAR STRESS TEST, 01/28/2007 - EKG negative for ischemia, no significant ischemia demonstrated  . Coronary artery disease 02/03/02   status post CABG by Dr. Andrey Montoya. LIMA-LAD, seq SVG-D1-D2, SVG-OM, SVG-PDA.  Marland Kitchen Hyperlipidemia   . Hypertension   . Stroke (HCC)   . Structural heart disease    2D ECHO, 01/08/2005 - normal-mild  . Syncope    CAROTID DOPPLER, 01/08/2005 - No significant carotid vadcular disease  . Type 2 diabetes mellitus (HCC)      Family History  Problem Relation Age of Onset  . Heart disease Mother   . Heart failure Mother   . Heart disease Father   . CVA Father   . Cancer Brother   . Cancer Brother   . Cancer Brother   . Hypertension Daughter   . Heart disease Daughter   . Diabetes Daughter   . Multiple sclerosis Daughter   . Lupus Daughter   . Heart disease Brother        CABG  . Heart disease Brother        CABG  . Heart disease Brother        CABG     Social History:  reports that he has never smoked. His smokeless tobacco use includes chew. He reports that he does not drink alcohol or use drugs.   Exam: Current vital signs: BP (!) 141/68   Pulse 89   Temp 98.6 F (37 C) (Oral)   Resp 18   SpO2 96%  Vital signs in last 24 hours: Temp:   [98.6 F (37 C)] 98.6 F (37 C) (07/25 1744) Pulse Rate:  [62-105] 89 (07/25 2345) Resp:  [10-23] 18 (07/25 2345) BP: (136-194)/(68-111) 141/68 (07/25 2345) SpO2:  [94 %-100 %] 96 % (07/25 2345)  Physical Exam  Constitutional: Appears well-developed and well-nourished.  Psych: Affect appropriate to situation Eyes: No scleral injection HENT: Poor dentition Head: Normocephalic.  Cardiovascular: + murmur Respiratory: Effort normal and breath sounds normal to anterior ascultation GI: Soft.  No distension. There is no tenderness.  Skin: WDI  Neuro: Mental Status: Patient is awake, alert, he has a moderate aphasia, and able to name simple objects, but he does follow commands and answer some questions with complete phrases. He makes frequent paraphasic errors Cranial Nerves: II: He has a right upper quadrantanopia Pupils are equal, round, and reactive to light.   III,IV, VI: EOMI without ptosis or diploplia.  V: Facial sensation is symmetric to temperature VII: Facial movement is symmetric.  VIII: hearing is intact to voice X: Uvula elevates symmetrically XI: Shoulder shrug is symmetric. XII: tongue is midline without atrophy or fasciculations.  Motor: Tone is normal. Bulk is normal. 5/5 strength was present in all four extremities.  Sensory:  Sensation is symmetric to light touch and temperature in the arms and legs. 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 It is unclear if  he had a hemorrhage which led to the fall and subsequent head injury with subarachnoid versus fall with immediate posttraumatic hemorrhage. Given that this is unclear, I would favor treating as a hypertensive hemorrhage with aggressive blood pressure management and he has been started on) for stroke.  Recommendations: 1) Admit to ICU 2) no antiplatelets or anticoagulants 3) blood pressure control with goal systolic 120 - 140 4) Frequent neuro checks 5) If symptoms worsen or there is decreased mental status, repeat stat head CT 6) PT,OT,ST 7) restart home antihypertensives 8) continue on Flomax 9) SSI for DM 10) continue home Aricept, Zoloft 11) I will hold xanax for tonight, but could consider restarting this if he takes it regularly     This patient is critically ill and at significant risk of neurological worsening, death and care requires constant monitoring of vital signs, hemodynamics,respiratory and cardiac monitoring, neurological assessment, discussion with family, other specialists and medical decision making of high complexity. I spent 60 minutes of neurocritical care time  in the care of  this patient.  Mark SlotMcNeill [name], MD [facility] [phone]  If 7pm- 7am, please page neurology on call as listed in AMION. 06/13/2018  1:30 AM

## 2018-06-14 ENCOUNTER — Inpatient Hospital Stay (HOSPITAL_COMMUNITY): Payer: Medicare Other

## 2018-06-14 DIAGNOSIS — I5030 Unspecified diastolic (congestive) heart failure: Secondary | ICD-10-CM

## 2018-06-14 LAB — LIPID PANEL
CHOLESTEROL: 125 mg/dL (ref 0–200)
HDL: 38 mg/dL — ABNORMAL LOW (ref >40)
LDL CALC: 35 mg/dL (ref 0–99)
TRIGLYCERIDES: 260 mg/dL — AB (ref <150)
Total CHOL/HDL Ratio: 3.3 RATIO
VLDL: 52 mg/dL — ABNORMAL HIGH (ref 0–40)

## 2018-06-14 LAB — GLUCOSE, CAPILLARY
GLUCOSE-CAPILLARY: 178 mg/dL — AB (ref 70–99)
Glucose-Capillary: 270 mg/dL — ABNORMAL HIGH (ref 70–99)
Glucose-Capillary: 114 mg/dL — ABNORMAL HIGH (ref 70–99)

## 2018-06-14 LAB — BASIC METABOLIC PANEL
Anion gap: 15 (ref 5–15)
BUN: 20 mg/dL (ref 8–23)
CO2: 25 mmol/L (ref 22–32)
Calcium: 9.3 mg/dL (ref 8.9–10.3)
Chloride: 101 mmol/L (ref 98–111)
Creatinine, Ser: 1.98 mg/dL — ABNORMAL HIGH (ref 0.61–1.24)
GFR calc Af Amer: 35 mL/min — ABNORMAL LOW (ref >60)
GFR calc non Af Amer: 31 mL/min — ABNORMAL LOW (ref >60)
Glucose, Bld: 145 mg/dL — ABNORMAL HIGH (ref 70–99)
Potassium: 3.4 mmol/L — ABNORMAL LOW (ref 3.5–5.1)
Sodium: 141 mmol/L (ref 135–145)

## 2018-06-14 LAB — TRIGLYCERIDES: Triglycerides: 251 mg/dL — ABNORMAL HIGH (ref <150)

## 2018-06-14 LAB — ECHOCARDIOGRAM COMPLETE

## 2018-06-14 LAB — HEMOGLOBIN A1C
HEMOGLOBIN A1C: 7 % — AB (ref 4.8–5.6)
MEAN PLASMA GLUCOSE: 154.2 mg/dL

## 2018-06-14 MED ORDER — DIPHENHYDRAMINE HCL 25 MG PO CAPS: 25.0000 mg | ORAL_CAPSULE | Freq: Every evening | ORAL | Status: DC | PRN

## 2018-06-14 MED ORDER — LABETALOL HCL 5 MG/ML IV SOLN
10.0000 mg | INTRAVENOUS | Status: AC | PRN
  Administered 2018-06-14 (×3): 10 mg via INTRAVENOUS
  Filled 2018-06-14 (×2): qty 4

## 2018-06-14 MED ORDER — LABETALOL HCL 5 MG/ML IV SOLN
10.0000 mg | INTRAVENOUS | Status: DC | PRN
  Administered 2018-06-14 – 2018-06-15 (×4): 10 mg via INTRAVENOUS
  Filled 2018-06-14 (×3): qty 4

## 2018-06-14 MED ORDER — ALPRAZOLAM 0.25 MG PO TABS: 0.1250 mg | ORAL_TABLET | Freq: Every morning | ORAL | Status: DC

## 2018-06-14 MED ORDER — ALPRAZOLAM 0.25 MG PO TABS
0.2500 mg | ORAL_TABLET | Freq: Every day | ORAL | Status: DC
  Administered 2018-06-14: 0.25 mg via ORAL
  Filled 2018-06-14: qty 1
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
## 2018-06-14 NOTE — Progress Notes (Signed)
Notified md about uncontrolled BP, awaiting orders

## 2018-06-14 NOTE — Progress Notes (Signed)
PT Cancellation Note  Patient Details Name: Mark Montoya MRN: [id number] DOB: 08-Oct-1941   Cancelled Treatment:    Reason Eval/Treat Not Completed: Active bedrest order   [name] 06/14/2018, 6:17 AM

## 2018-06-14 NOTE — Progress Notes (Signed)
  Echocardiogram 2D Echocardiogram has been performed.  Ralpheal Zappone G Natashia Roseman 06/14/2018, 2:27 PM

## 2018-06-14 NOTE — Progress Notes (Signed)
STROKE TEAM - DAILY PROGRESS NOTE    HISTORY Mark Montoya is a 77 y.o. male with a history of stroke who presents with ICH. He went to [facility] ER due to head injury where he was found to be hypertensive. A head CT was performed and demonstrates multiple areas of ICH including a significant hematoma in the left temporal lobe.  Per reports patient states that he fell at home and drove to his relatives house.  They noted that he had a large amount and was bleeding from his head, was mildly confused and family brought him to the ED for evaluation.  Reports patient has some dementia, but does live grandson and is functional and continues to drive.  SUBJECTIVE  No headaches.  BP was a bit difficult to control on maximum Cleviprex so prn Labetalol was added by me recently.         OBJECTIVE Most recent Vital Signs: Vitals:   06/14/18 0745 06/14/18 0800 06/14/18 0815 06/14/18 0830  BP: (!) 158/76 (!) 163/100    Pulse: 79 80 80 83  Resp: (!) 23 (!) 22 (!) 21 (!) 28  Temp:      TempSrc:      SpO2: 97% 92% 93% 96%   CBG (last 3)  Recent Labs    06/13/18 1255 06/13/18 1827 06/13/18 2112  GLUCAP 78 135* 71    Physical Exam  HEENT-left occipital scalp hematoma with oozing of blood.  Normal external eye and conjunctiva.  Very poor dentition Cardiovascular- S1-S2 audible, pulses palpable throughout   Lungs-no rhonchi or wheezing noted, no excessive working breathing.  Saturations within normal limits Abdomen- All 4 quadrants palpated and nontender Musculoskeletal-no joint tenderness, deformity or swelling patient does move extremities Skin-warm and dry  Neuro:  Mental Status: Patient is awake and alert, disoriented, patient with inattention, poor naming, repetition, recall.  Patient consistently follows 1 step and some two-step commands.alert, oriented, thought content appropriate.   Cranial Nerves: II: Blinks to threat on the  leftt but not on the right tIII,IV, VI: ptosis not present, extra-ocular motions intact bilaterally pupils equal, round, reactive to light . Fundi not visualized V,VII: smile symmetric, facial light touch sensation normal bilaterally VIII: hearing intact to voice IX,X: uvula rises symmetrically XI: bilateral shoulder shrug XII: midline tongue extension Motor: She does move all extremities equally Right : Upper extremity   5/5    Left:     Upper extremity   5/5  Lower extremity   5/5     Lower extremity   5/5 Tone and bulk:normal tone throughout; no atrophy noted Sensory: Patient does withdraw to stimuli and is in arms and legs  Deep Tendon Reflexes: 2+ and symmetric throughout Plantars: Right: downgoing   Left: downgoing Cerebellar: Minimally able to follow commands father cerebellar testing, but not ataxic with  finger-to-nose,  Gait: Not assessed  IV Fluid Intake:   . clevidipine 21 mg/hr (06/14/18 0947)    MEDICATIONS  .  stroke: mapping our early stages of recovery book   Does not apply Once  . ALPRAZolam  0.5 mg Oral BID  . amLODipine  10 mg Oral Daily  . cloNIDine  0.3 mg Oral  BID  . diphenhydrAMINE  50 mg Oral QHS  . donepezil  10 mg Oral QHS  . ferrous sulfate  325 mg Oral Q breakfast  . insulin aspart  0-15 Units Subcutaneous TID WC  . pantoprazole  40 mg Oral Daily  . rosuvastatin  10 mg Oral Daily  . senna-docusate  1 tablet Oral BID  . sertraline  100 mg Oral Daily  . tamsulosin  0.8 mg Oral QPC breakfast   PRN:  acetaminophen **OR** acetaminophen (TYLENOL) oral liquid 160 mg/5 mL **OR** acetaminophen, labetalol  Diet:   Diet Order           Diet Carb Modified Fluid consistency: Thin; Room service appropriate? Yes  Diet effective now           CLINICALLY SIGNIFICANT STUDIES Basic Metabolic Panel:  Recent Labs  Lab 06/12/18 1955  NA 142  K 4.0  CL 104  CO2 28  GLUCOSE 153*  BUN 24*  CREATININE 2.07*  CALCIUM 9.5   Liver Function Tests: No  results for input(s): AST, ALT, ALKPHOS, BILITOT, PROT, ALBUMIN in the last 168 hours. CBC:  Recent Labs  Lab 06/12/18 1955 06/13/18 0307  WBC 9.2 12.0*  NEUTROABS 6.7  --   HGB 12.4* 12.4*  HCT 37.9* 38.0*  MCV 92.4 92.0  PLT 167 175   Coagulation:  Recent Labs  Lab 06/13/18 0307  LABPROT 13.5  INR 1.04    Ct Head Wo Contrast 06/13/2018 IMPRESSION:  1. Slight increase in size of left temporal lobe intraparenchymal hematoma without midline shift or new mass effect. 2. Increased intraventricular blood in the left temporal horn without hydrocephalus. 3. Unchanged small volume subarachnoid hemorrhage along the right sylvian fissure.   Ct Head Wo Contrast 06/12/2018 IMPRESSION:  1. 5.5 cc LEFT temporal lobe hematoma likely posttraumatic given head injury though hypertensive etiology possible. Small volume LEFT lateral intraventricular hemorrhage. Small volume subarachnoid hemorrhage. 2. Moderate LEFT scalp hematoma.  No skull fracture. 3. Multiple old small infarcts. Mild chronic small vessel ischemic changes.   EKG  SR with 1st degree AV block   MRI Brain/MRA Head 06/13/2018 IMPRESSION: 1. Left temporal lobe hemorrhage with intraventricular extension appears stable since the CT earlier today. Intra-axial blood products estimated at 17 mL. Surrounding edema. Mild regional mass effect. 2. Small volume but more widespread bilateral subarachnoid hemorrhage than was evident by CT. Small volume intraventricular hemorrhage with no ventriculomegaly. 3. No acute infarct or new intracranial abnormality. 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 4. Motion degraded intracranial MRA with no large vessel occlusion or stenosis identified.     Echocardiogram:                                                    PENDING    B/L Carotid U/S:                                                    06/13/2018 Final Interpretation: Right Carotid:  Velocities in the right ICA are consistent with a 1-39% stenosis. Left Carotid: Velocities in the left ICA are consistent with a 1-39% stenosis. Vertebrals: Bilateral vertebral arteries demonstrate antegrade flow.  ASSESSMENT/PLAN Stroke:   Intracranial hemorrhage with left lateral intraventricular hemorrhage, small volume subarachnoid hemorrhage, 5.5 cc left temporal lobe hematoma etiology indeterminate but likely related to  uncontrolled hypertension.Imaging does not show any other evidence to suggest amyloid angiopathy  Patient also status post trauma post a fall with moderate left scalp hematoma secondary to trauma but location of primary brain hematoma is not typical for a posttraumatic hemorrhage and it's more likely that his head trauma is secondary to his intracerebral hemorrhage  Code Stroke: No  CTA head & neck to be performed secondary to renal function  CT of the brain left lateral intraventricular hemorrhage, small volume subarachnoid hemorrhage, 5.5 cc left temporal lobe   Repeat CT of the brain 06/13/18 -mild increase in the size of the left temporal lobe intraparenchymal hematoma, no hydrocephalus, no mass  Effect or midline shift  MRI of the brain -Stable Left temporal lobe hemorrhage with intraventricular extension Surrounding edema with mild regional mass effect, small volume but more widespread bilateral subarachnoid  MRA of the brain:  no large vessel occlusion or stenosis identified.  Carotid Doppler - unremarkable  2D Echocardiogram pending  CXR n/a  LDL - 35  HgbA1c - 7.0  VTE SCD  Antiplatelets: None at this time secondary to traumatic or hypertensive intracranial hemorrhage  Atorvastatin: We will resume in the next several days  continue Rehab with PT consult, OT consult, Speech consult  Therapy recommendations: pending- pt currently on bedrest status  Disposition:  pending  Unclear if patient had an ischemic stroke with hemorrhagic conversion  which possibly led to a traumatic fall.   Continue close neuro and monitoring in the ICU    Hypertension  BP currently 130s, patient was previously on Cleviprex drip overnight - Add prn Labetalol  BP goal over night 140-160. Ok to resume standard post stroke BP goal < 220/110  Home medications: Amlodipine 10 mg, clonidine 0.1 in am and 0.2mg  in pm, hold lisinopril and HCTZ in the setting of AKI on CKD.  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  Continue  wound management   Other Stroke Risk Factors  Advanced age  Smokeless Tobacco  Hx of CAD  Family hx stroke (Father)  PLAN  D/C bedrest - Up with assistance per Dr Marlis EdelsonSethi's note yesterday.  Monitor labs - temp 99.6 - CXR ; U/A ; Bmet ; CBC  Hospital day # 2    06/14/2018 ATTENDING ASSESSMENT    Probable hypertensive left temporal ICH with secondary fall.  He is on maximum Cleviprex 21 mg/hr, but difficult to maintain BP goal.  Will change goal to SBP <160 and  added Labetalol 10 mg prn.    Will repeat CT Brain  tomorrow to assess if hemorrhage is worsening or not.    Cont Clonidine and Norvasc for now.    Weston Settle, MS, MD         To contact Stroke Continuity provider, please refer to [email or web address]. After hours, contact General Neurology

## 2018-06-15 ENCOUNTER — Inpatient Hospital Stay (HOSPITAL_COMMUNITY): Payer: Medicare Other

## 2018-06-15 LAB — BASIC METABOLIC PANEL
Anion gap: 15 (ref 5–15)
BUN: 25 mg/dL — AB (ref 8–23)
CHLORIDE: 99 mmol/L (ref 98–111)
CO2: 26 mmol/L (ref 22–32)
CREATININE: 1.92 mg/dL — AB (ref 0.61–1.24)
Calcium: 9.2 mg/dL (ref 8.9–10.3)
GFR calc Af Amer: 37 mL/min — ABNORMAL LOW (ref >60)
GFR calc non Af Amer: 32 mL/min — ABNORMAL LOW (ref >60)
Glucose, Bld: 174 mg/dL — ABNORMAL HIGH (ref 70–99)
Potassium: 3.2 mmol/L — ABNORMAL LOW (ref 3.5–5.1)
Sodium: 140 mmol/L (ref 135–145)

## 2018-06-15 LAB — URINALYSIS, COMPLETE (UACMP) WITH MICROSCOPIC
BILIRUBIN URINE: NEGATIVE
Glucose, UA: 50 mg/dL — AB
Hgb urine dipstick: NEGATIVE
Ketones, ur: 5 mg/dL — AB
Leukocytes, UA: NEGATIVE
Nitrite: NEGATIVE
PH: 5 (ref 5.0–8.0)
Protein, ur: NEGATIVE mg/dL
Specific Gravity, Urine: 1.015 (ref 1.005–1.030)

## 2018-06-15 LAB — CBC
HEMATOCRIT: 39 % (ref 39.0–52.0)
HEMOGLOBIN: 12.7 g/dL — AB (ref 13.0–17.0)
MCH: 29.7 pg (ref 26.0–34.0)
MCHC: 32.6 g/dL (ref 30.0–36.0)
MCV: 91.1 fL (ref 78.0–100.0)
Platelets: 156 10*3/uL (ref 150–400)
RBC: 4.28 MIL/uL (ref 4.22–5.81)
RDW: 13.4 % (ref 11.5–15.5)
WBC: 13.9 10*3/uL — ABNORMAL HIGH (ref 4.0–10.5)

## 2018-06-15 LAB — GLUCOSE, CAPILLARY
GLUCOSE-CAPILLARY: 170 mg/dL — AB (ref 70–99)
GLUCOSE-CAPILLARY: 243 mg/dL — AB (ref 70–99)
Glucose-Capillary: 172 mg/dL — ABNORMAL HIGH (ref 70–99)
Glucose-Capillary: 213 mg/dL — ABNORMAL HIGH (ref 70–99)

## 2018-06-15 LAB — MAGNESIUM: Magnesium: 2 mg/dL (ref 1.7–2.4)

## 2018-06-15 MED ORDER — ALPRAZOLAM 0.25 MG PO TABS
0.1250 mg | ORAL_TABLET | Freq: Every day | ORAL | Status: DC
  Administered 2018-06-15 – 2018-06-17 (×3): 0.125 mg via ORAL
  Filled 2018-06-15 (×3): qty 1

## 2018-06-15 MED ORDER — LABETALOL HCL 5 MG/ML IV SOLN: 10.0000 mg | INTRAVENOUS | Status: DC | PRN

## 2018-06-15 MED ORDER — SODIUM CHLORIDE 0.9 % IV SOLN
INTRAVENOUS | Status: DC
  Administered 2018-06-15 – 2018-06-16 (×2): via INTRAVENOUS

## 2018-06-15 MED ORDER — POTASSIUM CHLORIDE 10 MEQ/100ML IV SOLN
10.0000 meq | INTRAVENOUS | Status: AC
  Administered 2018-06-15 (×3): 10 meq via INTRAVENOUS
  Filled 2018-06-15 (×2): qty 100

## 2018-06-15 MED ORDER — LABETALOL HCL 5 MG/ML IV SOLN
10.0000 mg | INTRAVENOUS | Status: DC | PRN
  Administered 2018-06-15 – 2018-06-17 (×7): 10 mg via INTRAVENOUS
  Filled 2018-06-15 (×4): qty 4

## 2018-06-15 MED ORDER — RESOURCE THICKENUP CLEAR PO POWD
ORAL | Status: DC | PRN
  Filled 2018-06-15: qty 125

## 2018-06-15 MED ORDER — ORAL CARE MOUTH RINSE
15.0000 mL | Freq: Two times a day (BID) | OROMUCOSAL | Status: DC
  Administered 2018-06-15 – 2018-06-18 (×6): 15 mL via OROMUCOSAL

## 2018-06-15 MED ORDER — POTASSIUM CHLORIDE 10 MEQ/100ML IV SOLN
INTRAVENOUS | Status: AC
  Filled 2018-06-15: qty 100

## 2018-06-15 MED ORDER — CHLORHEXIDINE GLUCONATE 0.12 % MT SOLN
15.0000 mL | Freq: Two times a day (BID) | OROMUCOSAL | Status: DC
  Administered 2018-06-15 – 2018-06-18 (×7): 15 mL via OROMUCOSAL
  Filled 2018-06-15 (×5): qty 15

## 2018-06-15 NOTE — Evaluation (Signed)
Physical Therapy Evaluation Patient Details Name: Mark Montoya MRN: [id number] DOB: Sep 18, 1941 Today's Date: 06/15/2018   History of Present Illness  77 y.o. male with a history of stroke who presents with ICH. He went to [facility] due to head injury where he was found to be hypertensive. A head CT was performed and demonstrates multiple areas of ICH including a significant hematoma in the left temporal lobe.   Clinical Impression  Orders received for PT evaluation. Patient demonstrates deficits in functional mobility as indicated below. Will benefit from continued skilled PT to address deficits and maximize function. Will see as indicated and progress as tolerated. 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 Patient now with cognitive deficits and mobility impairments requiring increased physical assist. Feel patient will need comprehensive therapies upon acute discharge. Recommend CIR consult at this time.      Follow Up Recommendations CIR    Equipment Recommendations       Recommendations for Other Services Rehab consult     Precautions / Restrictions Precautions Precautions: Fall      Mobility  Bed Mobility Overal bed mobility: Needs Assistance Bed Mobility: Supine to Sit     Supine to sit: Min assist     General bed mobility comments: min assist to come to EOB, increased time and effort to perform. Cues for initiation of LE movement to EOB  Transfers Overall transfer level: Needs assistance Equipment used: 2 person hand held assist Transfers: Sit to/from Stand Sit to Stand: +2 physical assistance;Mod assist         General transfer comment: Moderate assist to power up to standing with bilateral UE support. Some posterior lean upon initial elevation to upright  Ambulation/Gait Ambulation/Gait assistance: Mod assist;+2 physical assistance Gait Distance (Feet): 24 Feet Assistive device: 2 person hand held  assist Gait Pattern/deviations: Step-to pattern;Decreased stride length;Shuffle;Scissoring;Drifts right/left;Trunk flexed Gait velocity: decreased Gait velocity interpretation: <1.31 ft/sec, indicative of household ambulator General Gait Details: patient requiring increased assist to maintain upright, manual facilitation of pacing and VCs for increased stride. Flexed posture noted with decreased coordination at this time.  Stairs            Wheelchair Mobility    Modified Rankin (Stroke Patients Only) Modified Rankin (Stroke Patients Only) Pre-Morbid Rankin Score: No symptoms Modified Rankin: Moderately severe disability     Balance Overall balance assessment: Needs assistance Sitting-balance support: Feet supported Sitting balance-Leahy Scale: Fair Sitting balance - Comments: fair with static sitting, poor with dynamic sitting right lateral list noted Postural control: Right lateral lean Standing balance support: Bilateral upper extremity supported Standing balance-Leahy Scale: Poor Standing balance comment: Flexed posture, narrow BOS and poor ability to come to upright, reliance on Bilateral UE support.                             Pertinent Vitals/Pain Pain Assessment: Faces Faces Pain Scale: Hurts little more Pain Location: unclean Pain Descriptors / Indicators: Grimacing Pain Intervention(s): Monitored during session    Home Living Family/patient expects to be discharged to:: Private residence(unless otherwise recomended) Living Arrangements: Other relatives   Type of Home: House Home Access: Stairs to enter Entrance Stairs-Rails: None Entrance Stairs-Number of Steps: 2 Home Layout: One level        Prior Function Level of Independence: Independent               Hand Dominance  Extremity/Trunk Assessment        Lower Extremity Assessment Lower Extremity Assessment: Generalized weakness       Communication    Communication: HOH  Cognition Arousal/Alertness: Awake/alert Behavior During Therapy: WFL for tasks assessed/performed Overall Cognitive Status: Impaired/Different from baseline Area of Impairment: Orientation;Attention;Memory;Following commands;Safety/judgement;Awareness;Problem solving                 Orientation Level: Disoriented to;Place;Time;Situation Current Attention Level: Sustained Memory: Decreased recall of precautions;Decreased short-term memory Following Commands: Follows one step commands with increased time;Follows multi-step commands inconsistently Safety/Judgement: Decreased awareness of safety;Decreased awareness of deficits Awareness: Intellectual Problem Solving: Difficulty sequencing;Slow processing;Requires verbal cues;Requires tactile cues        General Comments      Exercises     Assessment/Plan    PT Assessment Patient needs continued PT services  PT Problem List Decreased strength;Decreased activity tolerance;Decreased balance;Decreased mobility;Decreased coordination;Decreased cognition;Decreased safety awareness;Pain       PT Treatment Interventions DME instruction;Gait training;Stair training;Therapeutic activities;Functional mobility training;Therapeutic exercise;Balance training;Neuromuscular re-education;Cognitive remediation;Patient/family education    PT Goals (Current goals can be found in the Care Plan section)  Acute Rehab PT Goals Patient Stated Goal: to get better PT Goal Formulation: With patient/family Time For Goal Achievement: 06/29/18 Potential to Achieve Goals: Good    Frequency Min 4X/week   Barriers to discharge        Co-evaluation PT/OT/SLP Co-Evaluation/Treatment: Yes Reason for Co-Treatment: Complexity of the patient's impairments (multi-system involvement);Necessary to address cognition/behavior during functional activity;For patient/therapist safety;To address functional/ADL transfers PT goals addressed  during session: Mobility/safety with mobility;Balance OT goals addressed during session: ADL's and self-care       AM-PAC PT "6 Clicks" Daily Activity  Outcome Measure Difficulty turning over in bed (including adjusting bedclothes, sheets and blankets)?: A Little Difficulty moving from lying on back to sitting on the side of the bed? : Unable Difficulty sitting down on and standing up from a chair with arms (e.g., wheelchair, bedside commode, etc,.)?: Unable Help needed moving to and from a bed to chair (including a wheelchair)?: A Lot Help needed walking in hospital room?: A Lot Help needed climbing 3-5 steps with a railing? : Total 6 Click Score: 10    End of Session Equipment Utilized During Treatment: Gait belt Activity Tolerance: Patient tolerated treatment well Patient left: in chair;with call bell/phone within reach;with chair alarm set;with family/visitor present Nurse Communication: Mobility status PT Visit Diagnosis: Unsteadiness on feet (R26.81);History of falling (Z91.81);Muscle weakness (generalized) (M62.81);Other symptoms and signs involving the nervous system (R29.898)    Time: 1610-9604 PT Time Calculation (min) (ACUTE ONLY): 25 min   Charges:   PT Evaluation $PT Eval Moderate Complexity: 1 Mod          Charlotte Crumb, PT DPT  Board Certified Neurologic Specialist [phone]   Fabio Asa 06/15/2018, 2:51 PM

## 2018-06-15 NOTE — Progress Notes (Signed)
STROKE TEAM - DAILY PROGRESS NOTE    HISTORY Mark Montoya is a 77 y.o. male with a history of stroke who presents with ICH. He went to [facility] ER due to head injury where he was found to be hypertensive. A head CT was performed and demonstrates multiple areas of ICH including a significant hematoma in the left temporal lobe.  Per reports patient states that he fell at home and drove to his relatives house.  They noted that he had a large amount and was bleeding from his head, was mildly confused and family brought him to the ED for evaluation.  Reports patient has some dementia, but does live grandson and is functional and continues to drive.  SUBJECTIVE  No headaches.  BP is much better controlled.  He is on very low dose Cleviprex and has not needed Labetalol today.  MBSS scheduled later today.  CT today- slightly larger left temporal hemorrhage size; stable IVH; some new areas left SAH.    Coags normal.  Plts 156K.  WBC 14, but no source of infection found yet, CXR negative. U/A pending.  BCx NGTD.  GFR 37       OBJECTIVE Most recent Vital Signs: Vitals:   06/15/18 0615 06/15/18 0630 06/15/18 0645 06/15/18 0700  BP: 129/85 (!) 169/69 (!) 154/74 (!) 149/89  Pulse: 81 69 72 74  Resp: 20 18 17 19   Temp:      TempSrc:      SpO2: 94% 95% 96% 96%   CBG (last 3)  Recent Labs    06/14/18 1527 06/14/18 2139 06/15/18 0727  GLUCAP 114* 178* 172*    Physical Exam  HEENT-left occipital scalp hematoma with oozing of blood.  Normal external eye and conjunctiva.  Very poor dentition Cardiovascular- S1-S2 audible, pulses palpable throughout   Lungs-no rhonchi or wheezing noted, no excessive working breathing.  Saturations within normal limits Abdomen- All 4 quadrants palpated and nontender Musculoskeletal-no joint tenderness, deformity or swelling patient does move extremities Skin-warm and dry  Neuro:  Mental  Status: Patient is awake and alert, disoriented, patient with poor naming, repetition, recall.  Patient  follows some commands, but not most. Cranial Nerves: intact. IIMotor: She does move all extremities equally Right : Upper extremity   5/5    Left:     Upper extremity   5/5  Lower extremity   5/5     Lower extremity   5/5 Tone and bulk:normal tone throughout; no atrophy noted Sensory: Patient does withdraw to stimuli and is in arms and legs  Deep Tendon Reflexes: 2+ and symmetric throughout Plantars: Right: downgoing   Left: downgoing Cerebellar: Minimally able to follow commands father cerebellar testing, but not ataxic with  finger-to-nose,  Gait: Not assessed  IV Fluid Intake:   . clevidipine 6 mg/hr (06/15/18 0744)    MEDICATIONS  .  stroke: mapping our early stages of recovery book   Does not apply Once  . ALPRAZolam  0.125 mg Oral q morning - 10a  . ALPRAZolam  0.25 mg Oral QHS  . amLODipine  10 mg Oral Daily  . cloNIDine  0.3 mg Oral BID  . donepezil  10 mg Oral  QHS  . ferrous sulfate  325 mg Oral Q breakfast  . insulin aspart  0-15 Units Subcutaneous TID WC  . pantoprazole  40 mg Oral Daily  . rosuvastatin  10 mg Oral Daily  . senna-docusate  1 tablet Oral BID  . sertraline  100 mg Oral Daily  . tamsulosin  0.8 mg Oral QPC breakfast   PRN:  acetaminophen **OR** acetaminophen (TYLENOL) oral liquid 160 mg/5 mL **OR** acetaminophen, diphenhydrAMINE, labetalol  Diet:   Diet Order           Diet NPO time specified  Diet effective now           CLINICALLY SIGNIFICANT STUDIES Basic Metabolic Panel:  Recent Labs  Lab 06/14/18 0216 06/15/18 0151  NA 141 140  K 3.4* 3.2*  CL 101 99  CO2 25 26  GLUCOSE 145* 174*  BUN 20 25*  CREATININE 1.98* 1.92*  CALCIUM 9.3 9.2   Liver Function Tests: No results for input(s): AST, ALT, ALKPHOS, BILITOT, PROT, ALBUMIN in the last 168 hours. CBC:  Recent Labs  Lab 06/12/18 1955 06/13/18 0307 06/15/18 0151  WBC 9.2  12.0* 13.9*  NEUTROABS 6.7  --   --   HGB 12.4* 12.4* 12.7*  HCT 37.9* 38.0* 39.0  MCV 92.4 92.0 91.1  PLT 167 175 156   Coagulation:  Recent Labs  Lab 06/13/18 0307  LABPROT 13.5  INR 1.04    Ct Head Wo Contrast  06/15/2018 IMPRESSION:  1. Increased size of left intraventricular hemorrhage, now measuring 6.7 x 2.8 x 3.0 cm without hydrocephalus. 2. Increase in diffuse subarachnoid hemorrhage noted predominantly in the posterior temporal and parietal lobes, the right occipital lobe, and along the medial frontal lobes bilaterally. 3. No significant change in mass effect. 4. Stable atrophy and white matter disease. 5. Stable remote lacunar infarcts involving the basal ganglia.   Ct Head Wo Contrast 06/13/2018 IMPRESSION:  1. Slight increase in size of left temporal lobe intraparenchymal hematoma without midline shift or new mass effect. 2. Increased intraventricular blood in the left temporal horn without hydrocephalus. 3. Unchanged small volume subarachnoid hemorrhage along the right sylvian fissure.   Ct Head Wo Contrast 06/12/2018 IMPRESSION:  1. 5.5 cc LEFT temporal lobe hematoma likely posttraumatic given head injury though hypertensive etiology possible. Small volume LEFT lateral intraventricular hemorrhage. Small volume subarachnoid hemorrhage. 2. Moderate LEFT scalp hematoma.  No skull fracture. 3. Multiple old small infarcts. Mild chronic small vessel ischemic changes.   EKG  SR with 1st degree AV block   MRI Brain/MRA Head 06/13/2018 IMPRESSION: 1. Left temporal lobe hemorrhage with intraventricular extension appears stable since the CT earlier today. Intra-axial blood products estimated at 17 mL. Surrounding edema. Mild regional mass effect. 2. Small volume but more widespread bilateral subarachnoid hemorrhage than was evident by CT. Small volume intraventricular hemorrhage with no ventriculomegaly. 3. No acute infarct or new intracranial abnormality.  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 4. Motion degraded intracranial MRA with no large vessel occlusion or stenosis identified.  DG Chest Portable 1 View 06/14/2018 IMPRESSION: Low lung volumes with linear opacity at the left lung base potentially atelectasis or new consolidation. Surgical changes of median sternotomy and CABG.   DG Chest Portable 1 View 06/15/2018 IMPRESSION: No acute findings. Stable subsegmental atelectasis at the left lung base.      Echocardiogram:  06/14/2018 Study Conclusions - Left ventricle: The cavity size was normal. Systolic function was   vigorous. The estimated ejection fraction was in the range of 65%   to 70%. Wall motion was normal; there were no regional wall   motion abnormalities. There was an increased relative   contribution of atrial contraction to ventricular filling.   Doppler parameters are consistent with abnormal left ventricular   relaxation (grade 1 diastolic dysfunction). - Aortic valve: Trileaflet; moderately thickened, moderately   calcified leaflets. 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 There was mild   regurgitation. Valve area (VTI): 1.51 cm^2. Valve area (Vmax):   1.44 cm^2. Valve area (Vmean): 1.48 cm^2. - Aorta: Aortic root dimension: 37 mm (ED). - Aortic root: The aortic root was mildly dilated. - Mitral valve: There was mild regurgitation. - Left atrium: The atrium was mildly dilated. - Right atrium: The atrium was mildly dilated. - Tricuspid valve: There was mild regurgitation. - Pulmonary arteries: PA peak pressure: 32 mm Hg (S).   B/L Carotid U/S:                                                    06/13/2018 Final Interpretation: Right Carotid: Velocities in the right ICA are consistent with a 1-39% stenosis. Left Carotid: Velocities in the left ICA are consistent with a 1-39% stenosis. Vertebrals: Bilateral vertebral arteries  demonstrate antegrade flow.    ASSESSMENT/PLAN Stroke:   Intracranial hemorrhage with left lateral intraventricular hemorrhage, small volume subarachnoid hemorrhage, 5.5 cc left temporal lobe hematoma etiology indeterminate but likely related to  uncontrolled hypertension.Imaging does not show any other evidence to suggest amyloid angiopathy  Patient also status post trauma post a fall with moderate left scalp hematoma secondary to trauma but location of primary brain hematoma is not typical for a posttraumatic hemorrhage and it's more likely that his head trauma is secondary to his intracerebral hemorrhage  Code Stroke: No  CTA head & neck to be performed secondary to renal function  CT of the brain left lateral intraventricular hemorrhage, small volume subarachnoid hemorrhage, 5.5 cc left temporal lobe   Repeat CT of the brain 06/13/18 -mild increase in the size of the left temporal lobe intraparenchymal hematoma, no hydrocephalus, no mass  Effect or midline shift  MRI of the brain -Stable Left temporal lobe hemorrhage with intraventricular extension Surrounding edema with mild regional mass effect, small volume but more widespread bilateral subarachnoid  MRA of the brain:  no large vessel occlusion or stenosis identified.  U/A - pending  Blood Cultures - pending  CXR - No acute findings. Stable subsegmental atelectasis at the left lung base.  WBCs 12.0 -> 13.9  Carotid Doppler - unremarkable  2D Echocardiogram - EF 65 - 70%. No cardiac source of emboli identified.  LDL - 35  HgbA1c - 7.0  VTE SCD  Antiplatelets: None at this time secondary to traumatic or hypertensive intracranial hemorrhage  Atorvastatin: We will resume in the next several days  continue Rehab with PT consult, OT consult, Speech consult  Therapy recommendations: pending- pt currently on bedrest status  Disposition:  pending  Unclear if patient had an ischemic stroke with hemorrhagic conversion which  possibly led to a traumatic fall.   Continue close neuro and monitoring in the ICU    Hypertension  BP currently 130s, patient was previously on Cleviprex drip  overnight - Add prn Labetalol  BP goal over night 140-160.  Home medications: Amlodipine 10 mg, clonidine 0.1 in am and 0.2mg  in pm, hold lisinopril and HCTZ in the setting of AKI on CKD.  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  Continue  wound management    Other Stroke Risk Factors  Advanced age  Smokeless Tobacco  Hx of CAD  Family hx stroke (Father)  Hypokalemia - supplement  PLAN  D/C bedrest - Up with assistance per Dr Marlis Edelson note yesterday.  Monitor labs - temp 99.1 (Sunday) - CXR - neg ; U/A - pending ; WBCs 12.0 -> 13.9  Hospital day # 3    06/15/2018 ATTENDING ASSESSMENT    Probable hypertensive left temporal ICH with secondary fall.  Although CT is worse, clinically he is better.  Nevertheless, I will change the BP parameters to keep SBP <140.    Temperature has come down, but WBC rising.   No clear source of infection yet.  Awaiting U/A and BCx.  Temp may have been related to the blood collection in the brain, but would not explain the rising WBC.  Keep monitoring.  The Xanax was affecting his mental status to a large degree.  I will stop the morning dose and reduce his evening dose by 50%.    Weston Settle, MS, MD        To contact Stroke Continuity provider, please refer to [email or web address]. After hours, contact General Neurology

## 2018-06-15 NOTE — Progress Notes (Signed)
Modified Barium Swallow Progress Note  Patient Details  Name: Mark Montoya MRN: [id number] Date of Birth: Apr 02, 1941  Today's Date: 06/15/2018  Modified Barium Swallow completed.  Full report located under Chart Review in the Imaging Section.  Brief recommendations include the following:  Clinical Impression  Decreased lingual manipulation, oral holding with prolonged manipulation and delayed transit in addition to frequent lingual residue. Penetration of nectar due to incomplete epiglottic deflection and laryngeal closure remaining above the vocal cords without sensation. Swallow intermittently delayed to the valleculae and mild residue in the vallecula given inadequate inversion of epiglottis. Cognitive impairments prevent trial of compensatory strategies. Esophageal view showed stasis distal portion. Recommend Dys 1, honey thick liquids, crush meds, full supervision/assist and ensure swallow prior to giving next bite/sip.    Swallow Evaluation Recommendations       SLP Diet Recommendations: Dysphagia 1 (Puree) solids;Honey thick liquids   Liquid Administration via: Cup   Medication Administration: Crushed with puree   Supervision: Staff to assist with self feeding;Patient able to self feed   Compensations: Minimize environmental distractions;Slow rate;Small sips/bites   Postural Changes: Seated upright at 90 degrees;Remain semi-upright after after feeds/meals (Comment)   Oral Care Recommendations: Oral care BID   Other Recommendations: Order thickener from pharmacy    Mark Montoya, Mark Montoya 06/15/2018,12:24 PM  Mark CoonsLisa Montoya Lonell FaceLitaker M.Ed [employer]SLP Pager [phone]

## 2018-06-15 NOTE — Evaluation (Signed)
Clinical/Bedside Swallow Evaluation Patient Details  Name: Mark Montoya MRN: [id number] Date of Birth: 24-Mar-1941  Today's Date: 06/15/2018 Time: SLP Start Time (ACUTE ONLY): 0947 SLP Stop Time (ACUTE ONLY): 1001 SLP Time Calculation (min) (ACUTE ONLY): 14 min  Past Medical History:  Past Medical History:  Diagnosis Date  . Abnormal echocardiogram    NUCLEAR STRESS TEST, 01/28/2007 - EKG negative for ischemia, no significant ischemia demonstrated  . Coronary artery disease 02/03/02   status post CABG by Dr. Andrey Spearman. LIMA-LAD, seq SVG-D1-D2, SVG-OM, SVG-PDA.  Marland Kitchen Hyperlipidemia   . Hypertension   . Stroke (HCC)   . Structural heart disease    2D ECHO, 01/08/2005 - normal-mild  . Syncope    CAROTID DOPPLER, 01/08/2005 - No significant carotid vadcular disease  . Type 2 diabetes mellitus (HCC)    Past Surgical History:  Past Surgical History:  Procedure Laterality Date  . CARDIAC CATHETERIZATION  01/22/2002   CABG recommended  . CORONARY ARTERY BYPASS GRAFT  02/03/2002   x5; LIMA-LAD, SVG-D1 and D2, SVG-OM1, and SVG-PDA  . ORIF TIBIA PLATEAU Right 11/02/2013   Procedure: OPEN REDUCTION INTERNAL FIXATION (ORIF) RIGHT TIBIAL PLATEAU;  Surgeon: Budd Palmer, MD;  Location: [facility] OR;  Service: Orthopedics;  Laterality: Right;   HPI:  Patient is a 77 y.o. male with PMH: CVA, HTN, CAD, who presented to ER at [facility] secondary to head injury where he was found to be hypotensive. Head CT revealed multiple areas of ICH including a significant hematoma in left temporal lobe. Repeat CT 7/28 showed Increased size of left intraventricular hemorrhage, now measuring 6.7 x 2.8 x 3.0 cm without hydrocephalus, Increase in diffuse subarachnoid hemorrhage, Stable remote lacunar infarcts involving the basal ganglia. BSE ordered. CXR No acute findings. Stable subsegmental atelectasis at the left lung    Assessment / Plan / Recommendation Clinical Impression  Pt alert with delayed  information processing and following one step simple commands. Oral cavity odorous with decaying lower dentition and upper denture plate. PO trials following oral hygiene marked by oral holding with water and puree. Delayed throat clear and cough at end of study leading to suspicion of airway intrusion. Pt's SDH has increased, history of basal ganglia infarct and clinical findings warrant instrumental assessment with MBS scheduled for 11:00 this morning.   SLP Visit Diagnosis: Dysphagia, unspecified (R13.10)    Aspiration Risk  Moderate aspiration risk    Diet Recommendation NPO        Other  Recommendations Oral Care Recommendations: Oral care QID   Follow up Recommendations Skilled Nursing facility      Frequency and Duration            Prognosis        Swallow Study   General HPI: Patient is a 77 y.o. male with PMH: CVA, HTN, CAD, who presented to ER at [facility] secondary to head injury where he was found to be hypotensive. Head CT revealed multiple areas of ICH including a significant hematoma in left temporal lobe. Repeat CT 7/28 showed Increased size of left intraventricular hemorrhage, now measuring 6.7 x 2.8 x 3.0 cm without hydrocephalus, Increase in diffuse subarachnoid hemorrhage, Stable remote lacunar infarcts involving the basal ganglia. BSE ordered. CXR No acute findings. Stable subsegmental atelectasis at the left lung  Type of Study: Bedside Swallow Evaluation Previous Swallow Assessment: none found Diet Prior to this Study: NPO Temperature Spikes Noted: Yes Respiratory Status: Room air History of Recent Intubation: No Behavior/Cognition: Alert;Cooperative;Pleasant  mood;Requires cueing Oral Cavity Assessment: Other (comment)(odorous, suspect lingual candidias) Oral Care Completed by SLP: Yes Oral Cavity - Dentition: Dentures, top;Poor condition(lower dentition significant decay) Vision: Functional for self-feeding Self-Feeding Abilities: Needs  assist Patient Positioning: Upright in bed Baseline Vocal Quality: Normal Volitional Cough: Weak Volitional Swallow: Able to elicit    Oral/Motor/Sensory Function Overall Oral Motor/Sensory Function: Generalized oral weakness   Ice Chips Ice chips: Not tested   Thin Liquid Thin Liquid: Impaired Presentation: Cup;Straw Oral Phase Impairments: Reduced lingual movement/coordination Oral Phase Functional Implications: Oral holding Pharyngeal  Phase Impairments: Cough - Delayed    Nectar Thick Nectar Thick Liquid: Not tested   Honey Thick Honey Thick Liquid: Not tested   Puree Puree: Impaired Presentation: Spoon Oral Phase Impairments: Reduced lingual movement/coordination Oral Phase Functional Implications: Oral holding Pharyngeal Phase Impairments: (none)   Solid     Solid: Not tested      Royce MacadamiaLitaker, Katalyn Matin Willis 06/15/2018,10:20 AM  Breck CoonsLisa Willis Lonell FaceLitaker M.Ed [employer]-SLP Pager [phone]

## 2018-06-15 NOTE — Evaluation (Signed)
Occupational Therapy Evaluation Patient Details Name: Mark Montoya F Bergh MRN: [id number] DOB: 05-06-1941 Today's Date: 06/15/2018    History of Present Illness 77 y.o. male with a history of stroke who presents with ICH. [facility] went to [facility] due to head injury where he was found to be hypertensive. A head CT was performed and demonstrates multiple areas of ICH including a significant hematoma in the left temporal lobe.    Clinical Impression   Pt reports he was independent with ADL and mobility PTA. Currently pt requires mod assist +2 for functional mobility and min-mod assist overall for ADL. Pt presenting with impaired cognition, poor balance in sitting and standing, generalized weakness impacting his independence and safety with ADL and functional mobility. Recommending CIR level therapies to maximize independence and safety with ADL and functional mobility prior to return home. Pt would benefit from continued skilled OT to address established goals.    Follow Up Recommendations  CIR;Supervision/Assistance - 24 hour    Equipment Recommendations  Other (comment)(TBD at next venue)    Recommendations for Other Services Rehab consult     Precautions / Restrictions Precautions Precautions: Fall Restrictions Weight Bearing Restrictions: No      Mobility Bed Mobility Overal bed mobility: Needs Assistance Bed Mobility: Supine to Sit     Supine to sit: Min assist     General bed mobility comments: min assist to come to EOB, increased time and effort to perform. Cues for initiation of LE movement to EOB  Transfers Overall transfer level: Needs assistance Equipment used: 2 person hand held assist Transfers: Sit to/from Stand Sit to Stand: +2 physical assistance;Mod assist         General transfer comment: Moderate assist to power up to standing with bilateral UE support. Some posterior lean upon initial elevation to upright    Balance Overall balance assessment: Needs  assistance Sitting-balance support: Feet supported Sitting balance-Leahy Scale: Fair Sitting balance - Comments: fair with static sitting, poor with dynamic sitting right lateral list noted Postural control: Right lateral lean Standing balance support: Bilateral upper extremity supported Standing balance-Leahy Scale: Poor Standing balance comment: Flexed posture, narrow BOS and poor ability to come to upright, reliance on Bilateral UE support.                           ADL either performed or assessed with clinical judgement   ADL Overall ADL's : Needs assistance/impaired Eating/Feeding: Set up;Supervision/ safety;Sitting   Grooming: Set up;Supervision/safety;Sitting   Upper Body Bathing: Minimal assistance;Sitting   Lower Body Bathing: Moderate assistance;Sit to/from stand   Upper Body Dressing : Minimal assistance;Sitting   Lower Body Dressing: Minimal assistance;Sit to/from stand Lower Body Dressing Details (indicate cue type and reason): Pt able to don socks in sitting with min assist. Would need additional assist in standing for other clothing Toilet Transfer: Moderate assistance;+2 for physical assistance;Ambulation Toilet Transfer Details (indicate cue type and reason): Simulated by sit to stand from EOB with functional mobility in room         Functional mobility during ADLs: Moderate assistance;+2 for physical assistance       Vision   Additional Comments: Needs further assessment     Perception     Praxis      Pertinent Vitals/Pain Pain Assessment: Faces Faces Pain Scale: Hurts little more Pain Location: generalized Pain Descriptors / Indicators: Grimacing Pain Intervention(s): Monitored during session;Repositioned     Hand Dominance  Extremity/Trunk Assessment Upper Extremity Assessment Upper Extremity Assessment: Generalized weakness   Lower Extremity Assessment Lower Extremity Assessment: Defer to PT evaluation        Communication Communication Communication: HOH   Cognition Arousal/Alertness: Awake/alert Behavior During Therapy: WFL for tasks assessed/performed Overall Cognitive Status: Impaired/Different from baseline Area of Impairment: Orientation;Attention;Memory;Following commands;Safety/judgement;Awareness;Problem solving                 Orientation Level: Disoriented to;Place;Time;Situation Current Attention Level: Sustained Memory: Decreased recall of precautions;Decreased short-term memory Following Commands: Follows one step commands with increased time;Follows multi-step commands inconsistently Safety/Judgement: Decreased awareness of safety;Decreased awareness of deficits Awareness: Intellectual Problem Solving: Difficulty sequencing;Slow processing;Requires verbal cues;Requires tactile cues     General Comments       Exercises     Shoulder Instructions      Home Living Family/patient expects to be discharged to:: Private residence(unless otherwise recomended) Living Arrangements: Other relatives   Type of Home: House Home Access: Stairs to enter [employer] of Steps: 2 Entrance Stairs-Rails: None Home Layout: One level     Bathroom Shower/Tub: Chief Strategy Officer: Standard     Home Equipment: None(per daughter, pt refuses AD)          Prior Functioning/Environment Level of Independence: Independent                 OT Problem List: Decreased strength;Decreased activity tolerance;Impaired balance (sitting and/or standing);Decreased coordination;Decreased cognition;Decreased safety awareness;Decreased knowledge of use of DME or AE;Pain      OT Treatment/Interventions: Self-care/ADL training;Therapeutic exercise;Neuromuscular education;Energy conservation;DME and/or AE instruction;Therapeutic activities;Cognitive remediation/compensation;Patient/family education;Balance training    OT Goals(Current goals can be found in the  care plan section) Acute Rehab OT Goals Patient Stated Goal: to get better OT Goal Formulation: With patient/family Time For Goal Achievement: 06/29/18 Potential to Achieve Goals: Good ADL Goals Pt Will Perform Grooming: with min guard assist;standing Pt Will Perform Upper Body Bathing: with set-up;with supervision;sitting Pt Will Perform Lower Body Bathing: with min guard assist;sit to/from stand Pt Will Transfer to Toilet: with min guard assist;ambulating;bedside commode Pt Will Perform Toileting - Clothing Manipulation and hygiene: with min guard assist;sit to/from stand Additional ADL Goal #1: Pt will follow 3 step command in a minimally distracting environment.  OT Frequency: Min 3X/week   Barriers to D/C:            Co-evaluation PT/OT/SLP Co-Evaluation/Treatment: Yes Reason for Co-Treatment: Complexity of the patient's impairments (multi-system involvement) PT goals addressed during session: Mobility/safety with mobility;Balance OT goals addressed during session: ADL's and self-care      AM-PAC PT "6 Clicks" Daily Activity     Outcome Measure Help from another person eating meals?: A Little Help from another person taking care of personal grooming?: A Little Help from another person toileting, which includes using toliet, bedpan, or urinal?: A Lot Help from another person bathing (including washing, rinsing, drying)?: A Lot Help from another person to put on and taking off regular upper body clothing?: A Little Help from another person to put on and taking off regular lower body clothing?: A Lot 6 Click Score: 15   End of Session Equipment Utilized During Treatment: Gait belt Nurse Communication: Mobility status  Activity Tolerance: Patient tolerated treatment well Patient left: in chair;with call bell/phone within reach;with chair alarm set;with family/visitor present  OT Visit Diagnosis: Unsteadiness on feet (R26.81);Other abnormalities of gait and mobility  (R26.89);History of falling (Z91.81);Muscle weakness (generalized) (M62.81);Other symptoms and signs involving cognitive function  Time: 1340-1405 OT Time Calculation (min): 25 min Charges:  OT General Charges $OT Visit: 1 Visit OT Evaluation $OT Eval Moderate Complexity: 1 Mod  Eiliana Drone A. Brett Albino, M.S., OTR/L Acute Rehab Department: [phone]  Gaye Alken 06/15/2018, 4:16 PM

## 2018-06-15 NOTE — Progress Notes (Signed)
Rehab Admissions Coordinator Note:  Patient was screened by Clois DupesBoyette, Teneil Shiller Godwin for appropriateness for an Inpatient Acute Rehab Consult.  At this time, we are recommending Inpatient Rehab consult.  Clois DupesBoyette, Sherilee Smotherman Godwin 06/15/2018, 7:36 PM  I can be reached at [phone].

## 2018-06-16 DIAGNOSIS — I619 Nontraumatic intracerebral hemorrhage, unspecified: Secondary | ICD-10-CM

## 2018-06-16 DIAGNOSIS — D72829 Elevated white blood cell count, unspecified: Secondary | ICD-10-CM

## 2018-06-16 DIAGNOSIS — N183 Chronic kidney disease, stage 3 unspecified: Secondary | ICD-10-CM

## 2018-06-16 DIAGNOSIS — Z8673 Personal history of transient ischemic attack (TIA), and cerebral infarction without residual deficits: Secondary | ICD-10-CM

## 2018-06-16 DIAGNOSIS — E876 Hypokalemia: Secondary | ICD-10-CM | POA: Diagnosis present

## 2018-06-16 DIAGNOSIS — I615 Nontraumatic intracerebral hemorrhage, intraventricular: Secondary | ICD-10-CM

## 2018-06-16 DIAGNOSIS — E119 Type 2 diabetes mellitus without complications: Secondary | ICD-10-CM

## 2018-06-16 DIAGNOSIS — R0682 Tachypnea, not elsewhere classified: Secondary | ICD-10-CM

## 2018-06-16 DIAGNOSIS — F03918 Unspecified dementia, unspecified severity, with other behavioral disturbance: Secondary | ICD-10-CM | POA: Diagnosis present

## 2018-06-16 DIAGNOSIS — F0390 Unspecified dementia without behavioral disturbance: Secondary | ICD-10-CM

## 2018-06-16 DIAGNOSIS — R410 Disorientation, unspecified: Secondary | ICD-10-CM | POA: Diagnosis present

## 2018-06-16 DIAGNOSIS — F0391 Unspecified dementia with behavioral disturbance: Secondary | ICD-10-CM

## 2018-06-16 DIAGNOSIS — S0101XA Laceration without foreign body of scalp, initial encounter: Secondary | ICD-10-CM

## 2018-06-16 DIAGNOSIS — I10 Essential (primary) hypertension: Secondary | ICD-10-CM

## 2018-06-16 DIAGNOSIS — R1310 Dysphagia, unspecified: Secondary | ICD-10-CM

## 2018-06-16 LAB — CBC
HEMATOCRIT: 37 % — AB (ref 39.0–52.0)
HEMOGLOBIN: 12.1 g/dL — AB (ref 13.0–17.0)
MCH: 29.7 pg (ref 26.0–34.0)
MCHC: 32.7 g/dL (ref 30.0–36.0)
MCV: 90.9 fL (ref 78.0–100.0)
Platelets: 160 10*3/uL (ref 150–400)
RBC: 4.07 MIL/uL — ABNORMAL LOW (ref 4.22–5.81)
RDW: 13.2 % (ref 11.5–15.5)
WBC: 12.4 10*3/uL — ABNORMAL HIGH (ref 4.0–10.5)

## 2018-06-16 LAB — BASIC METABOLIC PANEL
ANION GAP: 12 (ref 5–15)
BUN: 33 mg/dL — ABNORMAL HIGH (ref 8–23)
CHLORIDE: 102 mmol/L (ref 98–111)
CO2: 24 mmol/L (ref 22–32)
CREATININE: 1.77 mg/dL — AB (ref 0.61–1.24)
Calcium: 9 mg/dL (ref 8.9–10.3)
GFR calc non Af Amer: 35 mL/min — ABNORMAL LOW (ref >60)
GFR, EST AFRICAN AMERICAN: 41 mL/min — AB (ref >60)
Glucose, Bld: 236 mg/dL — ABNORMAL HIGH (ref 70–99)
Potassium: 3.4 mmol/L — ABNORMAL LOW (ref 3.5–5.1)
Sodium: 138 mmol/L (ref 135–145)

## 2018-06-16 LAB — GLUCOSE, CAPILLARY
Glucose-Capillary: 247 mg/dL — ABNORMAL HIGH (ref 70–99)
Glucose-Capillary: 141 mg/dL — ABNORMAL HIGH (ref 70–99)
Glucose-Capillary: 222 mg/dL — ABNORMAL HIGH (ref 70–99)
Glucose-Capillary: 174 mg/dL — ABNORMAL HIGH (ref 70–99)

## 2018-06-16 MED ORDER — CLONIDINE HCL 0.1 MG PO TABS
0.3000 mg | ORAL_TABLET | Freq: Three times a day (TID) | ORAL | Status: DC
  Administered 2018-06-16 – 2018-06-18 (×6): 0.3 mg via ORAL
  Filled 2018-06-16 (×6): qty 3

## 2018-06-16 MED ORDER — HALOPERIDOL LACTATE 5 MG/ML IJ SOLN
2.5000 mg | Freq: Four times a day (QID) | INTRAMUSCULAR | Status: DC | PRN
  Administered 2018-06-16: 2.5 mg via INTRAVENOUS
  Filled 2018-06-16: qty 1

## 2018-06-16 MED ORDER — QUETIAPINE FUMARATE 25 MG PO TABS
12.5000 mg | ORAL_TABLET | Freq: Every day | ORAL | Status: DC
  Administered 2018-06-16: 12.5 mg via ORAL
  Filled 2018-06-16 (×2): qty 1

## 2018-06-16 MED ORDER — PANTOPRAZOLE SODIUM 40 MG PO PACK
40.0000 mg | PACK | Freq: Every day | ORAL | Status: DC
  Administered 2018-06-17 – 2018-06-18 (×2): 40 mg
  Filled 2018-06-16 (×2): qty 20

## 2018-06-16 MED ORDER — HYDRALAZINE HCL 50 MG PO TABS
50.0000 mg | ORAL_TABLET | Freq: Three times a day (TID) | ORAL | Status: DC
  Administered 2018-06-16 – 2018-06-18 (×8): 50 mg via ORAL
  Filled 2018-06-16 (×8): qty 1

## 2018-06-16 NOTE — Progress Notes (Signed)
Inpatient Rehabilitation Admissions Coordinator  I contacted pt's granddaughter, Kennyth ArnoldStacy, by phone. She states her cousin and his wife live with patient. Pta he was self sufficient with his adls and drove. Kennyth ArnoldStacy would manage his pill box for med administration and attend MD appointments with him. She states she and her cousin can provide 24/7 assist of patient after d/c once he completes his rehab. She prefers CIR rather than SNF. She is to clarify Medical POA and arrange to have copy placed to chart. I will begin insurance authorization for a possible inpt rehab admit pending insurance approval when pt medically ready for d/c.  Ottie GlazierBarbara Narcissus Detwiler, RN, MSN Rehab Admissions Coordinator [phone] 06/16/2018 12:20 PM

## 2018-06-16 NOTE — Progress Notes (Signed)
  Speech Language Pathology Treatment: Dysphagia;Cognitive-Linquistic  Patient Details Name: Mark Montoya MRN: [id number] DOB: 11-21-1940 Today's Date: 06/16/2018 Time: [id number] SLP Time Calculation (min) (ACUTE ONLY): 15 min  Assessment / Plan / Recommendation Clinical Impression  Pt has been tolerating honey thick liquids and pureed solids per RN. Pt appears more attentive and appropriate today than in prior documentation. Able to sustain attention to snack, follow one step commands, mod assist for basic functional problem solving. Pt verbally communicating simple wants and needs with accurate language. No signs of aspiration with honey thick liquids, no oral holding. Trial of regular solids given with slow mastication, mild bilateral buccal pocketing. Expect diet upgrade next session if cognition continues to improve.    HPI HPI: Patient is a 77 y.o. male with PMH: CVA, HTN, CAD, who [facility] secondary to head injury where he was found to be hypotensive. Head CT revealed multiple areas of ICH including a significant hematoma in left temporal lobe. Repeat CT 7/28 showed Increased size of left intraventricular hemorrhage, now measuring 6.7 x 2.8 x 3.0 cm without hydrocephalus, Increase in diffuse subarachnoid hemorrhage, Stable remote lacunar infarcts involving the basal ganglia. BSE ordered. CXR No acute findings. Stable subsegmental atelectasis at the left lung       SLP Plan  Continue with current plan of care       Recommendations  Diet recommendations: Dysphagia 1 (puree);Honey-thick liquid Liquids provided via: Cup Medication Administration: Crushed with puree Supervision: Full supervision/cueing for compensatory strategies;Staff to assist with self feeding Compensations: Minimize environmental distractions;Slow rate;Small sips/bites Postural Changes and/or Swallow Maneuvers: Seated upright 90 degrees                Plan: Continue with current  plan of care       GO               [facility] For Children And AdolescentsBonnie Matt Delpizzo, MA CCC-SLP [phone]  Claudine MoutonDeBlois, Haliegh Khurana Caroline 06/16/2018, 12:23 PM

## 2018-06-16 NOTE — PMR Pre-admission (Addendum)
PMR Admission Coordinator Pre-Admission Assessment  Patient: Mark Montoya is an 77 y.o., male MRN: [id number] DOB: 05-26-1941 Height: 5\' 6"  (167.6 cm) Weight: 78.5 kg (173 lb 1 oz)              Insurance Information HMO: yes    PPO:      PCP:      IPA:      80/20:      OTHER: medicare advantage plan PRIMARY: [employer] Medicare      Policy#: [id number]      Subscriber: pt CM Name: Jasmine Pang      Phone#: [phone]     Fax#: [phone] Pre-Cert#: [id number] approved for 7 days with f/u Rebeca Alert phone [phone] fax [phone]      Employer: retired Benefits:  Phone #: [phone]     Name: 06/16/18 Eff. Date: 11/19/2017     Deduct: none      Out of Pocket Max: $4400      Life Max: none CIR: $345 co pay per day, days 1 until 5      SNF: no co pay days 1 until 20; $160 co pay per day days 21 until 48; no co pay days 49 until 100 Outpatient: $40 co pay per visit     Co-Pay: visits per medical neccesity Home Health: 100%      Co-Pay: visits per medical neccesity DME: 80%     Co-Pay: 20% Providers: in network  SECONDARY: none       Medicaid Application Date:       Case Manager:  Disability Application Date:       Case Worker:   Emergency Conservator, museum/gallery Information    Name Relation Home Work Mobile   Kingsley, Farace   [phone]   Baumbach,Stacy Granddaughter [phone]       Current Medical History  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  Presented [facility]/13/2018 to [facility] after a fall with laceration left parietal scalp.  Patient cannot recall full events of the fall.  Patient noted to be hypertensive.  Cranial CT scan showed left temporal hemorrhage.  Per report, 5.5 cc left temporal lobe  hematoma.  Small volume left lateral intraventricular hemorrhage.  Moderate left scalp hematoma without skull fracture.  Multiple old small infarcts.  MRI of the brain again demonstrated left temporal lobe hemorrhage with surrounding edema as well as mild regional mass-effect.  MRA with no large vessel occlusion or stenosis.  Echocardiogram with ejection fraction of 70% no wall motion abnormalities.  Patient also underwent repair of scalp laceration.  Latest fall cranial CT scan 06/15/2018 showing increased size of left ventricular hemorrhage measuring 6.7 x 2.8 x 3.0 cm without hydrocephalus.  Increase in diffuse subarachnoid hemorrhage noted predominantly in the posterior temporal parietal lobes, the right occipital lobe and along the medial frontal lobes bilaterally.  No significant change in mass-effect and continue to monitor.  Subcutaneous heparin initiated for DVT prophylaxis 06/17/2018 currently on a dysphagia #1 honey thick liquid diet.    Total: 4 NIHSS    Past Medical History  Past Medical History:  Diagnosis Date  . Abnormal echocardiogram    NUCLEAR STRESS TEST, 01/28/2007 - EKG negative for ischemia, no significant ischemia demonstrated  . Coronary artery  disease 02/03/02   status post CABG by Dr. Andrey Spearman. LIMA-LAD, seq SVG-D1-D2, SVG-OM, SVG-PDA.  Marland Kitchen Hyperlipidemia   . Hypertension   . Stroke (HCC)   . Structural heart disease    2D ECHO, 01/08/2005 - normal-mild  . Syncope    CAROTID DOPPLER, 01/08/2005 - No significant carotid vadcular disease  . Type 2 diabetes mellitus (HCC)     Family History  family history includes CVA in his father; Cancer in his brother, brother, and brother; Diabetes in his daughter; Heart disease in his brother, brother, brother, daughter, father, and mother; Heart failure in his mother; Hypertension in his daughter; Lupus in his daughter; Multiple sclerosis in his daughter.  Prior Rehab/Hospitalizations:  Has the patient had major surgery  during 100 days prior to admission? No  Current Medications   Current Facility-Administered Medications:  .  0.9 %  sodium chloride infusion, , Intravenous, Continuous, Marvel Plan, MD, Stopped at 06/17/18 1004 .  acetaminophen (TYLENOL) tablet 650 mg, 650 mg, Oral, Q4H PRN, 650 mg at 06/15/18 2044 **OR** acetaminophen (TYLENOL) solution 650 mg, 650 mg, Per Tube, Q4H PRN **OR** acetaminophen (TYLENOL) suppository 650 mg, 650 mg, Rectal, Q4H PRN, Rejeana Brock, MD, 650 mg at 06/14/18 1623 .  ALPRAZolam Prudy Feeler) tablet 0.125 mg, 0.125 mg, Oral, QHS, Eshraghi, Shervin, MD, 0.125 mg at 06/17/18 2330 .  amLODipine (NORVASC) tablet 10 mg, 10 mg, Oral, Daily, Rejeana Brock, MD, 10 mg at 06/18/18 5366 .  chlorhexidine (PERIDEX) 0.12 % solution 15 mL, 15 mL, Mouth Rinse, BID, Micki Riley, MD, 15 mL at 06/17/18 2200 .  cloNIDine (CATAPRES) tablet 0.3 mg, 0.3 mg, Oral, TID, Marvel Plan, MD, 0.3 mg at 06/18/18 4403 .  donepezil (ARICEPT) tablet 10 mg, 10 mg, Oral, QHS, Nyanor, Pearl O, NP, 10 mg at 06/17/18 2331 .  ferrous sulfate tablet 325 mg, 325 mg, Oral, Q breakfast, Nyanor, Pearl O, NP, 325 mg at 06/18/18 0926 .  glipiZIDE (GLUCOTROL) tablet 5 mg, 5 mg, Oral, QAC breakfast, Marvel Plan, MD, 5 mg at 06/18/18 4742 .  haloperidol lactate (HALDOL) injection 2.5 mg, 2.5 mg, Intravenous, Q6H PRN, Marvel Plan, MD, 2.5 mg at 06/16/18 1749 .  heparin injection 5,000 Units, 5,000 Units, Subcutaneous, Q8H, Marvel Plan, MD, 5,000 Units at 06/18/18 [phone] .  hydrALAZINE (APRESOLINE) tablet 50 mg, 50 mg, Oral, Q8H, Marvel Plan, MD, 50 mg at 06/18/18 0608 .  insulin aspart (novoLOG) injection 0-15 Units, 0-15 Units, Subcutaneous, TID WC, Rejeana Brock, MD, 5 Units at 06/18/18 0720 .  labetalol (NORMODYNE,TRANDATE) injection 10 mg, 10 mg, Intravenous, Q2H PRN, Weston Settle, MD, 10 mg at 06/17/18 0207 .  MEDLINE mouth rinse, 15 mL, Mouth Rinse, q12n4p, Micki Riley, MD, 15 mL at  06/17/18 1653 .  metFORMIN (GLUCOPHAGE) tablet 1,000 mg, 1,000 mg, Oral, BID WC, Marvel Plan, MD, 1,000 mg at 06/18/18 0923 .  pantoprazole sodium (PROTONIX) 40 mg/20 mL oral suspension 40 mg, 40 mg, Per Tube, Daily, Marvel Plan, MD, 40 mg at 06/18/18 0927 .  QUEtiapine (SEROQUEL) tablet 12.5 mg, 12.5 mg, Oral, BID, Marvel Plan, MD, 12.5 mg at 06/18/18 0923 .  RESOURCE THICKENUP CLEAR, , Oral, PRN, Micki Riley, MD .  senna-docusate (Senokot-S) tablet 1 tablet, 1 tablet, Oral, BID, Rejeana Brock, MD, 1 tablet at 06/18/18 [phone] .  sertraline (ZOLOFT) tablet 100 mg, 100 mg, Oral, Daily, Rejeana Brock, MD, 100 mg at 06/18/18 6433 .  tamsulosin (FLOMAX) capsule 0.8 mg, 0.8 mg, Oral,  QPC breakfast, Rejeana Brock, MD, 0.8 mg at 06/18/18 1610  Patients Current Diet:  Diet Order           DIET - DYS 1 Room service appropriate? Yes; Fluid consistency: Honey Thick  Diet effective now          Precautions / Restrictions Precautions Precautions: Fall Restrictions Weight Bearing Restrictions: No   Has the patient had 2 or more falls or a fall with injury in the past year?No  Prior Activity Level Limited Community (1-2x/wk): Independent and driving; did own Administrator / Equipment Home Assistive Devices/Equipment: Grab bars in shower Home Equipment: None(per daughter, pt refuses AD)  Prior Device Use: Indicate devices/aids used by the patient prior to current illness, exacerbation or injury? None of the above  Prior Functional Level Prior Function Level of Independence: Independent  Self Care: Did the patient need help bathing, dressing, using the toilet or eating?  Independent  Indoor Mobility: Did the patient need assistance with walking from room to room (with or without device)? Independent  Stairs: Did the patient need assistance with internal or external stairs (with or without device)? Independent  Functional Cognition: Did the  patient need help planning regular tasks such as shopping or remembering to take medications? Independent  Current Functional Level Cognition  Arousal/Alertness: Lethargic Overall Cognitive Status: Impaired/Different from baseline Current Attention Level: Sustained Orientation Level: Disoriented to place, Disoriented to time, Disoriented to situation Following Commands: Follows one step commands with increased time, Follows multi-step commands inconsistently Safety/Judgement: Decreased awareness of safety, Decreased awareness of deficits Attention: Focused Focused Attention: Impaired Focused Attention Impairment: Verbal basic, Functional basic    Extremity Assessment (includes Sensation/Coordination)  Upper Extremity Assessment: Generalized weakness  Lower Extremity Assessment: Defer to PT evaluation    ADLs  Overall ADL's : Needs assistance/impaired Eating/Feeding: Set up, Supervision/ safety, Sitting Grooming: Set up, Supervision/safety, Sitting Upper Body Bathing: Minimal assistance, Sitting Lower Body Bathing: Moderate assistance, Sit to/from stand Upper Body Dressing : Minimal assistance, Sitting Lower Body Dressing: Minimal assistance, Sit to/from stand Lower Body Dressing Details (indicate cue type and reason): Pt able to don socks in sitting with min assist. Would need additional assist in standing for other clothing Toilet Transfer: Moderate assistance, +2 for physical assistance, Ambulation Toilet Transfer Details (indicate cue type and reason): Simulated by sit to stand from EOB with functional mobility in room Functional mobility during ADLs: Moderate assistance, +2 for physical assistance    Mobility  Overal bed mobility: Needs Assistance Bed Mobility: Supine to Sit Supine to sit: Min assist General bed mobility comments: Min assist with cues for initiation    Transfers  Overall transfer level: Needs assistance Equipment used: Rolling walker (2  wheeled) Transfers: Sit to/from Stand Sit to Stand: +2 physical assistance, Mod assist General transfer comment: Moderate assist to power up to standing with bilateral UE support. Some posterior lean upon initial elevation to upright    Ambulation / Gait / Stairs / Wheelchair Mobility  Ambulation/Gait Ambulation/Gait assistance: Mod assist, +2 safety/equipment Gait Distance (Feet): 60 Feet Assistive device: Rolling walker (2 wheeled) Gait Pattern/deviations: Step-to pattern, Decreased stride length, Shuffle, Scissoring, Drifts right/left, Trunk flexed General Gait Details: Patient with improved step initiation today but noted instability despite UE support. Patient requiring wrap around facilitation of upright, multi modal cues for posture and positioning. Tactile cues to facilitate weight shift. Gait velocity: decreased Gait velocity interpretation: <1.31 ft/sec, indicative of household ambulator    Posture / Balance Dynamic  Sitting Balance Sitting balance - Comments: fair with static sitting, poor with dynamic sitting right lateral list noted Balance Overall balance assessment: Needs assistance Sitting-balance support: Feet supported Sitting balance-Leahy Scale: Fair Sitting balance - Comments: fair with static sitting, poor with dynamic sitting right lateral list noted Postural control: Right lateral lean Standing balance support: Bilateral upper extremity supported Standing balance-Leahy Scale: Poor Standing balance comment: Continues to show flexed posture and poor stability    Special needs/care consideration BiPAP/CPAP  N/a CPM  N/a Continuous Drip IV n/a Dialysis n/a Life Vest n/a Oxygen n/a Special Bed n/a Trach Size n/a Wound Vac n/a Skin ecchymosis and laceration to head from fall on admission; ecchymosis to bilateral upper arms                           Bowel mgmt: incontinent LBM 06/17/18 Bladder mgmt: external catheter Diabetic mgmt n/a Sitter due to impulsive and  decreased safety awareness    Previous Home Environment Living Arrangements: (Grandson and his wife live with patient)  Lives With: Family Available Help at Discharge: Family, Available 24 hours/day Type of Home: House Home Layout: One level Home Access: Stairs to enter Entrance Stairs-Rails: None Secretary/administratorntrance Stairs-Number of Steps: 2 Bathroom Shower/Tub: Hydrographic surveyorTub/shower unit, Engineer, building servicesCurtain Bathroom Toilet: Standard Bathroom Accessibility: Yes How Accessible: Accessible via walker Home Care Services: No  Discharge Living Setting Plans for Discharge Living Setting: Patient's home(pt's grandson and grandson's wife live with pt) Type of Home at Discharge: House Discharge Home Layout: One level Discharge Home Access: Stairs to enter Entrance Stairs-Rails: None Entrance Stairs-Number of Steps: 2 Discharge Bathroom Shower/Tub: Tub/shower unit, Curtain Discharge Bathroom Toilet: Standard Discharge Bathroom Accessibility: Yes How Accessible: Accessible via walker Does the patient have any problems obtaining your medications?: No  Social/Family/Support Systems Patient Roles: Parent(pt's children deceased; grandchildren are NOK) Contact Information: Primary is Ivin BootyJoshua and second is Insurance risk surveyortacy Anticipated Caregiver: Ivin BootyJoshua and his wife as well as Copywriter, advertisingtacy Anticipated Caregiver's Contact Information: see above; Ivin BootyJoshua is primary Ability/Limitations of Caregiver: they will work around their schedule to provide 24/7 assist Caregiver Availability: 24/7 Discharge Plan Discussed with Primary Caregiver: Yes Is Caregiver In Agreement with Plan?: Yes Does Caregiver/Family have Issues with Lodging/Transportation while Pt is in Rehab?: No  Goals/Additional Needs Patient/Family Goal for Rehab: supervision to min assist with PT, OT, and SLP Expected length of stay: ELOS 18 to 22 days Special Service Needs: decreased safety awareness; sitter in acute ICU  Pt/Family Agrees to Admission and willing to participate:  Yes Program Orientation Provided & Reviewed with Pt/Caregiver Including Roles  & Responsibilities: Yes  Decrease burden of Care through IP rehab admission: n/a  Possible need for SNF placement upon discharge: not anticipated  Patient Condition: This patient's condition remains as documented in the consult dated 06/16/2018, in which the Rehabilitation Physician determined and documented that the patient's condition is appropriate for intensive rehabilitative care in an inpatient rehabilitation facility. Will admit to inpatient rehab today.  Preadmission Screen Completed By:  Clois DupesBoyette, Barbara Godwin, 06/18/2018 9:53 AM ______________________________________________________________________   Discussed status with Dr. Allena KatzPatel on 06/18/2018 at  [phone] and received telephone approval for admission today.  Admission Coordinator:  Clois DupesBoyette, Barbara Godwin, time [id number] Date 06/18/2018

## 2018-06-16 NOTE — Progress Notes (Addendum)
Inpatient Rehabilitation Admissions Coordinator  I spoke with grandson Mark Montoya. He states he is POA and will bring paperwork to hospital to have placed in chart. He also prefers inpt rehab rather than SNF. I await insurance approval to plan admit.Remains on cleviprex.  I will follow up tomorrow.  Ottie GlazierBarbara Sanye Ledesma, RN, MSN Rehab Admissions Coordinator [phone] 06/16/2018 3:54 PM

## 2018-06-16 NOTE — Progress Notes (Signed)
STROKE TEAM - DAILY PROGRESS NOTE    SUBJECTIVE Patient was sitting in chair, calm during the morning rounding.  Still confused with slurred speech.  No focal neurological deficit.  However, in the afternoon hours, patient started to have sundowning with agitation, combative, required Haldol and soft restraint.  OBJECTIVE Most recent Vital Signs: Vitals:   06/16/18 1445 06/16/18 1500 06/16/18 1515 06/16/18 1530  BP: (!) 165/77 (!) 158/46 (!) 162/76 (!) 153/123  Pulse: 76 87 (!) 58 73  Resp: (!) 25 (!) 23 (!) 21 18  Temp:      TempSrc:      SpO2: (!) 86% (!) 86% (!) 83% 94%  Weight:      Height:       CBG (last 3)  Recent Labs    06/15/18 2119 06/16/18 0833 06/16/18 1154  GLUCAP 213* 247* 141*    Physical Exam  HEENT-left occipital scalp hematoma with oozing of blood.  Normal external eye and conjunctiva.  Very poor dentition Cardiovascular- S1-S2 audible, pulses palpable throughout   Lungs-no rhonchi or wheezing noted, no excessive working breathing.  Saturations within normal limits Abdomen- All 4 quadrants palpated and nontender Musculoskeletal-no joint tenderness, deformity or swelling patient does move extremities Skin-warm and dry  Neuro:  Mental Status: Patient is awake and alert, not orientated to place, time, not able to name but able to repeat. Patient  follows some admitted commands, but not most. Cranial Nerves: intact. Motor: he does move all extremities equally Right : Upper extremity   5/5    Left:     Upper extremity   5/5  Lower extremity   5/5     Lower extremity   5/5 Tone and bulk:normal tone throughout; no atrophy noted Sensory: Patient does withdraw to stimuli and is in arms and legs  Deep Tendon Reflexes: 2+ and symmetric throughout Plantars: Right: downgoing   Left: downgoing Cerebellar: Minimally able to follow commands father cerebellar testing, but not ataxic with  finger-to-nose,  bilateral upper extremity action tremor. Gait: Not assessed  IV Fluid Intake:   . clevidipine 15 mg/hr (06/16/18 1500)    MEDICATIONS  . ALPRAZolam  0.125 mg Oral QHS  . amLODipine  10 mg Oral Daily  . chlorhexidine  15 mL Mouth Rinse BID  . cloNIDine  0.3 mg Oral BID  . donepezil  10 mg Oral QHS  . ferrous sulfate  325 mg Oral Q breakfast  . hydrALAZINE  50 mg Oral Q8H  . insulin aspart  0-15 Units Subcutaneous TID WC  . mouth rinse  15 mL Mouth Rinse q12n4p  . pantoprazole sodium  40 mg Per Tube Daily  . QUEtiapine  12.5 mg Oral QHS  . senna-docusate  1 tablet Oral BID  . sertraline  100 mg Oral Daily  . tamsulosin  0.8 mg Oral QPC breakfast   PRN:  acetaminophen **OR** acetaminophen (TYLENOL) oral liquid 160 mg/5 mL **OR** acetaminophen, diphenhydrAMINE, labetalol, RESOURCE THICKENUP CLEAR  Diet:   Diet Order           DIET - DYS 1 Room service appropriate? Yes; Fluid consistency: Honey Thick  Diet effective now  CLINICALLY SIGNIFICANT STUDIES Basic Metabolic Panel:  Recent Labs  Lab 06/15/18 0151 06/16/18 0308  NA 140 138  K 3.2* 3.4*  CL 99 102  CO2 26 24  GLUCOSE 174* 236*  BUN 25* 33*  CREATININE 1.92* 1.77*  CALCIUM 9.2 9.0  MG 2.0  --    Liver Function Tests: No results for input(s): AST, ALT, ALKPHOS, BILITOT, PROT, ALBUMIN in the last 168 hours. CBC:  Recent Labs  Lab 06/12/18 1955  06/15/18 0151 06/16/18 0308  WBC 9.2   < > 13.9* 12.4*  NEUTROABS 6.7  --   --   --   HGB 12.4*   < > 12.7* 12.1*  HCT 37.9*   < > 39.0 37.0*  MCV 92.4   < > 91.1 90.9  PLT 167   < > 156 160   < > = values in this interval not displayed.   Coagulation:  Recent Labs  Lab 06/13/18 0307  LABPROT 13.5  INR 1.04    Ct Head Wo Contrast  06/15/2018 IMPRESSION:  1. Increased size of left intraventricular hemorrhage, now measuring 6.7 x 2.8 x 3.0 cm without hydrocephalus. 2. Increase in diffuse subarachnoid hemorrhage noted predominantly in the  posterior temporal and parietal lobes, the right occipital lobe, and along the medial frontal lobes bilaterally. 3. No significant change in mass effect. 4. Stable atrophy and white matter disease. 5. Stable remote lacunar infarcts involving the basal ganglia.   Ct Head Wo Contrast 06/13/2018 IMPRESSION:  1. Slight increase in size of left temporal lobe intraparenchymal hematoma without midline shift or new mass effect.  2. Increased intraventricular blood in the left temporal horn without hydrocephalus.  3. Unchanged small volume subarachnoid hemorrhage along the right sylvian fissure.   Ct Head Wo Contrast 06/12/2018 IMPRESSION:  1. 5.5 cc LEFT temporal lobe hematoma likely posttraumatic given head injury though hypertensive etiology possible. Small volume LEFT lateral intraventricular hemorrhage. Small volume subarachnoid hemorrhage.  2. Moderate LEFT scalp hematoma.  No skull fracture.  3. Multiple old small infarcts. Mild chronic small vessel ischemic changes.   EKG  SR with 1st degree AV block   MRI Brain/MRA Head  06/13/2018 IMPRESSION: 1. Left temporal lobe hemorrhage with intraventricular extension appears stable since the CT earlier today. Intra-axial blood products estimated at 17 mL. Surrounding edema. Mild regional mass effect. 2. Small volume but more widespread bilateral subarachnoid hemorrhage than was evident by CT. Small volume intraventricular hemorrhage with no ventriculomegaly. 3. No acute infarct or new intracranial abnormality. 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 4. Motion degraded intracranial MRA with no large vessel occlusion or stenosis identified.  DG Chest Portable 1 View 06/14/2018 IMPRESSION: Low lung volumes with linear opacity at the left lung base potentially atelectasis or new consolidation. Surgical changes of median sternotomy and CABG.   Echocardiogram:                                                  06/14/2018 Study Conclusions - Left ventricle: The cavity size was normal. Systolic function was   vigorous. The estimated ejection fraction was in the range of 65%   to 70%. Wall motion was normal; there were no regional wall   motion abnormalities. There was an increased relative   contribution of atrial contraction to ventricular filling.   Doppler  parameters are consistent with abnormal left ventricular   relaxation (grade 1 diastolic dysfunction). - Aortic valve: Trileaflet; moderately thickened, moderately   calcified leaflets. 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 There was mild   regurgitation. Valve area (VTI): 1.51 cm^2. Valve area (Vmax):   1.44 cm^2. Valve area (Vmean): 1.48 cm^2. - Aorta: Aortic root dimension: 37 mm (ED). - Aortic root: The aortic root was mildly dilated. - Mitral valve: There was mild regurgitation. - Left atrium: The atrium was mildly dilated. - Right atrium: The atrium was mildly dilated. - Tricuspid valve: There was mild regurgitation. - Pulmonary arteries: PA peak pressure: 32 mm Hg (S).   B/L Carotid U/S:                                                    06/13/2018 Final Interpretation: Right Carotid: Velocities in the right ICA are consistent with a 1-39% stenosis. Left Carotid: Velocities in the left ICA are consistent with a 1-39% stenosis. Vertebrals: Bilateral vertebral arteries demonstrate antegrade flow.    ASSESSMENT/PLAN 77 year old male with history of stroke, CAD status post CABG 2003, hypertension, hyperlipidemia, diabetes and syncope admitted for head injury and ICH.  ICH:  Left PCA territory ICH with left lateral ventricle small IVH and right sylvian fissure SAH   CT of the brain left lateral intraventricular hemorrhage, small volume subarachnoid hemorrhage, 5.5 cc left temporal lobe   Repeat CT of the brain 06/13/18 -mild increase in the size of the left temporal lobe intraparenchymal hematoma, no hydrocephalus, no mass  Effect  or midline shift  MRI of the brain -Stable Left temporal lobe hemorrhage with intraventricular extension Surrounding edema with mild regional mass effect, small volume but more widespread bilateral subarachnoid  MRA of the brain:  no large vessel occlusion or stenosis identified.  Carotid Doppler - unremarkable  2D Echocardiogram - EF 65 - 70%. No cardiac source of emboli identified.  LDL - 35  HgbA1c - 7.0  VTE SCD  Antiplatelets: None at this time secondary to traumatic or hypertensive intracranial hemorrhage  continue Rehab with PT consult, OT consult, Speech consult  Therapy recommendations:  CIR recommended  Disposition:  pending  In-hospital delirium  Sundowning in ICU  Started Seroquel low-dose 12.5 twice daily  Haldol IV as needed  On home Aricept  Likely due to baseline dementia  left scalp hematoma secondary to trauma  CT confirmed left scalp hematoma  Supportive care with dressing  Hypertension  BP stable on Cleviprex drip overnight  Add prn Labetalol  BP goal <160.  On home amlodipine 10 mg, clonidine 0.3 mg twice daily  Add hydralazine 50 every 8  hold lisinopril and HCTZ in the setting of AKI on CKD.   Hyperlipidemia  Home meds:   Crestor 10 mg  LDL 35 goal < 70  Hold off statin at this time  CKD stage III  Creatinine 2.07, baseline 1.15  Hold all nephrotoxic medications including metformin, lisinopril for now  Renal function closely  Type 2 diabetes mellitus  Hold metformin while in hospital  Start Accu-Chek before meals and at bedtime with sliding scale  Hemoglobin A1c - 7.0  Continue carb modified diet  Leukocytosis  WBC 12-> 13.9-> 12.4  continue to monitor patient has been afebrile  Other Stroke Risk Factors  Advanced age  Smokeless Tobacco  Hx of CAD post  CABG in 2003  Family hx stroke (Father)  Other issues  Hypokalemia - supplement - 3.2 -> 3.4 (monitor)  Anxiety depression, continue sertraline  daily and Xanax as needed   Hospital day # 4  This patient is critically ill due to ICH, IVH, SAH and at significant risk of neurological worsening, death form hematoma expansion, cerebral edema and brain herniation, worsening delirium. This patient's care requires constant monitoring of vital signs, hemodynamics, respiratory and cardiac monitoring, review of multiple databases, neurological assessment, discussion with family, other specialists and medical decision making of high complexity. I spent 50 minutes of neurocritical care time in the care of this patient.  Marvel Plan, MD PhD Stroke Neurology 06/16/2018 7:16 PM   To contact Stroke Continuity provider, please refer to [email or web address]. After hours, contact General Neurology

## 2018-06-16 NOTE — Consult Note (Addendum)
Physical Medicine and Rehabilitation Consult Reason for Consult: Decreased functional mobility Referring Physician: Dr.Xu   HPI: Mark Montoya is a 77 y.o. right-handed male with history of dementia maintained on Aricept, CVA maintained on aspirin, hypertension, diabetes mellitus, CKD stage III, CAD with CABG 2003.  History taken from chart review. Presented 06/13/2018 to [facility], [place] after suspected fall with laceration left parietal scalp.  Patient could not recall full events of the fall.  Patient was noted to be hypertensive.  Cranial CT scan reviewed, showing left temporal hemorrhage. Per report, 5.5 cc left temporal lobe hematoma.  Small volume left lateral intraventricular hemorrhage.  Moderate left scalp hematoma without skull fracture.  Multiple old small infarcts.  MRI of the brain again demonstrated left temporal lobe hemorrhage with some surrounding edema as well as mild regional mass-effect.  MRA with no large vessel occlusion or stenosis.  Echocardiogram with ejection fraction of 70% no wall motion abnormalities.  Patient underwent repair of scalp laceration.  Currently on a dysphagia #1 honey thick liquid diet.  Physical and occupational therapy evaluations completed with recommendations of physical medicine rehab consult   Review of Systems  Unable to perform ROS: Mental acuity   Past Medical History:  Diagnosis Date  . Abnormal echocardiogram    NUCLEAR STRESS TEST, 01/28/2007 - EKG negative for ischemia, no significant ischemia demonstrated  . Coronary artery disease 02/03/02   status post CABG by Dr. Merilynn Finland. LIMA-LAD, seq SVG-D1-D2, SVG-OM, SVG-PDA.  Marland Kitchen Hyperlipidemia   . Hypertension   . Stroke ([place])   . Structural heart disease    2D ECHO, 01/08/2005 - normal-mild  . Syncope    CAROTID DOPPLER, 01/08/2005 - No significant carotid vadcular disease  . Type 2 diabetes mellitus ([place])    Past Surgical History:  Procedure Laterality Date  . CARDIAC  CATHETERIZATION  01/22/2002   CABG recommended  . CORONARY ARTERY BYPASS GRAFT  02/03/2002   x5; LIMA-LAD, SVG-D1 and D2, SVG-OM1, and SVG-PDA  . ORIF TIBIA PLATEAU Right 11/02/2013   Procedure: OPEN REDUCTION INTERNAL FIXATION (ORIF) RIGHT TIBIAL PLATEAU;  Surgeon: Rozanna Box, MD;  Location: [facility];  Service: Orthopedics;  Laterality: Right;   Family History  Problem Relation Age of Onset  . Heart disease Mother   . Heart failure Mother   . Heart disease Father   . CVA Father   . Cancer Brother   . Cancer Brother   . Cancer Brother   . Hypertension Daughter   . Heart disease Daughter   . Diabetes Daughter   . Multiple sclerosis Daughter   . Lupus Daughter   . Heart disease Brother        CABG  . Heart disease Brother        CABG  . Heart disease Brother        CABG   Social History:  reports that he has never smoked. His smokeless tobacco use includes chew. He reports that he does not drink alcohol or use drugs. Allergies:  Allergies  Allergen Reactions  . Lipitor [Atorvastatin Calcium]   . Other     provastatin  . Simvastatin    Medications Prior to Admission  Medication Sig Dispense Refill  . ALPRAZolam (XANAX) 0.5 MG tablet Take 0.5-1 mg by mouth 2 (two) times daily. Take 1 tablet in the morning and 2 tablets in the evening.    Marland Kitchen amLODipine (NORVASC) 10 MG tablet Take 10 mg by mouth daily.    Marland Kitchen  aspirin 81 MG tablet Take 81 mg by mouth daily.    . cloNIDine (CATAPRES) 0.1 MG tablet Take 0.3 mg by mouth 2 (two) times daily. 1 tablet in the morning and two tablets in the evening.     Marland Kitchen CRESTOR 10 MG tablet Take 10 mg by mouth daily.    . diphenhydrAMINE (BENADRYL) 25 mg capsule Take 50 mg by mouth at bedtime.    . donepezil (ARICEPT) 10 MG tablet Take 1 tablet by mouth at bedtime.    . ferrous sulfate 325 (65 FE) MG tablet Take 325 mg by mouth daily with breakfast.    . glipiZIDE (GLUCOTROL) 5 MG tablet Take 1 tablet (5 mg total) by mouth daily before breakfast. 30  tablet 0  . hydrochlorothiazide (HYDRODIURIL) 12.5 MG tablet Take 12.5 mg by mouth daily.    Marland Kitchen lisinopril (PRINIVIL,ZESTRIL) 40 MG tablet Take 20 mg by mouth daily.     . metFORMIN (GLUCOPHAGE) 500 MG tablet Take 1,000 mg by mouth 2 (two) times daily with a meal.     . Multiple Vitamin (MULTIVITAMIN) tablet Take 1 tablet by mouth daily.    . nitroGLYCERIN (NITROSTAT) 0.4 MG SL tablet     . potassium chloride SA (K-DUR,KLOR-CON) 20 MEQ tablet Take 20 mEq by mouth daily.    . sertraline (ZOLOFT) 100 MG tablet Take 100 mg by mouth daily.    . tamsulosin (FLOMAX) 0.4 MG CAPS capsule Take 2 capsules (0.8 mg total) by mouth daily after breakfast.    . vitamin C (ASCORBIC ACID) 500 MG tablet Take 500 mg by mouth daily.    . Cyanocobalamin (B-12) 1000 MCG/ML KIT Inject 1 mL as directed every 30 (thirty) days.      Home: Home Living Family/patient expects to be discharged to:: Private residence(unless otherwise recomended) Living Arrangements: Other relatives Type of Home: House Home Access: Stairs to enter Technical brewer of Steps: 2 Entrance Stairs-Rails: None Home Layout: One level Bathroom Shower/Tub: Chiropodist: [place]: None(per daughter, pt refuses AD)  Functional History: Prior Function Level of Independence: Independent Functional Status:  Mobility: Bed Mobility Overal bed mobility: Needs Assistance Bed Mobility: Supine to Sit Supine to sit: Min assist General bed mobility comments: min assist to come to EOB, increased time and effort to perform. Cues for initiation of LE movement to EOB Transfers Overall transfer level: Needs assistance Equipment used: 2 person hand held assist Transfers: Sit to/from Stand Sit to Stand: +2 physical assistance, Mod assist General transfer comment: Moderate assist to power up to standing with bilateral UE support. Some posterior lean upon initial elevation to upright Ambulation/Gait Ambulation/Gait  assistance: Mod assist, +2 physical assistance Gait Distance (Feet): 24 Feet Assistive device: 2 person hand held assist Gait Pattern/deviations: Step-to pattern, Decreased stride length, Shuffle, Scissoring, Drifts right/left, Trunk flexed General Gait Details: patient requiring increased assist to maintain upright, manual facilitation of pacing and VCs for increased stride. Flexed posture noted with decreased coordination at this time. Gait velocity: decreased Gait velocity interpretation: <1.31 ft/sec, indicative of household ambulator    ADL: ADL Overall ADL's : Needs assistance/impaired Eating/Feeding: Set up, Supervision/ safety, Sitting Grooming: Set up, Supervision/safety, Sitting Upper Body Bathing: Minimal assistance, Sitting Lower Body Bathing: Moderate assistance, Sit to/from stand Upper Body Dressing : Minimal assistance, Sitting Lower Body Dressing: Minimal assistance, Sit to/from stand Lower Body Dressing Details (indicate cue type and reason): Pt able to don socks in sitting with min assist. Would need additional assist in standing  for other clothing Toilet Transfer: Moderate assistance, +2 for physical assistance, Ambulation Toilet Transfer Details (indicate cue type and reason): Simulated by sit to stand from EOB with functional mobility in room Functional mobility during ADLs: Moderate assistance, +2 for physical assistance  Cognition: Cognition Overall Cognitive Status: Impaired/Different from baseline Arousal/Alertness: Lethargic Orientation Level: Oriented to person, Disoriented to place, Disoriented to time, Disoriented to situation Attention: Focused Focused Attention: Impaired Focused Attention Impairment: Verbal basic, Functional basic Cognition Arousal/Alertness: Awake/alert Behavior During Therapy: WFL for tasks assessed/performed Overall Cognitive Status: Impaired/Different from baseline Area of Impairment: Orientation, Attention, Memory, Following  commands, Safety/judgement, Awareness, Problem solving Orientation Level: Disoriented to, Place, Time, Situation Current Attention Level: Sustained Memory: Decreased recall of precautions, Decreased short-term memory Following Commands: Follows one step commands with increased time, Follows multi-step commands inconsistently Safety/Judgement: Decreased awareness of safety, Decreased awareness of deficits Awareness: Intellectual Problem Solving: Difficulty sequencing, Slow processing, Requires verbal cues, Requires tactile cues  Blood pressure (!) 147/75, pulse 72, temperature 98.2 F (36.8 C), temperature source Axillary, resp. rate 15, height 5' 6" (1.676 m), weight 78.5 kg (173 lb 1 oz), SpO2 96 %. Physical Exam  Vitals reviewed. Constitutional: He appears well-developed and well-nourished.  HENT:  Poor dentition.  Healing scalp laceration  Eyes: EOM are normal. Right eye exhibits no discharge. Left eye exhibits no discharge.  Neck: Normal range of motion. Neck supple. No thyromegaly present.  Cardiovascular: Normal rate, regular rhythm and normal heart sounds.  Respiratory: Effort normal and breath sounds normal. No respiratory distress.  GI: Soft. Bowel sounds are normal. He exhibits no distension.  Musculoskeletal:  No edema or tenderness in extremities  Neurological: He is alert.  Oriented to name only.  Motor (limited by participation): ?RUE/RLE: 4/5 proximal to distal ?LUE/LLE: 4+/5 proximal to distal RLE hyperrefexia  Skin:  See above  Psychiatric: His affect is blunt. His speech is delayed, tangential and slurred. He is slowed. He exhibits abnormal recent memory and abnormal remote memory.    Results for orders placed or performed during the hospital encounter of 06/12/18 (from the past 24 hour(s))  Glucose, capillary     Status: Abnormal   Collection Time: 06/15/18  7:27 AM  Result Value Ref Range   Glucose-Capillary 172 (H) 70 - 99 mg/dL  Glucose, capillary      Status: Abnormal   Collection Time: 06/15/18  1:21 PM  Result Value Ref Range   Glucose-Capillary 170 (H) 70 - 99 mg/dL  Urinalysis, Complete w Microscopic     Status: Abnormal   Collection Time: 06/15/18  2:30 PM  Result Value Ref Range   Color, Urine YELLOW YELLOW   APPearance CLOUDY (A) CLEAR   Specific Gravity, Urine 1.015 1.005 - 1.030   pH 5.0 5.0 - 8.0   Glucose, UA 50 (A) NEGATIVE mg/dL   Hgb urine dipstick NEGATIVE NEGATIVE   Bilirubin Urine NEGATIVE NEGATIVE   Ketones, ur 5 (A) NEGATIVE mg/dL   Protein, ur NEGATIVE NEGATIVE mg/dL   Nitrite NEGATIVE NEGATIVE   Leukocytes, UA NEGATIVE NEGATIVE   RBC / HPF 0-5 0 - 5 RBC/hpf   WBC, UA 0-5 0 - 5 WBC/hpf   Bacteria, UA RARE (A) NONE SEEN   Squamous Epithelial / LPF 0-5 0 - 5   Mucus PRESENT    Hyaline Casts, UA PRESENT   Glucose, capillary     Status: Abnormal   Collection Time: 06/15/18  4:33 PM  Result Value Ref Range   Glucose-Capillary 243 (H) 70 -  99 mg/dL  Glucose, capillary     Status: Abnormal   Collection Time: 06/15/18  9:19 PM  Result Value Ref Range   Glucose-Capillary 213 (H) 70 - 99 mg/dL  CBC     Status: Abnormal   Collection Time: 06/16/18  3:08 AM  Result Value Ref Range   WBC 12.4 (H) 4.0 - 10.5 K/uL   RBC 4.07 (L) 4.22 - 5.81 MIL/uL   Hemoglobin 12.1 (L) 13.0 - 17.0 g/dL   HCT 37.0 (L) 39.0 - 52.0 %   MCV 90.9 78.0 - 100.0 fL   MCH 29.7 26.0 - 34.0 pg   MCHC 32.7 30.0 - 36.0 g/dL   RDW 13.2 11.5 - 15.5 %   Platelets 160 150 - 400 K/uL  Basic metabolic panel     Status: Abnormal   Collection Time: 06/16/18  3:08 AM  Result Value Ref Range   Sodium 138 135 - 145 mmol/L   Potassium 3.4 (L) 3.5 - 5.1 mmol/L   Chloride 102 98 - 111 mmol/L   CO2 24 22 - 32 mmol/L   Glucose, Bld 236 (H) 70 - 99 mg/dL   BUN 33 (H) 8 - 23 mg/dL   Creatinine, Ser 1.77 (H) 0.61 - 1.24 mg/dL   Calcium 9.0 8.9 - 10.3 mg/dL   GFR calc non Af Amer 35 (L) >60 mL/min   GFR calc Af Amer 41 (L) >60 mL/min   Anion gap 12 5  - 15   Ct Head Wo Contrast  Result Date: 06/15/2018 CLINICAL DATA:  Intracranial hemorrhage. EXAM: CT HEAD WITHOUT CONTRAST TECHNIQUE: Contiguous axial images were obtained from the base of the skull through the vertex without intravenous contrast. COMPARISON:  CT head and MRI brain 06/13/2018. FINDINGS: Brain: The predominantly intraventricular hemorrhage has increased in size since the prior exam. It now measures 6.7 x 2.8 x 3.0 cm (volume = 29 cm^3). In the same dimensions, the hemorrhage previously measured 5.1 x 2.6 x 2.4 cm (volume = 17 cm^3). Areas of subarachnoid hemorrhage involving the left temporal and parietal lobe are more prominent than on the prior exam. Right temporal and parietal subarachnoid hemorrhage is also more prominent than on the prior exam. Areas of subarachnoid hemorrhage involving the right occipital lobe are more prominent than on the prior exam. Focal hemorrhage along the anterior aspect of the left cerebellum is similar to the prior study. Ventricular dilation has not changed. There is no hydrocephalus. A remote lacunar infarct is again noted in the right thalamus. Remote infarcts involving the left internal capsule are stable. Subarachnoid and subdural blood along the posteromedial frontal lobes bilaterally is more prominent than on the prior exam. Vascular: Atherosclerotic changes are noted within the cavernous internal carotid arteries bilaterally. There is no hyperdense vessel. Skull: Calvarium is intact. No acute or healing fractures are present. Left parietal scalp hematoma is similar the prior exam. No focal lytic or blastic lesions are present. Sinuses/Orbits: The paranasal sinuses and mastoid air cells are clear. Globes and orbits are unremarkable. IMPRESSION: 1. Increased size of left intraventricular hemorrhage, now measuring 6.7 x 2.8 x 3.0 cm without hydrocephalus. 2. Increase in diffuse subarachnoid hemorrhage noted predominantly in the posterior temporal and parietal  lobes, the right occipital lobe, and along the medial frontal lobes bilaterally. 3. No significant change in mass effect. 4. Stable atrophy and white matter disease. 5. Stable remote lacunar infarcts involving the basal ganglia. Electronically Signed   By: San Morelle M.D.   On: 06/15/2018 09:37  Dg Chest Port 1 View  Result Date: 06/15/2018 CLINICAL DATA:  Fever.  Intracranial hemorrhage. EXAM: PORTABLE CHEST 1 VIEW COMPARISON:  06/14/2018 FINDINGS: Stable heart size status post CABG. Stable mild atelectasis at the left lung base. There is no evidence of pulmonary edema, consolidation, pneumothorax, nodule or pleural fluid. IMPRESSION: No acute findings. Stable subsegmental atelectasis at the left lung base. Electronically Signed   By: Aletta Edouard M.D.   On: 06/15/2018 08:39   Dg Chest Port 1 View  Result Date: 06/14/2018 CLINICAL DATA:  77 year old male with a history of fever EXAM: PORTABLE CHEST 1 VIEW COMPARISON:  07/24/2016, 07/26/2016 FINDINGS: Cardiomediastinal silhouette likely unchanged with cardiomegaly. Calcifications of the aortic arch. Tortuosity of the thoracic aorta. Surgical changes of median sternotomy and CABG. Low lung volumes. No pneumothorax. No large pleural effusion. Linear opacity at the left lung base. No displaced fracture. IMPRESSION: Low lung volumes with linear opacity at the left lung base potentially atelectasis or new consolidation. Surgical changes of median sternotomy and CABG. Electronically Signed   By: Corrie Mckusick D.O.   On: 06/14/2018 12:18   Dg Swallowing Func-speech Pathology  Result Date: 06/15/2018 Objective Swallowing Evaluation: Type of Study: MBS-Modified Barium Swallow Study  Patient Details Name: Mark Montoya MRN: [id number] Date of Birth: August 04, 1941 Today's Date: 06/15/2018 Time: SLP Start Time (ACUTE ONLY): 1102 -SLP Stop Time (ACUTE ONLY): 1122 SLP Time Calculation (min) (ACUTE ONLY): 20 min Past Medical History: Past Medical History:  Diagnosis Date . Abnormal echocardiogram   NUCLEAR STRESS TEST, 01/28/2007 - EKG negative for ischemia, no significant ischemia demonstrated . Coronary artery disease 02/03/02  status post CABG by Dr. Merilynn Finland. LIMA-LAD, seq SVG-D1-D2, SVG-OM, SVG-PDA. Marland Kitchen Hyperlipidemia  . Hypertension  . Stroke ([place])  . Structural heart disease   2D ECHO, 01/08/2005 - normal-mild . Syncope   CAROTID DOPPLER, 01/08/2005 - No significant carotid vadcular disease . Type 2 diabetes mellitus ([place])  Past Surgical History: Past Surgical History: Procedure Laterality Date . CARDIAC CATHETERIZATION  01/22/2002  CABG recommended . CORONARY ARTERY BYPASS GRAFT  02/03/2002  x5; LIMA-LAD, SVG-D1 and D2, SVG-OM1, and SVG-PDA . ORIF TIBIA PLATEAU Right 11/02/2013  Procedure: OPEN REDUCTION INTERNAL FIXATION (ORIF) RIGHT TIBIAL PLATEAU;  Surgeon: Rozanna Box, MD;  Location: [facility];  Service: Orthopedics;  Laterality: Right; HPI: Patient is a 77 y.o. male with PMH: CVA, HTN, CAD, who presented to ER at [facility] secondary to head injury where he was found to be hypotensive. Head CT revealed multiple areas of ICH including a significant hematoma in left temporal lobe. Repeat CT 7/28 showed Increased size of left intraventricular hemorrhage, now measuring 6.7 x 2.8 x 3.0 cm without hydrocephalus, Increase in diffuse subarachnoid hemorrhage, Stable remote lacunar infarcts involving the basal ganglia. BSE ordered. CXR No acute findings. Stable subsegmental atelectasis at the left lung  Subjective: very lethargic, followed command to open eyes and sqeeze hand, but no others. Assessment / Plan / Recommendation CHL IP CLINICAL IMPRESSIONS 06/15/2018 Clinical Impression Decreased lingual manipulation, oral holding with prolonged manipulation and delayed transit in addition to frequent lingual residue. Penetration of nectar due to incomplete epiglottic deflection and laryngeal closure remaining above the vocal cords without sensation. Swallow  intermittently delayed to the valleculae and mild residue in the vallecula given inadequate inversion of epiglottis. Cognitive impairments prevent trial of compensatory strategies. Esophageal view showed stasis distal portion. Recommend Dys 1, honey thick liquids, crush meds, full supervision/assist and ensure swallow prior to giving next bite/sip.  SLP Visit Diagnosis Dysphagia, oropharyngeal phase (R13.12) Attention and concentration deficit following -- Frontal lobe and executive function deficit following -- Impact on safety and function Moderate aspiration risk   CHL IP TREATMENT RECOMMENDATION 06/15/2018 Treatment Recommendations Therapy as outlined in treatment plan below   Prognosis 06/15/2018 Prognosis for Safe Diet Advancement Good Barriers to Reach Goals Cognitive deficits Barriers/Prognosis Comment -- CHL IP DIET RECOMMENDATION 06/15/2018 SLP Diet Recommendations Dysphagia 1 (Puree) solids;Honey thick liquids Liquid Administration via Cup Medication Administration Crushed with puree Compensations Minimize environmental distractions;Slow rate;Small sips/bites Postural Changes Seated upright at 90 degrees;Remain semi-upright after after feeds/meals (Comment)   CHL IP OTHER RECOMMENDATIONS 06/15/2018 Recommended Consults -- Oral Care Recommendations Oral care BID Other Recommendations Order thickener from pharmacy   CHL IP FOLLOW UP RECOMMENDATIONS 06/15/2018 Follow up Recommendations Skilled Nursing facility   [facility] IP FREQUENCY AND DURATION 06/15/2018 Speech Therapy Frequency (ACUTE ONLY) min 2x/week Treatment Duration 2 weeks      CHL IP ORAL PHASE 06/15/2018 Oral Phase Impaired Oral - Pudding Teaspoon -- Oral - Pudding Cup -- Oral - Honey Teaspoon -- Oral - Honey Cup Lingual/palatal residue;Delayed oral transit Oral - Nectar Teaspoon -- Oral - Nectar Cup Delayed oral transit;Lingual/palatal residue Oral - Nectar Straw -- Oral - Thin Teaspoon -- Oral - Thin Cup -- Oral - Thin Straw -- Oral - Puree Delayed oral  transit;Lingual/palatal residue Oral - Mech Soft Delayed oral transit;Weak lingual manipulation Oral - Regular -- Oral - Multi-Consistency -- Oral - Pill -- Oral Phase - Comment --  CHL IP PHARYNGEAL PHASE 06/15/2018 Pharyngeal Phase Impaired Pharyngeal- Pudding Teaspoon -- Pharyngeal -- Pharyngeal- Pudding Cup -- Pharyngeal -- Pharyngeal- Honey Teaspoon -- Pharyngeal -- Pharyngeal- Honey Cup Pharyngeal residue - valleculae;Reduced airway/laryngeal closure;Reduced epiglottic inversion Pharyngeal -- Pharyngeal- Nectar Teaspoon -- Pharyngeal -- Pharyngeal- Nectar Cup Delayed swallow initiation-vallecula;Pharyngeal residue - valleculae;Penetration/Aspiration during swallow;Reduced airway/laryngeal closure Pharyngeal Material enters airway, remains ABOVE vocal cords and not ejected out Pharyngeal- Nectar Straw -- Pharyngeal -- Pharyngeal- Thin Teaspoon -- Pharyngeal -- Pharyngeal- Thin Cup -- Pharyngeal -- Pharyngeal- Thin Straw -- Pharyngeal -- Pharyngeal- Puree Pharyngeal residue - valleculae Pharyngeal -- Pharyngeal- Mechanical Soft Pharyngeal residue - valleculae Pharyngeal -- Pharyngeal- Regular -- Pharyngeal -- Pharyngeal- Multi-consistency -- Pharyngeal -- Pharyngeal- Pill -- Pharyngeal -- Pharyngeal Comment --  CHL IP CERVICAL ESOPHAGEAL PHASE 06/15/2018 Cervical Esophageal Phase WFL Pudding Teaspoon -- Pudding Cup -- Honey Teaspoon -- Honey Cup -- Nectar Teaspoon -- Nectar Cup -- Nectar Straw -- Thin Teaspoon -- Thin Cup -- Thin Straw -- Puree -- Mechanical Soft -- Regular -- Multi-consistency -- Pill -- Cervical Esophageal Comment -- [place] Siren 06/15/2018, 12:23 PM Orbie Pyo Colvin Caroli.Ed CCC-SLP Pager [phone]              Assessment/Plan: Diagnosis: TBI with left temporal lobe hemorrhage Labs and images (see above) independently reviewed.  Records reviewed and summated above.  1. Does the need for close, 24 hr/day medical supervision in concert with the patient's rehab needs make it  unreasonable for this patient to be served in a less intensive setting? Yes  2. Co-Morbidities requiring supervision/potential complications: dysphagia (advance diet as tolerated), tachypnea (monitor RR and O2 Sats with increased physical exertion), HTN (monitor and provide prns in accordance with increased physical exertion and pain, wean IV meds when appropriate), dementia (cont meds), history of CVA, DM (Monitor in accordance with exercise and adjust meds as necessary), CKD stage III (avoid nephrotoxic meds), CAD (cont meds), hypokalemia (continue to monitor and  replete as necessary), leukocytosis (cont to monitor for signs and symptoms of infection, further workup if indicated) 3. Due to bladder management, bowel management, safety, skin/wound care, disease management, medication administration, pain management and patient education, does the patient require 24 hr/day rehab nursing? Yes 4. Does the patient require coordinated care of a physician, rehab nurse, PT (1-2 hrs/day, 5 days/week), OT (1-2 hrs/day, 5 days/week) and SLP (1-2 hrs/day, 5 days/week) to address physical and functional deficits in the context of the above medical diagnosis(es)? Yes Addressing deficits in the following areas: balance, endurance, locomotion, strength, transferring, bowel/bladder control, bathing, dressing, feeding, grooming, toileting, cognition, speech, language, swallowing and psychosocial support 5. Can the patient actively participate in an intensive therapy program of at least 3 hrs of therapy per day at least 5 days per week? Yes 6. The potential for patient to make measurable gains while on inpatient rehab is excellent 7. Anticipated functional outcomes upon discharge from inpatient rehab are supervision and min assist  with PT, supervision and min assist with OT, supervision and min assist with SLP. 8. Estimated rehab length of stay to reach the above functional goals is: 18-22 days. 9. Anticipated D/C setting:  TBD 10. Anticipated post D/C treatments: HH therapy and Home excercise program 11. Overall Rehab/Functional Prognosis: good  RECOMMENDATIONS: This patient's condition is appropriate for continued rehabilitative care in the following setting: CIR once medically stable pending inquiry into baseline cognition and caregiver availablility at discharge. Patient has agreed to participate in recommended program. Potentially Note that insurance prior authorization may be required for reimbursement for recommended care.  Comment: Rehab Admissions Coordinator to follow up.   I have personally performed a face to face diagnostic evaluation, including, but not limited to relevant history and physical exam findings, of this patient and developed relevant assessment and plan.  Additionally, I have reviewed and concur with the physician assistant's documentation above.   Delice Lesch, MD, ABPMR Lavon Paganini Angiulli, PA-C 06/16/2018

## 2018-06-16 NOTE — Progress Notes (Addendum)
Inpatient Diabetes Program Recommendations  AACE/ADA: New Consensus Statement on Inpatient Glycemic Control (2019)  Target Ranges:  Prepandial:   less than 140 mg/dL      Peak postprandial:   less than 180 mg/dL (1-2 hours)      Critically ill patients:  140 - 180 mg/dL   Results for Mark Montoya, Mark Montoya (MRN [id number]) as of 06/16/2018 08:57  Ref. Range 06/15/2018 07:27 06/15/2018 13:21 06/15/2018 16:33 06/15/2018 21:19 06/16/2018 08:33  Glucose-Capillary Latest Ref Range: 70 - 99 mg/dL 409172 (H) 811170 (H) 914243 (H) 213 (H) 247 (H)   Review of Glycemic Control  Diabetes history: DM2 Outpatient Diabetes medications: Glipizide 5 mg daily, Metformin 1000 mg BID  Current orders for Inpatient glycemic control: Novolog 0-15 units TID with meals  Inpatient Diabetes Program Recommendations: Correction (SSI): Please consider increasing Novolog to resistant scale (0-20 units) and ordering Novolog 0-5 units QHS for bedtime correction scale.  Thanks, Orlando PennerMarie Felicha Frayne, RN, MSN, CDE Diabetes Coordinator Inpatient Diabetes Program [phone] (Team Pager from 8am to 5pm)

## 2018-06-16 NOTE — Progress Notes (Signed)
Physical Therapy Treatment Patient Details Name: Mark Montoya MRN: [id number] DOB: [birth year] Today's Date: 06/16/2018    History of Present Illness 77 y.o. male with a history of stroke who presents with ICH. [facility] due to head [facility] where he was found to be hypertensive. A head CT was performed and demonstrates multiple areas of ICH including a significant hematoma in the left temporal lobe.     PT Comments    Patient seen for activity progression. Confused this am, but participating well with therapies. Able to increase activity tolerance and perform gait training activities today. Ambulated in hall with moderate physical assist. Focus on stability and postural alignment. Patient responding well to cues throughout session. Current POC remains appropriate.   Follow Up Recommendations  CIR     Equipment Recommendations  (TBD)    Recommendations for Other Services Rehab consult     Precautions / Restrictions Precautions Precautions: Fall Restrictions Weight Bearing Restrictions: No    Mobility  Bed Mobility Overal bed mobility: Needs Assistance Bed Mobility: Supine to Sit     Supine to sit: Min assist     General bed mobility comments: Min assist with cues for initiation  Transfers Overall transfer level: Needs assistance Equipment used: Rolling walker (2 wheeled) Transfers: Sit to/from Stand Sit to Stand: +2 physical assistance;Mod assist         General transfer comment: Moderate assist to power up to standing with bilateral UE support. Some posterior lean upon initial elevation to upright  Ambulation/Gait Ambulation/Gait assistance: Mod assist;+2 safety/equipment Gait Distance (Feet): 60 Feet Assistive device: Rolling walker (2 wheeled) Gait Pattern/deviations: Step-to pattern;Decreased stride length;Shuffle;Scissoring;Drifts right/left;Trunk flexed Gait velocity: decreased Gait velocity interpretation: <1.31 ft/sec, indicative of  household ambulator General Gait Details: Patient with improved step initiation today but noted instability despite UE support. Patient requiring wrap around facilitation of upright, multi modal cues for posture and positioning. Tactile cues to facilitate weight shift.   Stairs             Wheelchair Mobility    Modified Rankin (Stroke Patients Only) Modified Rankin (Stroke Patients Only) Pre-Morbid Rankin Score: No symptoms Modified Rankin: Moderately severe disability     Balance Overall balance assessment: Needs assistance Sitting-balance support: Feet supported Sitting balance-Leahy Scale: Fair Sitting balance - Comments: fair with static sitting, poor with dynamic sitting right lateral list noted Postural control: Right lateral lean Standing balance support: Bilateral upper extremity supported Standing balance-Leahy Scale: Poor Standing balance comment: Continues to show flexed posture and poor stability                            Cognition Arousal/Alertness: Awake/alert Behavior During Therapy: WFL for tasks assessed/performed Overall Cognitive Status: Impaired/Different from baseline Area of Impairment: Orientation;Attention;Memory;Following commands;Safety/judgement;Awareness;Problem solving                 Orientation Level: Disoriented to;Place;Time;Situation Current Attention Level: Sustained Memory: Decreased recall of precautions;Decreased short-term memory Following Commands: Follows one step commands with increased time;Follows multi-step commands inconsistently Safety/Judgement: Decreased awareness of safety;Decreased awareness of deficits Awareness: Intellectual Problem Solving: Difficulty sequencing;Slow processing;Requires verbal cues;Requires tactile cues        Exercises      General Comments        Pertinent Vitals/Pain Pain Assessment: Faces Faces Pain Scale: Hurts little more Pain Location: headache Pain Descriptors  / Indicators: Grimacing Pain Intervention(s): Monitored during session    Home Living  Prior Function            PT Goals (current goals can now be found in the care plan section) Acute Rehab PT Goals Patient Stated Goal: to get better PT Goal Formulation: With patient/family Time For Goal Achievement: 06/29/18 Potential to Achieve Goals: Good    Frequency    Min 4X/week      PT Plan Current plan remains appropriate    Co-evaluation              AM-PAC PT "6 Clicks" Daily Activity  Outcome Measure  Difficulty turning over in bed (including adjusting bedclothes, sheets and blankets)?: A Little Difficulty moving from lying on back to sitting on the side of the bed? : Unable Difficulty sitting down on and standing up from a chair with arms (e.g., wheelchair, bedside commode, etc,.)?: Unable Help needed moving to and from a bed to chair (including a wheelchair)?: A Lot Help needed walking in hospital room?: A Lot Help needed climbing 3-5 steps with a railing? : Total 6 Click Score: 10    End of Session Equipment Utilized During Treatment: Gait belt Activity Tolerance: Patient tolerated treatment well Patient left: in chair;with call bell/phone within reach;with chair alarm set;with family/visitor present Nurse Communication: Mobility status PT Visit Diagnosis: Unsteadiness on feet (R26.81);History of falling (Z91.81);Muscle weakness (generalized) (M62.81);Other symptoms and signs involving the nervous system (R29.898)     Time: 0940-1001 PT Time Calculation (min) (ACUTE ONLY): 21 min  Charges:  $Gait Training: 8-22 mins                     Charlotte Crumb, PT DPT  Board Certified Neurologic Specialist [phone]    Mark Montoya 06/16/2018, 11:09 AM

## 2018-06-17 DIAGNOSIS — G301 Alzheimer's disease with late onset: Secondary | ICD-10-CM

## 2018-06-17 DIAGNOSIS — F0281 Dementia in other diseases classified elsewhere with behavioral disturbance: Secondary | ICD-10-CM

## 2018-06-17 LAB — GLUCOSE, CAPILLARY
GLUCOSE-CAPILLARY: 177 mg/dL — AB (ref 70–99)
GLUCOSE-CAPILLARY: 250 mg/dL — AB (ref 70–99)
Glucose-Capillary: 17 mg/dL — CL (ref 70–99)
Glucose-Capillary: 263 mg/dL — ABNORMAL HIGH (ref 70–99)
Glucose-Capillary: 248 mg/dL — ABNORMAL HIGH (ref 70–99)

## 2018-06-17 LAB — CBC
HEMATOCRIT: 34.7 % — AB (ref 39.0–52.0)
Hemoglobin: 11.6 g/dL — ABNORMAL LOW (ref 13.0–17.0)
MCH: 30.4 pg (ref 26.0–34.0)
MCHC: 33.4 g/dL (ref 30.0–36.0)
MCV: 90.8 fL (ref 78.0–100.0)
PLATELETS: 150 10*3/uL (ref 150–400)
RBC: 3.82 MIL/uL — AB (ref 4.22–5.81)
RDW: 13.2 % (ref 11.5–15.5)
WBC: 11.2 10*3/uL — AB (ref 4.0–10.5)

## 2018-06-17 LAB — BASIC METABOLIC PANEL
ANION GAP: 9 (ref 5–15)
BUN: 28 mg/dL — ABNORMAL HIGH (ref 8–23)
CO2: 24 mmol/L (ref 22–32)
Calcium: 8.8 mg/dL — ABNORMAL LOW (ref 8.9–10.3)
Chloride: 105 mmol/L (ref 98–111)
Creatinine, Ser: 1.59 mg/dL — ABNORMAL HIGH (ref 0.61–1.24)
GFR calc Af Amer: 46 mL/min — ABNORMAL LOW (ref >60)
GFR calc non Af Amer: 40 mL/min — ABNORMAL LOW (ref >60)
GLUCOSE: 275 mg/dL — AB (ref 70–99)
POTASSIUM: 3.4 mmol/L — AB (ref 3.5–5.1)
Sodium: 138 mmol/L (ref 135–145)

## 2018-06-17 MED ORDER — POTASSIUM CHLORIDE CRYS ER 20 MEQ PO TBCR
40.0000 meq | EXTENDED_RELEASE_TABLET | ORAL | Status: AC
  Administered 2018-06-17 (×2): 40 meq via ORAL
  Filled 2018-06-17 (×2): qty 2

## 2018-06-17 MED ORDER — HEPARIN SODIUM (PORCINE) 5000 UNIT/ML IJ SOLN
5000.0000 [IU] | Freq: Three times a day (TID) | INTRAMUSCULAR | Status: DC
  Administered 2018-06-17 – 2018-06-18 (×4): 5000 [IU] via SUBCUTANEOUS
  Filled 2018-06-17 (×4): qty 1

## 2018-06-17 MED ORDER — METFORMIN HCL 500 MG PO TABS
1000.0000 mg | ORAL_TABLET | Freq: Two times a day (BID) | ORAL | Status: DC
  Administered 2018-06-17 – 2018-06-18 (×3): 1000 mg via ORAL
  Filled 2018-06-17 (×3): qty 2

## 2018-06-17 MED ORDER — QUETIAPINE FUMARATE 25 MG PO TABS
12.5000 mg | ORAL_TABLET | Freq: Two times a day (BID) | ORAL | Status: DC
  Administered 2018-06-17 – 2018-06-18 (×2): 12.5 mg via ORAL
  Filled 2018-06-17 (×2): qty 1

## 2018-06-17 MED ORDER — SODIUM CHLORIDE 0.9 % IV SOLN
INTRAVENOUS | Status: DC
  Administered 2018-06-17: 08:00:00 via INTRAVENOUS

## 2018-06-17 MED ORDER — GLIPIZIDE 5 MG PO TABS
5.0000 mg | ORAL_TABLET | Freq: Every day | ORAL | Status: DC
  Administered 2018-06-18: 5 mg via ORAL
  Filled 2018-06-17: qty 1

## 2018-06-17 NOTE — Progress Notes (Signed)
Inpatient Rehabilitation Admissions Coordinator  Noted patient off cleviprex. Participated well with therapy yesterday. I will follow up tomorrow for possible admission to CIR.  Ottie GlazierBarbara Vi Whitesel, RN, MSN Rehab Admissions Coordinator [phone] 06/17/2018 11:41 AM

## 2018-06-17 NOTE — Progress Notes (Signed)
Inpatient Diabetes Program Recommendations  AACE/ADA: New Consensus Statement on Inpatient Glycemic Control (2019)  Target Ranges:  Prepandial:   less than 140 mg/dL      Peak postprandial:   less than 180 mg/dL (1-2 hours)      Critically ill patients:  140 - 180 mg/dL  Results for [name], Trentyn F (MRN [id number]) as of 06/17/2018 09:15  Ref. Range 06/16/2018 11:54 06/16/2018 17:25 06/16/2018 17:27 06/16/2018 21:23 06/17/2018 08:27  Glucose-Capillary Latest Ref Range: 70 - 99 mg/dL 782141 (H) 17 (LL) 956222 (H) 174 (H) 263 (H)   Results for [name], Mert F (MRN [id number]) as of 06/16/2018 08:57  Ref. Range 06/15/2018 07:27 06/15/2018 13:21 06/15/2018 16:33 06/15/2018 21:19 06/16/2018 08:33  Glucose-Capillary Latest Ref Range: 70 - 99 mg/dL 469172 (H) 629170 (H) 528243 (H) 213 (H) 247 (H)   Review of Glycemic Control  Diabetes history: DM2 Outpatient Diabetes medications: Glipizide 5 mg daily, Metformin 1000 mg BID  Current orders for Inpatient glycemic control: Novolog 0-15 units TID with meals  Inpatient Diabetes Program Recommendations: Insulin-Basal: Please consider ordering Lantus 5 units Q24H. Correction (SSI): Please consider increasing Novolog to resistant scale (0-20 units) and ordering Novolog 0-5 units QHS for bedtime correction scale.  NOTE: Noted finger stick glucose of 17 mg/dl at 41:3217:25 and 440222 mg/dl at 10:2717:27 on 2/53/667/29/19. Anticipate CBG of 17 mg/dl was not accurate. Fasting glucose 263 mg/dl this morning.  Thanks, Orlando PennerMarie Marilla Boddy, RN, MSN, CDE Diabetes Coordinator Inpatient Diabetes Program [phone] (Team Pager from 8am to 5pm)

## 2018-06-17 NOTE — Progress Notes (Signed)
STROKE TEAM - DAILY PROGRESS NOTE    SUBJECTIVE Patient was sitting in chair, calm during the morning rounding. Yesterday he had sundowning and required Haldol and posey restraint. Off cleviprex this am and BP stable with PO meds.   OBJECTIVE Most recent Vital Signs: Vitals:   06/17/18 0955 06/17/18 1000 06/17/18 1015 06/17/18 1030  BP: 135/66 135/67 118/60 132/71  Pulse: 66 74 73 70  Resp: 18 (!) 22 (!) 21 (!) 22  Temp:      TempSrc:      SpO2: 97% 97% 99% 97%  Weight:      Height:       CBG (last 3)  Recent Labs    06/16/18 1727 06/16/18 2123 06/17/18 0827  GLUCAP 222* 174* 263*    Physical Exam  HEENT-left occipital scalp hematoma with oozing of blood.  Normal external eye and conjunctiva.  Very poor dentition Cardiovascular- S1-S2 audible, pulses palpable throughout   Lungs-no rhonchi or wheezing noted, no excessive working breathing.  Saturations within normal limits Abdomen- All 4 quadrants palpated and nontender Musculoskeletal-no joint tenderness, deformity or swelling patient does move extremities Skin-warm and dry  Neuro:  Mental Status: Patient is awake and alert, not orientated to place, self, time, not able to name but able to repeat. Patient  follows some limited commands, but not most, able to mimic actions. Cranial Nerves: intact except poor denture, mild to moderate dysarthria Motor: he does move all extremities equally Right : Upper extremity   4/5    Left:     Upper extremity   4/5  Lower extremity   3/5     Lower extremity   3/5 Tone and bulk:normal tone throughout; no atrophy noted Sensory: Patient does withdraw to stimuli and is in arms and legs  Deep Tendon Reflexes: 1+ and symmetric throughout Plantars: Right: downgoing   Left: downgoing Cerebellar: Not cooperative but no flank ataxia Gait: Not assessed   CLINICALLY SIGNIFICANT STUDIES Basic Metabolic Panel:  Recent Labs  Lab  06/15/18 0151 06/16/18 0308 06/17/18 0341  NA 140 138 138  K 3.2* 3.4* 3.4*  CL 99 102 105  CO2 26 24 24   GLUCOSE 174* 236* 275*  BUN 25* 33* 28*  CREATININE 1.92* 1.77* 1.59*  CALCIUM 9.2 9.0 8.8*  MG 2.0  --   --    Liver Function Tests: No results for input(s): AST, ALT, ALKPHOS, BILITOT, PROT, ALBUMIN in the last 168 hours. CBC:  Recent Labs  Lab 06/12/18 1955  06/16/18 0308 06/17/18 0341  WBC 9.2   < > 12.4* 11.2*  NEUTROABS 6.7  --   --   --   HGB 12.4*   < > 12.1* 11.6*  HCT 37.9*   < > 37.0* 34.7*  MCV 92.4   < > 90.9 90.8  PLT 167   < > 160 150   < > = values in this interval not displayed.   Coagulation:  Recent Labs  Lab 06/13/18 0307  LABPROT 13.5  INR 1.04    Ct Head Wo Contrast  06/15/2018 IMPRESSION:  1. Increased size of left intraventricular hemorrhage, now measuring 6.7 x 2.8 x 3.0 cm without hydrocephalus. 2. Increase in  diffuse subarachnoid hemorrhage noted predominantly in the posterior temporal and parietal lobes, the right occipital lobe, and along the medial frontal lobes bilaterally. 3. No significant change in mass effect. 4. Stable atrophy and white matter disease. 5. Stable remote lacunar infarcts involving the basal ganglia.   Ct Head Wo Contrast 06/13/2018 IMPRESSION:  1. Slight increase in size of left temporal lobe intraparenchymal hematoma without midline shift or new mass effect.  2. Increased intraventricular blood in the left temporal horn without hydrocephalus.  3. Unchanged small volume subarachnoid hemorrhage along the right sylvian fissure.   Ct Head Wo Contrast 06/12/2018 IMPRESSION:  1. 5.5 cc LEFT temporal lobe hematoma likely posttraumatic given head injury though hypertensive etiology possible. Small volume LEFT lateral intraventricular hemorrhage. Small volume subarachnoid hemorrhage.  2. Moderate LEFT scalp hematoma.  No skull fracture.  3. Multiple old small infarcts. Mild chronic small vessel ischemic changes.    EKG  SR with 1st degree AV block   MRI Brain/MRA Head  06/13/2018 IMPRESSION: 1. Left temporal lobe hemorrhage with intraventricular extension appears stable since the CT earlier today. Intra-axial blood products estimated at 17 mL. Surrounding edema. Mild regional mass effect. 2. Small volume but more widespread bilateral subarachnoid hemorrhage than was evident by CT. Small volume intraventricular hemorrhage with no ventriculomegaly. 3. No acute infarct or new intracranial abnormality. 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 4. Motion degraded intracranial MRA with no large vessel occlusion or stenosis identified.  DG Chest Portable 1 View 06/14/2018 IMPRESSION: Low lung volumes with linear opacity at the left lung base potentially atelectasis or new consolidation. Surgical changes of median sternotomy and CABG.   Echocardiogram:                                                 06/14/2018 Study Conclusions - Left ventricle: The cavity size was normal. Systolic function was   vigorous. The estimated ejection fraction was in the range of 65%   to 70%. Wall motion was normal; there were no regional wall   motion abnormalities. There was an increased relative   contribution of atrial contraction to ventricular filling.   Doppler parameters are consistent with abnormal left ventricular   relaxation (grade 1 diastolic dysfunction). - Aortic valve: Trileaflet; moderately thickened, moderately   calcified leaflets. 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 There was mild   regurgitation. Valve area (VTI): 1.51 cm^2. Valve area (Vmax):   1.44 cm^2. Valve area (Vmean): 1.48 cm^2. - Aorta: Aortic root dimension: 37 mm (ED). - Aortic root: The aortic root was mildly dilated. - Mitral valve: There was mild regurgitation. - Left atrium: The atrium was mildly dilated. - Right atrium: The atrium was mildly dilated. - Tricuspid valve: There was mild  regurgitation. - Pulmonary arteries: PA peak pressure: 32 mm Hg (S).   B/L Carotid U/S:                                                    06/13/2018 Final Interpretation: Right Carotid: Velocities in the right ICA are consistent with a 1-39% stenosis. Left Carotid: Velocities in the left ICA are consistent with a 1-39% stenosis. Vertebrals: Bilateral vertebral arteries demonstrate antegrade flow.  ASSESSMENT/PLAN 77 year old male with history of stroke, CAD status post CABG 2003, hypertension, hyperlipidemia, diabetes and syncope admitted for head injury and ICH.  ICH:  Left PCA territory ICH with left lateral ventricle small IVH and right sylvian fissure SAH   CT of the brain left lateral intraventricular hemorrhage, small volume subarachnoid hemorrhage, 5.5 cc left temporal lobe   Repeat CT of the brain 06/13/18 -mild increase in the size of the left temporal lobe intraparenchymal hematoma, no hydrocephalus, no mass effect or midline shift  MRI of the brain -Stable Left temporal lobe hemorrhage with intraventricular extension Surrounding edema with mild regional mass effect, small volume but more widespread bilateral subarachnoid  MRA of the brain:  no large vessel occlusion or stenosis identified.  Carotid Doppler - unremarkable  2D Echocardiogram - EF 65 - 70%. No cardiac source of emboli identified.  LDL - 35  HgbA1c - 7.0  VTE heparin subq  Antiplatelets: None at this time secondary to traumatic or hypertensive intracranial hemorrhage  continue Rehab with PT consult, OT consult, Speech consult  Therapy recommendations:  CIR   Disposition:  pending  In-hospital delirium  Sundowning in ICU  Started Seroquel low-dose 12.5 twice daily  Haldol IV as needed  continue home Aricept  Secondary to baseline dementia  left scalp hematoma secondary to trauma  CT confirmed left scalp hematoma  Supportive care with dressing  Hypertension  BP stable   Off  cleviprex  BP goal <160.  On home amlodipine 10 mg, clonidine 0.3 mg tid  on hydralazine 50 every 8h  hold lisinopril and HCTZ in the setting of AKI on CKD.   Hyperlipidemia  Home meds:   Crestor 10 mg  LDL 35 goal < 70  Hold off statin at this time  AKI on CKD stage III  Creatinine 2.07->1.92->1.77->1.59, baseline 1.15  Hold all nephrotoxic medications including lisinopril for now  Renal function closely  Type 2 diabetes mellitus  Still has hyperglycemia and hypoglycemia episodes  Resume metformin and glipizide  SSI  CBG monitoring  Hemoglobin A1c 7.0  Continue carb modified diet  Leukocytosis  WBC 12-> 13.9-> 12.4->11.2  continue to monitor patient has been afebrile  Blood culture NGTD  Other Stroke Risk Factors  Advanced age  Smokeless Tobacco  Hx of CAD post CABG in 2003  Family hx stroke (Father)  Other issues  Hypokalemia - supplement - 3.2 -> 3.4->3.4  (monitor)  Anxiety depression, continue sertraline daily and Xanax as needed   Hospital day # 5  This patient is critically ill due to ICH, IVH, SAH, delirium and at significant risk of neurological worsening, death form hematoma expansion, cerebral edema and brain herniation, worsening delirium. This patient's care requires constant monitoring of vital signs, hemodynamics, respiratory and cardiac monitoring, review of multiple databases, neurological assessment, discussion with family, other specialists and medical decision making of high complexity. I spent 50 minutes of neurocritical care time in the care of this patient.  Marvel PlanJindong Jamilette Suchocki, MD PhD Stroke Neurology 06/17/2018 10:47 AM   To contact Stroke Continuity provider, please refer to [email or web address]. After hours, contact General Neurology

## 2018-06-18 ENCOUNTER — Inpatient Hospital Stay (HOSPITAL_COMMUNITY)
Admission: RE | Admit: 2018-06-18 | Discharge: 2018-07-10 | DRG: 945 | Disposition: A | Discharge status: Home Health | Payer: Medicare Other | Source: Intra-hospital | Attending: Physical Medicine & Rehabilitation | Admitting: Physical Medicine & Rehabilitation

## 2018-06-18 ENCOUNTER — Encounter (HOSPITAL_COMMUNITY): Payer: Self-pay | Admitting: Emergency Medicine

## 2018-06-18 ENCOUNTER — Other Ambulatory Visit: Payer: Self-pay

## 2018-06-18 DIAGNOSIS — E785 Hyperlipidemia, unspecified: Secondary | ICD-10-CM | POA: Diagnosis present

## 2018-06-18 DIAGNOSIS — W19XXXD Unspecified fall, subsequent encounter: Secondary | ICD-10-CM | POA: Diagnosis not present

## 2018-06-18 DIAGNOSIS — F329 Major depressive disorder, single episode, unspecified: Secondary | ICD-10-CM

## 2018-06-18 DIAGNOSIS — N400 Enlarged prostate without lower urinary tract symptoms: Secondary | ICD-10-CM | POA: Diagnosis present

## 2018-06-18 DIAGNOSIS — F32A Depression, unspecified: Secondary | ICD-10-CM

## 2018-06-18 DIAGNOSIS — I10 Essential (primary) hypertension: Secondary | ICD-10-CM | POA: Diagnosis not present

## 2018-06-18 DIAGNOSIS — Z7984 Long term (current) use of oral hypoglycemic drugs: Secondary | ICD-10-CM | POA: Diagnosis not present

## 2018-06-18 DIAGNOSIS — I619 Nontraumatic intracerebral hemorrhage, unspecified: Secondary | ICD-10-CM | POA: Diagnosis present

## 2018-06-18 DIAGNOSIS — R1312 Dysphagia, oropharyngeal phase: Secondary | ICD-10-CM | POA: Diagnosis not present

## 2018-06-18 DIAGNOSIS — M7989 Other specified soft tissue disorders: Secondary | ICD-10-CM | POA: Diagnosis not present

## 2018-06-18 DIAGNOSIS — R1310 Dysphagia, unspecified: Secondary | ICD-10-CM | POA: Diagnosis present

## 2018-06-18 DIAGNOSIS — E1165 Type 2 diabetes mellitus with hyperglycemia: Secondary | ICD-10-CM | POA: Diagnosis not present

## 2018-06-18 DIAGNOSIS — Z951 Presence of aortocoronary bypass graft: Secondary | ICD-10-CM

## 2018-06-18 DIAGNOSIS — I611 Nontraumatic intracerebral hemorrhage in hemisphere, cortical: Secondary | ICD-10-CM | POA: Diagnosis not present

## 2018-06-18 DIAGNOSIS — E1122 Type 2 diabetes mellitus with diabetic chronic kidney disease: Secondary | ICD-10-CM | POA: Diagnosis present

## 2018-06-18 DIAGNOSIS — S0101XD Laceration without foreign body of scalp, subsequent encounter: Secondary | ICD-10-CM

## 2018-06-18 DIAGNOSIS — Z8249 Family history of ischemic heart disease and other diseases of the circulatory system: Secondary | ICD-10-CM | POA: Diagnosis not present

## 2018-06-18 DIAGNOSIS — F17220 Nicotine dependence, chewing tobacco, uncomplicated: Secondary | ICD-10-CM | POA: Diagnosis present

## 2018-06-18 DIAGNOSIS — I129 Hypertensive chronic kidney disease with stage 1 through stage 4 chronic kidney disease, or unspecified chronic kidney disease: Secondary | ICD-10-CM | POA: Diagnosis present

## 2018-06-18 DIAGNOSIS — F419 Anxiety disorder, unspecified: Secondary | ICD-10-CM

## 2018-06-18 DIAGNOSIS — N183 Chronic kidney disease, stage 3 (moderate): Secondary | ICD-10-CM | POA: Diagnosis present

## 2018-06-18 DIAGNOSIS — F0390 Unspecified dementia without behavioral disturbance: Secondary | ICD-10-CM | POA: Diagnosis present

## 2018-06-18 DIAGNOSIS — S066X9D Traumatic subarachnoid hemorrhage with loss of consciousness of unspecified duration, subsequent encounter: Secondary | ICD-10-CM | POA: Diagnosis not present

## 2018-06-18 DIAGNOSIS — D62 Acute posthemorrhagic anemia: Secondary | ICD-10-CM | POA: Diagnosis present

## 2018-06-18 DIAGNOSIS — Z823 Family history of stroke: Secondary | ICD-10-CM | POA: Diagnosis not present

## 2018-06-18 DIAGNOSIS — I25810 Atherosclerosis of coronary artery bypass graft(s) without angina pectoris: Secondary | ICD-10-CM

## 2018-06-18 DIAGNOSIS — Z7982 Long term (current) use of aspirin: Secondary | ICD-10-CM

## 2018-06-18 DIAGNOSIS — S06359D Traumatic hemorrhage of left cerebrum with loss of consciousness of unspecified duration, subsequent encounter: Principal | ICD-10-CM

## 2018-06-18 DIAGNOSIS — R269 Unspecified abnormalities of gait and mobility: Secondary | ICD-10-CM | POA: Diagnosis not present

## 2018-06-18 DIAGNOSIS — Z8673 Personal history of transient ischemic attack (TIA), and cerebral infarction without residual deficits: Secondary | ICD-10-CM | POA: Diagnosis not present

## 2018-06-18 DIAGNOSIS — J189 Pneumonia, unspecified organism: Secondary | ICD-10-CM

## 2018-06-18 DIAGNOSIS — J181 Lobar pneumonia, unspecified organism: Secondary | ICD-10-CM | POA: Diagnosis not present

## 2018-06-18 DIAGNOSIS — J690 Pneumonitis due to inhalation of food and vomit: Secondary | ICD-10-CM | POA: Diagnosis not present

## 2018-06-18 DIAGNOSIS — I2510 Atherosclerotic heart disease of native coronary artery without angina pectoris: Secondary | ICD-10-CM | POA: Diagnosis present

## 2018-06-18 DIAGNOSIS — E119 Type 2 diabetes mellitus without complications: Secondary | ICD-10-CM | POA: Diagnosis not present

## 2018-06-18 DIAGNOSIS — R7989 Other specified abnormal findings of blood chemistry: Secondary | ICD-10-CM | POA: Diagnosis not present

## 2018-06-18 DIAGNOSIS — S065X9A Traumatic subdural hemorrhage with loss of consciousness of unspecified duration, initial encounter: Secondary | ICD-10-CM | POA: Diagnosis not present

## 2018-06-18 DIAGNOSIS — S062X9S Diffuse traumatic brain injury with loss of consciousness of unspecified duration, sequela: Secondary | ICD-10-CM | POA: Diagnosis not present

## 2018-06-18 DIAGNOSIS — R509 Fever, unspecified: Secondary | ICD-10-CM

## 2018-06-18 DIAGNOSIS — I610 Nontraumatic intracerebral hemorrhage in hemisphere, subcortical: Secondary | ICD-10-CM | POA: Diagnosis not present

## 2018-06-18 DIAGNOSIS — S069X2S Unspecified intracranial injury with loss of consciousness of 31 minutes to 59 minutes, sequela: Secondary | ICD-10-CM | POA: Diagnosis not present

## 2018-06-18 LAB — GLUCOSE, CAPILLARY
GLUCOSE-CAPILLARY: 209 mg/dL — AB (ref 70–99)
Glucose-Capillary: 217 mg/dL — ABNORMAL HIGH (ref 70–99)
Glucose-Capillary: 109 mg/dL — ABNORMAL HIGH (ref 70–99)

## 2018-06-18 LAB — CBC
HCT: 31.9 % — ABNORMAL LOW (ref 39.0–52.0)
Hemoglobin: 10.2 g/dL — ABNORMAL LOW (ref 13.0–17.0)
MCH: 29.8 pg (ref 26.0–34.0)
MCHC: 32 g/dL (ref 30.0–36.0)
MCV: 93.3 fL (ref 78.0–100.0)
Platelets: 127 10*3/uL — ABNORMAL LOW (ref 150–400)
RBC: 3.42 MIL/uL — ABNORMAL LOW (ref 4.22–5.81)
RDW: 13.8 % (ref 11.5–15.5)
WBC: 9.6 10*3/uL (ref 4.0–10.5)

## 2018-06-18 LAB — BASIC METABOLIC PANEL
Anion gap: 13 (ref 5–15)
BUN: 35 mg/dL — ABNORMAL HIGH (ref 8–23)
CO2: 25 mmol/L (ref 22–32)
Calcium: 8.7 mg/dL — ABNORMAL LOW (ref 8.9–10.3)
Chloride: 104 mmol/L (ref 98–111)
Creatinine, Ser: 1.65 mg/dL — ABNORMAL HIGH (ref 0.61–1.24)
GFR calc Af Amer: 44 mL/min — ABNORMAL LOW (ref >60)
GFR calc non Af Amer: 38 mL/min — ABNORMAL LOW (ref >60)
Glucose, Bld: 232 mg/dL — ABNORMAL HIGH (ref 70–99)
Potassium: 3.8 mmol/L (ref 3.5–5.1)
Sodium: 142 mmol/L (ref 135–145)

## 2018-06-18 MED ORDER — RESOURCE THICKENUP CLEAR PO POWD
ORAL | Status: DC | PRN
  Filled 2018-06-18: qty 125

## 2018-06-18 MED ORDER — SERTRALINE HCL 100 MG PO TABS
100.0000 mg | ORAL_TABLET | Freq: Every day | ORAL | Status: DC
  Administered 2018-06-19 – 2018-07-10 (×22): 100 mg via ORAL
  Filled 2018-06-18 (×22): qty 1

## 2018-06-18 MED ORDER — DONEPEZIL HCL 10 MG PO TABS
10.0000 mg | ORAL_TABLET | Freq: Every day | ORAL | Status: DC
  Administered 2018-06-18 – 2018-07-09 (×22): 10 mg via ORAL
  Filled 2018-06-18 (×21): qty 1

## 2018-06-18 MED ORDER — CLONIDINE HCL 0.3 MG PO TABS: 0.3000 mg | ORAL_TABLET | Freq: Three times a day (TID) | ORAL | 11 refills | Status: DC

## 2018-06-18 MED ORDER — PANTOPRAZOLE SODIUM 40 MG PO PACK: 40.0000 mg | PACK | Freq: Every day | ORAL | Status: DC

## 2018-06-18 MED ORDER — QUETIAPINE FUMARATE 25 MG PO TABS
12.5000 mg | ORAL_TABLET | Freq: Two times a day (BID) | ORAL | Status: DC
  Administered 2018-06-18 – 2018-07-10 (×44): 12.5 mg via ORAL
  Filled 2018-06-18 (×45): qty 1

## 2018-06-18 MED ORDER — SENNOSIDES-DOCUSATE SODIUM 8.6-50 MG PO TABS
1.0000 | ORAL_TABLET | Freq: Two times a day (BID) | ORAL | Status: DC
  Administered 2018-06-18 – 2018-07-10 (×44): 1 via ORAL
  Filled 2018-06-18 (×44): qty 1

## 2018-06-18 MED ORDER — HALOPERIDOL LACTATE 5 MG/ML IJ SOLN: 2.5000 mg | Freq: Four times a day (QID) | INTRAMUSCULAR | Status: DC | PRN

## 2018-06-18 MED ORDER — AMLODIPINE BESYLATE 10 MG PO TABS
10.0000 mg | ORAL_TABLET | Freq: Every day | ORAL | Status: DC
  Administered 2018-06-19 – 2018-07-10 (×22): 10 mg via ORAL
  Filled 2018-06-18 (×22): qty 1

## 2018-06-18 MED ORDER — INSULIN ASPART 100 UNIT/ML ~~LOC~~ SOLN
0.0000 [IU] | Freq: Three times a day (TID) | SUBCUTANEOUS | Status: DC
  Administered 2018-06-19: 3 [IU] via SUBCUTANEOUS
  Administered 2018-06-19 – 2018-06-20 (×3): 2 [IU] via SUBCUTANEOUS
  Administered 2018-06-20 – 2018-06-21 (×4): 3 [IU] via SUBCUTANEOUS
  Administered 2018-06-21: 2 [IU] via SUBCUTANEOUS
  Administered 2018-06-22: 3 [IU] via SUBCUTANEOUS
  Administered 2018-06-22: 5 [IU] via SUBCUTANEOUS
  Administered 2018-06-23 (×3): 3 [IU] via SUBCUTANEOUS
  Administered 2018-06-25: 5 [IU] via SUBCUTANEOUS
  Administered 2018-06-25: 3 [IU] via SUBCUTANEOUS
  Administered 2018-06-26 (×2): 2 [IU] via SUBCUTANEOUS
  Administered 2018-06-26: 3 [IU] via SUBCUTANEOUS
  Administered 2018-06-27: 2 [IU] via SUBCUTANEOUS
  Administered 2018-06-27: 5 [IU] via SUBCUTANEOUS
  Administered 2018-06-28 – 2018-06-30 (×5): 2 [IU] via SUBCUTANEOUS
  Administered 2018-07-05 – 2018-07-06 (×2): 3 [IU] via SUBCUTANEOUS
  Administered 2018-07-06 – 2018-07-08 (×2): 2 [IU] via SUBCUTANEOUS
  Administered 2018-07-09: 1 [IU] via SUBCUTANEOUS

## 2018-06-18 MED ORDER — HYDRALAZINE HCL 50 MG PO TABS
50.0000 mg | ORAL_TABLET | Freq: Three times a day (TID) | ORAL | Status: DC
  Administered 2018-06-18 – 2018-07-10 (×61): 50 mg via ORAL
  Filled 2018-06-18 (×64): qty 1

## 2018-06-18 MED ORDER — HEPARIN SODIUM (PORCINE) 5000 UNIT/ML IJ SOLN: 5000.0000 [IU] | Freq: Three times a day (TID) | INTRAMUSCULAR | Status: DC

## 2018-06-18 MED ORDER — QUETIAPINE FUMARATE 25 MG PO TABS
12.5000 mg | ORAL_TABLET | Freq: Two times a day (BID) | ORAL | Status: DC
Start: 2018-06-18 — End: 2018-07-10

## 2018-06-18 MED ORDER — INSULIN ASPART 100 UNIT/ML ~~LOC~~ SOLN: 0.0000 [IU] | Freq: Three times a day (TID) | SUBCUTANEOUS | 11 refills | Status: DC

## 2018-06-18 MED ORDER — FERROUS SULFATE 325 (65 FE) MG PO TABS
325.0000 mg | ORAL_TABLET | Freq: Every day | ORAL | Status: DC
  Administered 2018-06-19 – 2018-07-10 (×22): 325 mg via ORAL
  Filled 2018-06-18 (×22): qty 1

## 2018-06-18 MED ORDER — ACETAMINOPHEN 160 MG/5ML PO SOLN: 650.0000 mg | ORAL | Status: DC | PRN

## 2018-06-18 MED ORDER — HEPARIN SODIUM (PORCINE) 5000 UNIT/ML IJ SOLN
5000.0000 [IU] | Freq: Three times a day (TID) | INTRAMUSCULAR | Status: DC
  Administered 2018-06-18 – 2018-07-10 (×65): 5000 [IU] via SUBCUTANEOUS
  Filled 2018-06-18 (×65): qty 1

## 2018-06-18 MED ORDER — CLONIDINE HCL 0.2 MG PO TABS
0.3000 mg | ORAL_TABLET | Freq: Three times a day (TID) | ORAL | Status: DC
  Administered 2018-06-18 – 2018-07-10 (×61): 0.3 mg via ORAL
  Filled 2018-06-18 (×64): qty 1

## 2018-06-18 MED ORDER — PANTOPRAZOLE SODIUM 40 MG PO PACK
40.0000 mg | PACK | Freq: Every day | ORAL | Status: DC
  Administered 2018-06-19 – 2018-07-09 (×21): 40 mg
  Filled 2018-06-18 (×12): qty 20

## 2018-06-18 MED ORDER — ALPRAZOLAM 0.25 MG PO TABS
0.1250 mg | ORAL_TABLET | Freq: Every day | ORAL | Status: DC
  Administered 2018-06-18 – 2018-07-09 (×22): 0.125 mg via ORAL
  Filled 2018-06-18 (×24): qty 1

## 2018-06-18 MED ORDER — ALPRAZOLAM 0.25 MG PO TABS: 0.1250 mg | ORAL_TABLET | Freq: Every day | ORAL | 0 refills | Status: DC

## 2018-06-18 MED ORDER — ACETAMINOPHEN 650 MG RE SUPP: 650.0000 mg | RECTAL | Status: DC | PRN

## 2018-06-18 MED ORDER — RESOURCE THICKENUP CLEAR PO POWD: 1.0000 | ORAL | Status: DC | PRN

## 2018-06-18 MED ORDER — ACETAMINOPHEN 325 MG PO TABS
650.0000 mg | ORAL_TABLET | ORAL | Status: DC | PRN
  Administered 2018-06-23: 650 mg via ORAL
  Filled 2018-06-18: qty 2

## 2018-06-18 MED ORDER — GLIPIZIDE 5 MG PO TABS
5.0000 mg | ORAL_TABLET | Freq: Every day | ORAL | Status: DC
  Administered 2018-06-19 – 2018-06-23 (×5): 5 mg via ORAL
  Filled 2018-06-18 (×5): qty 1

## 2018-06-18 MED ORDER — TAMSULOSIN HCL 0.4 MG PO CAPS
0.8000 mg | ORAL_CAPSULE | Freq: Every day | ORAL | Status: DC
  Administered 2018-06-19 – 2018-07-10 (×22): 0.8 mg via ORAL
  Filled 2018-06-18 (×21): qty 2

## 2018-06-18 MED ORDER — HYDRALAZINE HCL 50 MG PO TABS: 50.0000 mg | ORAL_TABLET | Freq: Three times a day (TID) | ORAL | Status: DC

## 2018-06-18 MED ORDER — SENNOSIDES-DOCUSATE SODIUM 8.6-50 MG PO TABS: 1.0000 | ORAL_TABLET | Freq: Two times a day (BID) | ORAL | Status: AC

## 2018-06-18 MED ORDER — METFORMIN HCL 500 MG PO TABS
1000.0000 mg | ORAL_TABLET | Freq: Two times a day (BID) | ORAL | Status: DC
  Administered 2018-06-19 – 2018-07-10 (×39): 1000 mg via ORAL
  Filled 2018-06-18 (×41): qty 2

## 2018-06-18 NOTE — Care Management Important Message (Signed)
Important Message  Patient Details  Name: Mark Montoya MRN: [id number] Date of Birth: [birth year]   Medicare Important Message Given:  Yes    [name] 06/18/2018, 4:31 PM

## 2018-06-18 NOTE — Progress Notes (Signed)
Inpatient Rehabilitation Admissions Coordinator  I met with patient at bedside with his brother. Pt alert and interactive. I have insurance approval to admit pt to inpt rehab today. I contacted his grandson, Joshua/POA, by phone and he is in agreement. I contacted Burnetta Sabin, GNP, RN CM, Vida Roller, and SW, Kathlee Nations. I will make the arrangements to admit today.  Danne Baxter, RN, MSN Rehab Admissions Coordinator [phone] 06/18/2018 9:25 AM

## 2018-06-18 NOTE — H&P (Signed)
Physical Medicine and Rehabilitation Admission H&P    Chief Complaint  Patient presents with  . Head Injury  : HPI: Mark Montoya. Mark Montoya is a 77 year old right-handed male with history of dementia maintained on Aricept, CVA maintained on aspirin, hypertension, diabetes mellitus, CKD stage III, CAD with CABG 2003.  Per chart review, granddaughter, brother patient lives with granddaughter, cousin and his cousin's wife.  Independent driving prior to admission.  Family can provide assistance as needed on discharge.  Presented 06/13/2018 to [facility] after a fall with laceration left parietal scalp.  Patient cannot recall full events of the fall.  Patient noted to be hypertensive. Cranial CT scan reviewed, showing left temporal hemorrhage. Per report, 5.5 cc left temporal lobe hematoma.  Small volume left lateral intraventricular hemorrhage.  Moderate left scalp hematoma without skull fracture.  Multiple old small infarcts.  MRI of the brain again demonstrated left temporal lobe hemorrhage with surrounding edema as well as mild regional mass-effect.  MRA with no large vessel occlusion or stenosis.  Echocardiogram with ejection fraction of 70% no wall motion abnormalities.  Patient also underwent repair of scalp laceration.  Latest fall cranial CT scan 06/15/2018 showing increased size of left ventricular hemorrhage measuring 6.7 x 2.8 x 3.0 cm without hydrocephalus.  Increase in diffuse subarachnoid hemorrhage noted predominantly in the posterior temporal parietal lobes, the right occipital lobe and along the medial frontal lobes bilaterally.  No significant change in mass-effect and continue to monitor.  Subcutaneous heparin initiated for DVT prophylaxis 06/17/2018 currently on a dysphagia #1 honey thick liquid diet.  Physical and occupational therapy ongoing with recommendations of physical medicine rehab consult.  Patient was admitted for a comprehensive rehab program.  Review of Systems  Unable to  perform ROS: Medical condition   Past Medical History:  Diagnosis Date  . Abnormal echocardiogram    NUCLEAR STRESS TEST, 01/28/2007 - EKG negative for ischemia, no significant ischemia demonstrated  . Coronary artery disease 02/03/02   status post CABG by Dr. Merilynn Finland. LIMA-LAD, seq SVG-D1-D2, SVG-OM, SVG-PDA.  Marland Kitchen Hyperlipidemia   . Hypertension   . Stroke ([place])   . Structural heart disease    2D ECHO, 01/08/2005 - normal-mild  . Syncope    CAROTID DOPPLER, 01/08/2005 - No significant carotid vadcular disease  . Type 2 diabetes mellitus ([place])    Past Surgical History:  Procedure Laterality Date  . CARDIAC CATHETERIZATION  01/22/2002   CABG recommended  . CORONARY ARTERY BYPASS GRAFT  02/03/2002   x5; LIMA-LAD, SVG-D1 and D2, SVG-OM1, and SVG-PDA  . ORIF TIBIA PLATEAU Right 11/02/2013   Procedure: OPEN REDUCTION INTERNAL FIXATION (ORIF) RIGHT TIBIAL PLATEAU;  Surgeon: Rozanna Box, MD;  Location: [place];  Service: Orthopedics;  Laterality: Right;   Family History  Problem Relation Age of Onset  . Heart disease Mother   . Heart failure Mother   . Heart disease Father   . CVA Father   . Cancer Brother   . Cancer Brother   . Cancer Brother   . Hypertension Daughter   . Heart disease Daughter   . Diabetes Daughter   . Multiple sclerosis Daughter   . Lupus Daughter   . Heart disease Brother        CABG  . Heart disease Brother        CABG  . Heart disease Brother        CABG   Social History:  reports that he has never smoked. His  smokeless tobacco use includes chew. He reports that he does not drink alcohol or use drugs. Allergies:  Allergies  Allergen Reactions  . Lipitor [Atorvastatin Calcium]   . Other     provastatin  . Simvastatin    Medications Prior to Admission  Medication Sig Dispense Refill  . ALPRAZolam (XANAX) 0.5 MG tablet Take 0.5-1 mg by mouth 2 (two) times daily. Take 1 tablet in the morning and 2 tablets in the evening.    Marland Kitchen amLODipine  (NORVASC) 10 MG tablet Take 10 mg by mouth daily.    Marland Kitchen aspirin 81 MG tablet Take 81 mg by mouth daily.    . cloNIDine (CATAPRES) 0.1 MG tablet Take 0.3 mg by mouth 2 (two) times daily. 1 tablet in the morning and two tablets in the evening.     Marland Kitchen CRESTOR 10 MG tablet Take 10 mg by mouth daily.    . diphenhydrAMINE (BENADRYL) 25 mg capsule Take 50 mg by mouth at bedtime.    . donepezil (ARICEPT) 10 MG tablet Take 1 tablet by mouth at bedtime.    . ferrous sulfate 325 (65 FE) MG tablet Take 325 mg by mouth daily with breakfast.    . glipiZIDE (GLUCOTROL) 5 MG tablet Take 1 tablet (5 mg total) by mouth daily before breakfast. 30 tablet 0  . hydrochlorothiazide (HYDRODIURIL) 12.5 MG tablet Take 12.5 mg by mouth daily.    Marland Kitchen lisinopril (PRINIVIL,ZESTRIL) 40 MG tablet Take 20 mg by mouth daily.     . metFORMIN (GLUCOPHAGE) 500 MG tablet Take 1,000 mg by mouth 2 (two) times daily with a meal.     . Multiple Vitamin (MULTIVITAMIN) tablet Take 1 tablet by mouth daily.    . nitroGLYCERIN (NITROSTAT) 0.4 MG SL tablet     . potassium chloride SA (K-DUR,KLOR-CON) 20 MEQ tablet Take 20 mEq by mouth daily.    . sertraline (ZOLOFT) 100 MG tablet Take 100 mg by mouth daily.    . tamsulosin (FLOMAX) 0.4 MG CAPS capsule Take 2 capsules (0.8 mg total) by mouth daily after breakfast.    . vitamin C (ASCORBIC ACID) 500 MG tablet Take 500 mg by mouth daily.    . Cyanocobalamin (B-12) 1000 MCG/ML KIT Inject 1 mL as directed every 30 (thirty) days.      Drug Regimen Review Drug regimen was reviewed and remains appropriate with no significant issues identified  Home: Home Living Family/patient expects to be discharged to:: Private residence(unless otherwise recomended) Living Arrangements: (Grandson and his wife live with patient) Available Help at Discharge: Family, Available 24 hours/day Type of Home: House Home Access: Stairs to enter [employer] of Steps: 2 Entrance Stairs-Rails: None Home  Layout: One level Bathroom Shower/Tub: Tub/shower unit, Architectural technologist: Programmer, systems: Yes Home Equipment: None(per daughter, pt refuses AD)  Lives With: Family   Functional History: Prior Function Level of Independence: Independent  Functional Status:  Mobility: Bed Mobility Overal bed mobility: Needs Assistance Bed Mobility: Sit to Supine Supine to sit: Min assist Sit to supine: Mod assist General bed mobility comments: pt needs (A) to sequence and elevate BIL Le onto bed surface Transfers Overall transfer level: Needs assistance Equipment used: Rolling walker (2 wheeled) Transfers: Stand Pivot Transfers Sit to Stand: Mod assist, +2 physical assistance Stand pivot transfers: +2 physical assistance, Mod assist General transfer comment: pt needs (A) to shift weight and to initiate task. pt with delayed response Ambulation/Gait Ambulation/Gait assistance: Mod assist, +2 physical assistance, +2 safety/equipment Gait Distance (  Feet): 75 Feet Assistive device: Rolling walker (2 wheeled) Gait Pattern/deviations: Step-to pattern, Step-through pattern, Decreased stride length, Shuffle, Trunk flexed, Drifts right/left General Gait Details: very unsteady gait pattern; max cueing throughout session for safety and stability; Mod A for physical assist for forward progression adn RW management as patient unaware of surroundings.  Gait velocity: decreased Gait velocity interpretation: <1.31 ft/sec, indicative of household ambulator    ADL: ADL Overall ADL's : Needs assistance/impaired Eating/Feeding: Set up, Supervision/ safety, Sitting Grooming: Set up, Supervision/safety, Sitting Upper Body Bathing: Minimal assistance, Sitting Lower Body Bathing: Moderate assistance, Sit to/from stand Upper Body Dressing : Minimal assistance, Sitting Lower Body Dressing: Minimal assistance, Sit to/from stand Lower Body Dressing Details (indicate cue type and reason): Pt  able to don socks in sitting with min assist. Would need additional assist in standing for other clothing Toilet Transfer: +2 for physical assistance, Moderate assistance, Stand-pivot Toilet Transfer Details (indicate cue type and reason): simulated chair to bed Functional mobility during ADLs: Moderate assistance, +2 for physical assistance General ADL Comments: pt with chair alarm soundin gon arrival. pt noted to void bladder and attempting to scoot out of the chair. pt repositioned in chair and then transfered to the bed . pt less restless once supine and changed position.   Cognition: Cognition Overall Cognitive Status: Impaired/Different from baseline Arousal/Alertness: Lethargic Orientation Level: Oriented to person, Disoriented to place, Disoriented to time, Disoriented to situation Attention: Focused Focused Attention: Impaired Focused Attention Impairment: Verbal basic, Functional basic Cognition Arousal/Alertness: Lethargic Behavior During Therapy: Flat affect Overall Cognitive Status: Impaired/Different from baseline Area of Impairment: Orientation, Following commands, Safety/judgement Orientation Level: Disoriented to, Person, Place, Time, Situation Current Attention Level: Focused Memory: Decreased recall of precautions, Decreased short-term memory Following Commands: Follows one step commands inconsistently Safety/Judgement: Decreased awareness of safety Awareness: Intellectual Problem Solving: Difficulty sequencing, Slow processing, Requires verbal cues, Requires tactile cues, Decreased initiation General Comments: pt opening eyes and responding to name/ pt unable to report location. pt reports "going down town"  Physical Exam: Blood pressure (!) 155/84, pulse 67, temperature 97.7 F (36.5 C), temperature source Oral, resp. rate 17, height _0  (1.676 m), weight 173 lb 1 oz (78.5 kg), SpO2 97 %. Physical Exam  Vitals reviewed. Constitutional: He appears well-developed  and well-nourished.  HENT:  Poor dentition.  Healing laceration to scalp  Eyes: EOM are normal. Right eye exhibits no discharge. Left eye exhibits no discharge.  Pupils reactive to light  Neck: Normal range of motion. Neck supple. No thyromegaly present.  Cardiovascular: Normal rate, regular rhythm and normal heart sounds.  Respiratory: Effort normal and breath sounds normal. No respiratory distress.  GI: Soft. Bowel sounds are normal. He exhibits no distension.  Musculoskeletal:  No edema or tenderness in extremities  Neurological: He is alert.  Makes good eye contact.   Follows commands inconsistently Global aphasia Oriented to name only Moving b/l UE spontaneously RLE hyperrefexia  Skin: Skin is warm and dry.  Psychiatric:  Unable to assess due to aphasia    Results for orders placed or performed during the hospital encounter of 06/12/18 (from the past 48 hour(s))  Glucose, capillary     Status: Abnormal   Collection Time: 06/16/18  5:25 PM  Result Value Ref Range   Glucose-Capillary 17 (LL) 70 - 99 mg/dL   Comment 1 Repeat Test   Glucose, capillary     Status: Abnormal   Collection Time: 06/16/18  5:27 PM  Result Value Ref Range   Glucose-Capillary  222 (H) 70 - 99 mg/dL   Comment 1 Notify RN    Comment 2 Document in Chart   Glucose, capillary     Status: Abnormal   Collection Time: 06/16/18  9:23 PM  Result Value Ref Range   Glucose-Capillary 174 (H) 70 - 99 mg/dL  CBC     Status: Abnormal   Collection Time: 06/17/18  3:41 AM  Result Value Ref Range   WBC 11.2 (H) 4.0 - 10.5 K/uL   RBC 3.82 (L) 4.22 - 5.81 MIL/uL   Hemoglobin 11.6 (L) 13.0 - 17.0 g/dL   HCT 34.7 (L) 39.0 - 52.0 %   MCV 90.8 78.0 - 100.0 fL   MCH 30.4 26.0 - 34.0 pg   MCHC 33.4 30.0 - 36.0 g/dL   RDW 13.2 11.5 - 15.5 %   Platelets 150 150 - 400 K/uL    Comment: Performed at [facility], [place] [address] [place], [place] [address]  Basic metabolic panel     Status: Abnormal   Collection  Time: 06/17/18  3:41 AM  Result Value Ref Range   Sodium 138 135 - 145 mmol/L   Potassium 3.4 (L) 3.5 - 5.1 mmol/L   Chloride 105 98 - 111 mmol/L   CO2 24 22 - 32 mmol/L   Glucose, Bld 275 (H) 70 - 99 mg/dL   BUN 28 (H) 8 - 23 mg/dL   Creatinine, Ser 1.59 (H) 0.61 - 1.24 mg/dL   Calcium 8.8 (L) 8.9 - 10.3 mg/dL   GFR calc non Af Amer 40 (L) >60 mL/min   GFR calc Af Amer 46 (L) >60 mL/min    Comment: (NOTE) The eGFR has been calculated using the CKD EPI equation. This calculation has not been validated in all clinical situations. eGFR's persistently <60 mL/min signify possible Chronic Kidney Disease.    Anion gap 9 5 - 15    Comment: Performed at [facility] [address], [place], [place] [address]  Glucose, capillary     Status: Abnormal   Collection Time: 06/17/18  8:27 AM  Result Value Ref Range   Glucose-Capillary 263 (H) 70 - 99 mg/dL   Comment 1 Notify RN    Comment 2 Document in Chart   Glucose, capillary     Status: Abnormal   Collection Time: 06/17/18 11:43 AM  Result Value Ref Range   Glucose-Capillary 177 (H) 70 - 99 mg/dL   Comment 1 Notify RN    Comment 2 Document in Chart   Glucose, capillary     Status: Abnormal   Collection Time: 06/17/18  4:56 PM  Result Value Ref Range   Glucose-Capillary 250 (H) 70 - 99 mg/dL   Comment 1 Notify RN    Comment 2 Document in Chart   Glucose, capillary     Status: Abnormal   Collection Time: 06/17/18  9:34 PM  Result Value Ref Range   Glucose-Capillary 248 (H) 70 - 99 mg/dL  CBC     Status: Abnormal   Collection Time: 06/18/18  4:29 AM  Result Value Ref Range   WBC 9.6 4.0 - 10.5 K/uL   RBC 3.42 (L) 4.22 - 5.81 MIL/uL   Hemoglobin 10.2 (L) 13.0 - 17.0 g/dL   HCT 31.9 (L) 39.0 - 52.0 %   MCV 93.3 78.0 - 100.0 fL   MCH 29.8 26.0 - 34.0 pg   MCHC 32.0 30.0 - 36.0 g/dL   RDW 13.8 11.5 - 15.5 %  Platelets 127 (L) 150 - 400 K/uL    Comment: Performed at [facility], [place] [address] [place], [place]  [address]  Basic metabolic panel     Status: Abnormal   Collection Time: 06/18/18  4:29 AM  Result Value Ref Range   Sodium 142 135 - 145 mmol/L   Potassium 3.8 3.5 - 5.1 mmol/L   Chloride 104 98 - 111 mmol/L   CO2 25 22 - 32 mmol/L   Glucose, Bld 232 (H) 70 - 99 mg/dL   BUN 35 (H) 8 - 23 mg/dL   Creatinine, Ser 1.65 (H) 0.61 - 1.24 mg/dL   Calcium 8.7 (L) 8.9 - 10.3 mg/dL   GFR calc non Af Amer 38 (L) >60 mL/min   GFR calc Af Amer 44 (L) >60 mL/min    Comment: (NOTE) The eGFR has been calculated using the CKD EPI equation. This calculation has not been validated in all clinical situations. eGFR's persistently <60 mL/min signify possible Chronic Kidney Disease.    Anion gap 13 5 - 15    Comment: Performed at [facility] [address] [place], [place] [address]  Glucose, capillary     Status: Abnormal   Collection Time: 06/18/18  6:42 AM  Result Value Ref Range   Glucose-Capillary 217 (H) 70 - 99 mg/dL   Comment 1 Notify RN    Comment 2 Document in Chart   Glucose, capillary     Status: Abnormal   Collection Time: 06/18/18 11:42 AM  Result Value Ref Range   Glucose-Capillary 209 (H) 70 - 99 mg/dL   Comment 1 Notify RN    Comment 2 Document in Chart    No results found.     Medical Problem List and Plan: 1.  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  DVT Prophylaxis/Anticoagulation: Subcutaneous heparin initiated 06/17/2018.  Monitor for any bleeding episodes 3. Pain Management: Tylenol as needed 4. Mood: Aricept 10 mg nightly, Xanax 0.125 mg nightly, Zoloft 100 mg daily, Seroquel 12.5 mg twice daily 5. Neuropsych: This patient is not capable of making decisions on his own behalf. 6. Skin/Wound Care: Routine skin checks 7. Fluids/Electrolytes/Nutrition: Routine in and outs with follow-up chemistries 8.  Dysphasia.  Dysphasia #1 honey thick liquid.  Follow-up speech therapy 9.  Hypertension.  Clonidine 0.3 mg TID,  hydralazine 50 mg every 8 hours, Norvasc 10 mg daily.  Monitor with increased mobility 10.  Diabetes mellitus.  Hemoglobin A1c 7.0.  Glucotrol 5 mg daily, Glucophage 1000 mg twice daily.  Check blood sugars before meals and at bedtime 11.  CKD stage III.  Creatinine baseline 2.07. 12.  BPH.  Flomax 0.8 mg daily.  Check PVR x3 13.  CAD with CABG 2003.  Aspirin currently on hold due to left temporal lobe hemorrhage.  No chest pain or shortness of breath  Post Admission Physician Evaluation: 1. Preadmission assessment reviewed and changes made below. 2. Functional deficits secondary  to left temporal lobe hemorrhage. 3. Patient is admitted to receive collaborative, interdisciplinary care between the physiatrist, rehab nursing staff, and therapy team. 4. Patient's level of medical complexity and substantial therapy needs in context of that medical necessity cannot be provided at a lesser intensity of care such as a SNF. 5. Patient has experienced substantial functional loss from his/her baseline which was documented above under the "Functional History" and "Functional Status" headings.  Judging by the patient's diagnosis, physical exam, and functional history, the patient  has potential for functional progress which will result in measurable gains while on inpatient rehab.  These gains will be of substantial and practical use upon discharge  in facilitating mobility and self-care at the household level. 57. Physiatrist will provide 24 hour management of medical needs as well as oversight of the therapy plan/treatment and provide guidance as appropriate regarding the interaction of the two. 7. 24 hour rehab nursing will assist with bladder management, bowel management, safety, skin/wound care, disease management, medication administration and patient education  and help integrate therapy concepts, techniques,education, etc. 8. PT will assess and treat for/with: Lower extremity strength, range of motion,  stamina, balance, functional mobility, safety, adaptive techniques and equipment, wound care, coping skills, pain control, TBI education. Goals are: Supervision/Min A. 9. OT will assess and treat for/with: ADL's, functional mobility, safety, upper extremity strength, adaptive techniques and equipment, wound mgt, ego support, and community reintegration.   Goals are: Supervision/Min A. Therapy may proceed with showering this patient. 10. SLP will assess and treat for/with: speech, language, cognition, swallowing.  Goals are: Supervision/Min A. 11. Case Management and Social Worker will assess and treat for psychological issues and discharge planning. 12. Team conference will be held weekly to assess progress toward goals and to determine barriers to discharge. 13. Patient will receive at least 3 hours of therapy per day at least 5 days per week. 14. ELOS: 20-24 days.       15. Prognosis:  good  I have personally performed a face to face diagnostic evaluation, including, but not limited to relevant history and physical exam findings, of this patient and developed relevant assessment and plan.  Additionally, I have reviewed and concur with the physician assistant's documentation above.  Delice Lesch, MD, ABPMR Lavon Paganini Angiulli, PA-C 06/18/2018

## 2018-06-18 NOTE — Progress Notes (Signed)
Physical Medicine and Rehabilitation Consult Reason for Consult: Decreased functional mobility Referring Physician: Dr.Xu   HPI: Mark Montoya is a 77 y.o. right-handed male with history of dementia maintained on Aricept, CVA maintained on aspirin, hypertension, diabetes mellitus, CKD stage III, CAD with CABG 2003.  History taken from chart review. Presented 06/13/2018 to [facility] after suspected fall with laceration left parietal scalp.  Patient could not recall full events of the fall.  Patient was noted to be hypertensive.  Cranial CT scan reviewed, showing left temporal hemorrhage. Per report, 5.5 cc left temporal lobe hematoma.  Small volume left lateral intraventricular hemorrhage.  Moderate left scalp hematoma without skull fracture.  Multiple old small infarcts.  MRI of the brain again demonstrated left temporal lobe hemorrhage with some surrounding edema as well as mild regional mass-effect.  MRA with no large vessel occlusion or stenosis.  Echocardiogram with ejection fraction of 70% no wall motion abnormalities.  Patient underwent repair of scalp laceration.  Currently on a dysphagia #1 honey thick liquid diet.  Physical and occupational therapy evaluations completed with recommendations of physical medicine rehab consult   Review of Systems  Unable to perform ROS: Mental acuity       Past Medical History:  Diagnosis Date  . Abnormal echocardiogram    NUCLEAR STRESS TEST, 01/28/2007 - EKG negative for ischemia, no significant ischemia demonstrated  . Coronary artery disease 02/03/02   status post CABG by Dr. Merilynn Finland. LIMA-LAD, seq SVG-D1-D2, SVG-OM, SVG-PDA.  Marland Kitchen Hyperlipidemia   . Hypertension   . Stroke ([place])   . Structural heart disease    2D ECHO, 01/08/2005 - normal-mild  . Syncope    CAROTID DOPPLER, 01/08/2005 - No significant carotid vadcular disease  . Type 2 diabetes mellitus ([place])         Past Surgical History:  Procedure  Laterality Date  . CARDIAC CATHETERIZATION  01/22/2002   CABG recommended  . CORONARY ARTERY BYPASS GRAFT  02/03/2002   x5; LIMA-LAD, SVG-D1 and D2, SVG-OM1, and SVG-PDA  . ORIF TIBIA PLATEAU Right 11/02/2013   Procedure: OPEN REDUCTION INTERNAL FIXATION (ORIF) RIGHT TIBIAL PLATEAU;  Surgeon: Rozanna Box, MD;  Location: [place];  Service: Orthopedics;  Laterality: Right;        Family History  Problem Relation Age of Onset  . Heart disease Mother   . Heart failure Mother   . Heart disease Father   . CVA Father   . Cancer Brother   . Cancer Brother   . Cancer Brother   . Hypertension Daughter   . Heart disease Daughter   . Diabetes Daughter   . Multiple sclerosis Daughter   . Lupus Daughter   . Heart disease Brother        CABG  . Heart disease Brother        CABG  . Heart disease Brother        CABG   Social History:  reports that he has never smoked. His smokeless tobacco use includes chew. He reports that he does not drink alcohol or use drugs. Allergies:       Allergies  Allergen Reactions  . Lipitor [Atorvastatin Calcium]   . Other     provastatin  . Simvastatin          Medications Prior to Admission  Medication Sig Dispense Refill  . ALPRAZolam (XANAX) 0.5 MG tablet Take 0.5-1 mg by mouth 2 (two) times daily. Take 1 tablet in  the morning and 2 tablets in the evening.    Marland Kitchen amLODipine (NORVASC) 10 MG tablet Take 10 mg by mouth daily.    Marland Kitchen aspirin 81 MG tablet Take 81 mg by mouth daily.    . cloNIDine (CATAPRES) 0.1 MG tablet Take 0.3 mg by mouth 2 (two) times daily. 1 tablet in the morning and two tablets in the evening.     Marland Kitchen CRESTOR 10 MG tablet Take 10 mg by mouth daily.    . diphenhydrAMINE (BENADRYL) 25 mg capsule Take 50 mg by mouth at bedtime.    . donepezil (ARICEPT) 10 MG tablet Take 1 tablet by mouth at bedtime.    . ferrous sulfate 325 (65 FE) MG tablet Take 325 mg by mouth daily with breakfast.    .  glipiZIDE (GLUCOTROL) 5 MG tablet Take 1 tablet (5 mg total) by mouth daily before breakfast. 30 tablet 0  . hydrochlorothiazide (HYDRODIURIL) 12.5 MG tablet Take 12.5 mg by mouth daily.    Marland Kitchen lisinopril (PRINIVIL,ZESTRIL) 40 MG tablet Take 20 mg by mouth daily.     . metFORMIN (GLUCOPHAGE) 500 MG tablet Take 1,000 mg by mouth 2 (two) times daily with a meal.     . Multiple Vitamin (MULTIVITAMIN) tablet Take 1 tablet by mouth daily.    . nitroGLYCERIN (NITROSTAT) 0.4 MG SL tablet     . potassium chloride SA (K-DUR,KLOR-CON) 20 MEQ tablet Take 20 mEq by mouth daily.    . sertraline (ZOLOFT) 100 MG tablet Take 100 mg by mouth daily.    . tamsulosin (FLOMAX) 0.4 MG CAPS capsule Take 2 capsules (0.8 mg total) by mouth daily after breakfast.    . vitamin C (ASCORBIC ACID) 500 MG tablet Take 500 mg by mouth daily.    . Cyanocobalamin (B-12) 1000 MCG/ML KIT Inject 1 mL as directed every 30 (thirty) days.      Home: Home Living Family/patient expects to be discharged to:: Private residence(unless otherwise recomended) Living Arrangements: Other relatives Type of Home: House Home Access: Stairs to enter Technical brewer of Steps: 2 Entrance Stairs-Rails: None Home Layout: One level Bathroom Shower/Tub: Chiropodist: [place]: None(per daughter, pt refuses AD)  Functional History: Prior Function Level of Independence: Independent Functional Status:  Mobility: Bed Mobility Overal bed mobility: Needs Assistance Bed Mobility: Supine to Sit Supine to sit: Min assist General bed mobility comments: min assist to come to EOB, increased time and effort to perform. Cues for initiation of LE movement to EOB Transfers Overall transfer level: Needs assistance Equipment used: 2 person hand held assist Transfers: Sit to/from Stand Sit to Stand: +2 physical assistance, Mod assist General transfer comment: Moderate assist to power up to  standing with bilateral UE support. Some posterior lean upon initial elevation to upright Ambulation/Gait Ambulation/Gait assistance: Mod assist, +2 physical assistance Gait Distance (Feet): 24 Feet Assistive device: 2 person hand held assist Gait Pattern/deviations: Step-to pattern, Decreased stride length, Shuffle, Scissoring, Drifts right/left, Trunk flexed General Gait Details: patient requiring increased assist to maintain upright, manual facilitation of pacing and VCs for increased stride. Flexed posture noted with decreased coordination at this time. Gait velocity: decreased Gait velocity interpretation: <1.31 ft/sec, indicative of household ambulator  ADL: ADL Overall ADL's : Needs assistance/impaired Eating/Feeding: Set up, Supervision/ safety, Sitting Grooming: Set up, Supervision/safety, Sitting Upper Body Bathing: Minimal assistance, Sitting Lower Body Bathing: Moderate assistance, Sit to/from stand Upper Body Dressing : Minimal assistance, Sitting Lower Body Dressing: Minimal assistance, Sit to/from stand  Lower Body Dressing Details (indicate cue type and reason): Pt able to don socks in sitting with min assist. Would need additional assist in standing for other clothing Toilet Transfer: Moderate assistance, +2 for physical assistance, Ambulation Toilet Transfer Details (indicate cue type and reason): Simulated by sit to stand from EOB with functional mobility in room Functional mobility during ADLs: Moderate assistance, +2 for physical assistance  Cognition: Cognition Overall Cognitive Status: Impaired/Different from baseline Arousal/Alertness: Lethargic Orientation Level: Oriented to person, Disoriented to place, Disoriented to time, Disoriented to situation Attention: Focused Focused Attention: Impaired Focused Attention Impairment: Verbal basic, Functional basic Cognition Arousal/Alertness: Awake/alert Behavior During Therapy: WFL for tasks  assessed/performed Overall Cognitive Status: Impaired/Different from baseline Area of Impairment: Orientation, Attention, Memory, Following commands, Safety/judgement, Awareness, Problem solving Orientation Level: Disoriented to, Place, Time, Situation Current Attention Level: Sustained Memory: Decreased recall of precautions, Decreased short-term memory Following Commands: Follows one step commands with increased time, Follows multi-step commands inconsistently Safety/Judgement: Decreased awareness of safety, Decreased awareness of deficits Awareness: Intellectual Problem Solving: Difficulty sequencing, Slow processing, Requires verbal cues, Requires tactile cues  Blood pressure (!) 147/75, pulse 72, temperature 98.2 F (36.8 C), temperature source Axillary, resp. rate 15, height '5\' 6"'  (1.676 m), weight 78.5 kg (173 lb 1 oz), SpO2 96 %. Physical Exam  Vitals reviewed. Constitutional: He appears well-developed and well-nourished.  HENT:  Poor dentition.  Healing scalp laceration  Eyes: EOM are normal. Right eye exhibits no discharge. Left eye exhibits no discharge.  Neck: Normal range of motion. Neck supple. No thyromegaly present.  Cardiovascular: Normal rate, regular rhythm and normal heart sounds.  Respiratory: Effort normal and breath sounds normal. No respiratory distress.  GI: Soft. Bowel sounds are normal. He exhibits no distension.  Musculoskeletal:  No edema or tenderness in extremities  Neurological: He is alert.  Oriented to name only.  Motor (limited by participation): ?RUE/RLE: 4/5 proximal to distal ?LUE/LLE: 4+/5 proximal to distal RLE hyperrefexia  Skin:  See above  Psychiatric: His affect is blunt. His speech is delayed, tangential and slurred. He is slowed. He exhibits abnormal recent memory and abnormal remote memory.    LabResultsLast24Hours       Results for orders placed or performed during the hospital encounter of 06/12/18 (from the past 24 hour(s))   Glucose, capillary     Status: Abnormal   Collection Time: 06/15/18  7:27 AM  Result Value Ref Range   Glucose-Capillary 172 (H) 70 - 99 mg/dL  Glucose, capillary     Status: Abnormal   Collection Time: 06/15/18  1:21 PM  Result Value Ref Range   Glucose-Capillary 170 (H) 70 - 99 mg/dL  Urinalysis, Complete w Microscopic     Status: Abnormal   Collection Time: 06/15/18  2:30 PM  Result Value Ref Range   Color, Urine YELLOW YELLOW   APPearance CLOUDY (A) CLEAR   Specific Gravity, Urine 1.015 1.005 - 1.030   pH 5.0 5.0 - 8.0   Glucose, UA 50 (A) NEGATIVE mg/dL   Hgb urine dipstick NEGATIVE NEGATIVE   Bilirubin Urine NEGATIVE NEGATIVE   Ketones, ur 5 (A) NEGATIVE mg/dL   Protein, ur NEGATIVE NEGATIVE mg/dL   Nitrite NEGATIVE NEGATIVE   Leukocytes, UA NEGATIVE NEGATIVE   RBC / HPF 0-5 0 - 5 RBC/hpf   WBC, UA 0-5 0 - 5 WBC/hpf   Bacteria, UA RARE (A) NONE SEEN   Squamous Epithelial / LPF 0-5 0 - 5   Mucus PRESENT    Hyaline Casts,  UA PRESENT   Glucose, capillary     Status: Abnormal   Collection Time: 06/15/18  4:33 PM  Result Value Ref Range   Glucose-Capillary 243 (H) 70 - 99 mg/dL  Glucose, capillary     Status: Abnormal   Collection Time: 06/15/18  9:19 PM  Result Value Ref Range   Glucose-Capillary 213 (H) 70 - 99 mg/dL  CBC     Status: Abnormal   Collection Time: 06/16/18  3:08 AM  Result Value Ref Range   WBC 12.4 (H) 4.0 - 10.5 K/uL   RBC 4.07 (L) 4.22 - 5.81 MIL/uL   Hemoglobin 12.1 (L) 13.0 - 17.0 g/dL   HCT 37.0 (L) 39.0 - 52.0 %   MCV 90.9 78.0 - 100.0 fL   MCH 29.7 26.0 - 34.0 pg   MCHC 32.7 30.0 - 36.0 g/dL   RDW 13.2 11.5 - 15.5 %   Platelets 160 150 - 400 K/uL  Basic metabolic panel     Status: Abnormal   Collection Time: 06/16/18  3:08 AM  Result Value Ref Range   Sodium 138 135 - 145 mmol/L   Potassium 3.4 (L) 3.5 - 5.1 mmol/L   Chloride 102 98 - 111 mmol/L   CO2 24 22 - 32 mmol/L   Glucose, Bld 236 (H)  70 - 99 mg/dL   BUN 33 (H) 8 - 23 mg/dL   Creatinine, Ser 1.77 (H) 0.61 - 1.24 mg/dL   Calcium 9.0 8.9 - 10.3 mg/dL   GFR calc non Af Amer 35 (L) >60 mL/min   GFR calc Af Amer 41 (L) >60 mL/min   Anion gap 12 5 - 15      ImagingResults(Last48hours)  Ct Head Wo Contrast  Result Date: 06/15/2018 CLINICAL DATA:  Intracranial hemorrhage. EXAM: CT HEAD WITHOUT CONTRAST TECHNIQUE: Contiguous axial images were obtained from the base of the skull through the vertex without intravenous contrast. COMPARISON:  CT head and MRI brain 06/13/2018. FINDINGS: Brain: The predominantly intraventricular hemorrhage has increased in size since the prior exam. It now measures 6.7 x 2.8 x 3.0 cm (volume = 29 cm^3). In the same dimensions, the hemorrhage previously measured 5.1 x 2.6 x 2.4 cm (volume = 17 cm^3). Areas of subarachnoid hemorrhage involving the left temporal and parietal lobe are more prominent than on the prior exam. Right temporal and parietal subarachnoid hemorrhage is also more prominent than on the prior exam. Areas of subarachnoid hemorrhage involving the right occipital lobe are more prominent than on the prior exam. Focal hemorrhage along the anterior aspect of the left cerebellum is similar to the prior study. Ventricular dilation has not changed. There is no hydrocephalus. A remote lacunar infarct is again noted in the right thalamus. Remote infarcts involving the left internal capsule are stable. Subarachnoid and subdural blood along the posteromedial frontal lobes bilaterally is more prominent than on the prior exam. Vascular: Atherosclerotic changes are noted within the cavernous internal carotid arteries bilaterally. There is no hyperdense vessel. Skull: Calvarium is intact. No acute or healing fractures are present. Left parietal scalp hematoma is similar the prior exam. No focal lytic or blastic lesions are present. Sinuses/Orbits: The paranasal sinuses and mastoid air cells are clear.  Globes and orbits are unremarkable. IMPRESSION: 1. Increased size of left intraventricular hemorrhage, now measuring 6.7 x 2.8 x 3.0 cm without hydrocephalus. 2. Increase in diffuse subarachnoid hemorrhage noted predominantly in the posterior temporal and parietal lobes, the right occipital lobe, and along the medial frontal lobes bilaterally.  3. No significant change in mass effect. 4. Stable atrophy and white matter disease. 5. Stable remote lacunar infarcts involving the basal ganglia. Electronically Signed   By: San Morelle M.D.   On: 06/15/2018 09:37   Dg Chest Port 1 View  Result Date: 06/15/2018 CLINICAL DATA:  Fever.  Intracranial hemorrhage. EXAM: PORTABLE CHEST 1 VIEW COMPARISON:  06/14/2018 FINDINGS: Stable heart size status post CABG. Stable mild atelectasis at the left lung base. There is no evidence of pulmonary edema, consolidation, pneumothorax, nodule or pleural fluid. IMPRESSION: No acute findings. Stable subsegmental atelectasis at the left lung base. Electronically Signed   By: Aletta Edouard M.D.   On: 06/15/2018 08:39   Dg Chest Port 1 View  Result Date: 06/14/2018 CLINICAL DATA:  77 year old male with a history of fever EXAM: PORTABLE CHEST 1 VIEW COMPARISON:  07/24/2016, 07/26/2016 FINDINGS: Cardiomediastinal silhouette likely unchanged with cardiomegaly. Calcifications of the aortic arch. Tortuosity of the thoracic aorta. Surgical changes of median sternotomy and CABG. Low lung volumes. No pneumothorax. No large pleural effusion. Linear opacity at the left lung base. No displaced fracture. IMPRESSION: Low lung volumes with linear opacity at the left lung base potentially atelectasis or new consolidation. Surgical changes of median sternotomy and CABG. Electronically Signed   By: Corrie Mckusick D.O.   On: 06/14/2018 12:18   Dg Swallowing Func-speech Pathology  Result Date: 06/15/2018 Objective Swallowing Evaluation: Type of Study: MBS-Modified Barium Swallow Study   Patient Details Name: HUSAM HOHN MRN: [id number] Date of Birth: January 13, 1941 Today's Date: 06/15/2018 Time: SLP Start Time (ACUTE ONLY): 1102 -SLP Stop Time (ACUTE ONLY): 1122 SLP Time Calculation (min) (ACUTE ONLY): 20 min Past Medical History: Past Medical History: Diagnosis Date . Abnormal echocardiogram   NUCLEAR STRESS TEST, 01/28/2007 - EKG negative for ischemia, no significant ischemia demonstrated . Coronary artery disease 02/03/02  status post CABG by Dr. Merilynn Finland. LIMA-LAD, seq SVG-D1-D2, SVG-OM, SVG-PDA. Marland Kitchen Hyperlipidemia  . Hypertension  . Stroke ([place])  . Structural heart disease   2D ECHO, 01/08/2005 - normal-mild . Syncope   CAROTID DOPPLER, 01/08/2005 - No significant carotid vadcular disease . Type 2 diabetes mellitus ([place])  Past Surgical History: Past Surgical History: Procedure Laterality Date . CARDIAC CATHETERIZATION  01/22/2002  CABG recommended . CORONARY ARTERY BYPASS GRAFT  02/03/2002  x5; LIMA-LAD, SVG-D1 and D2, SVG-OM1, and SVG-PDA . ORIF TIBIA PLATEAU Right 11/02/2013  Procedure: OPEN REDUCTION INTERNAL FIXATION (ORIF) RIGHT TIBIAL PLATEAU;  Surgeon: Rozanna Box, MD;  Location: [place];  Service: Orthopedics;  Laterality: Right; HPI: Patient is a 77 y.o. male with PMH: CVA, HTN, CAD, who presented to ER at [facility] secondary to head injury where he was found to be hypotensive. Head CT revealed multiple areas of ICH including a significant hematoma in left temporal lobe. Repeat CT 7/28 showed Increased size of left intraventricular hemorrhage, now measuring 6.7 x 2.8 x 3.0 cm without hydrocephalus, Increase in diffuse subarachnoid hemorrhage, Stable remote lacunar infarcts involving the basal ganglia. BSE ordered. CXR No acute findings. Stable subsegmental atelectasis at the left lung  Subjective: very lethargic, followed command to open eyes and sqeeze hand, but no others. Assessment / Plan / Recommendation CHL IP CLINICAL IMPRESSIONS 06/15/2018 Clinical Impression Decreased  lingual manipulation, oral holding with prolonged manipulation and delayed transit in addition to frequent lingual residue. Penetration of nectar due to incomplete epiglottic deflection and laryngeal closure remaining above the vocal cords without sensation. Swallow intermittently delayed to the valleculae and mild residue  in the vallecula given inadequate inversion of epiglottis. Cognitive impairments prevent trial of compensatory strategies. Esophageal view showed stasis distal portion. Recommend Dys 1, honey thick liquids, crush meds, full supervision/assist and ensure swallow prior to giving next bite/sip.  SLP Visit Diagnosis Dysphagia, oropharyngeal phase (R13.12) Attention and concentration deficit following -- Frontal lobe and executive function deficit following -- Impact on safety and function Moderate aspiration risk   CHL IP TREATMENT RECOMMENDATION 06/15/2018 Treatment Recommendations Therapy as outlined in treatment plan below   Prognosis 06/15/2018 Prognosis for Safe Diet Advancement Good Barriers to Reach Goals Cognitive deficits Barriers/Prognosis Comment -- CHL IP DIET RECOMMENDATION 06/15/2018 SLP Diet Recommendations Dysphagia 1 (Puree) solids;Honey thick liquids Liquid Administration via Cup Medication Administration Crushed with puree Compensations Minimize environmental distractions;Slow rate;Small sips/bites Postural Changes Seated upright at 90 degrees;Remain semi-upright after after feeds/meals (Comment)   CHL IP OTHER RECOMMENDATIONS 06/15/2018 Recommended Consults -- Oral Care Recommendations Oral care BID Other Recommendations Order thickener from pharmacy   CHL IP FOLLOW UP RECOMMENDATIONS 06/15/2018 Follow up Recommendations Skilled Nursing facility   [facility] IP FREQUENCY AND DURATION 06/15/2018 Speech Therapy Frequency (ACUTE ONLY) min 2x/week Treatment Duration 2 weeks      CHL IP ORAL PHASE 06/15/2018 Oral Phase Impaired Oral - Pudding Teaspoon -- Oral - Pudding Cup -- Oral - Honey Teaspoon --  Oral - Honey Cup Lingual/palatal residue;Delayed oral transit Oral - Nectar Teaspoon -- Oral - Nectar Cup Delayed oral transit;Lingual/palatal residue Oral - Nectar Straw -- Oral - Thin Teaspoon -- Oral - Thin Cup -- Oral - Thin Straw -- Oral - Puree Delayed oral transit;Lingual/palatal residue Oral - Mech Soft Delayed oral transit;Weak lingual manipulation Oral - Regular -- Oral - Multi-Consistency -- Oral - Pill -- Oral Phase - Comment --  CHL IP PHARYNGEAL PHASE 06/15/2018 Pharyngeal Phase Impaired Pharyngeal- Pudding Teaspoon -- Pharyngeal -- Pharyngeal- Pudding Cup -- Pharyngeal -- Pharyngeal- Honey Teaspoon -- Pharyngeal -- Pharyngeal- Honey Cup Pharyngeal residue - valleculae;Reduced airway/laryngeal closure;Reduced epiglottic inversion Pharyngeal -- Pharyngeal- Nectar Teaspoon -- Pharyngeal -- Pharyngeal- Nectar Cup Delayed swallow initiation-vallecula;Pharyngeal residue - valleculae;Penetration/Aspiration during swallow;Reduced airway/laryngeal closure Pharyngeal Material enters airway, remains ABOVE vocal cords and not ejected out Pharyngeal- Nectar Straw -- Pharyngeal -- Pharyngeal- Thin Teaspoon -- Pharyngeal -- Pharyngeal- Thin Cup -- Pharyngeal -- Pharyngeal- Thin Straw -- Pharyngeal -- Pharyngeal- Puree Pharyngeal residue - valleculae Pharyngeal -- Pharyngeal- Mechanical Soft Pharyngeal residue - valleculae Pharyngeal -- Pharyngeal- Regular -- Pharyngeal -- Pharyngeal- Multi-consistency -- Pharyngeal -- Pharyngeal- Pill -- Pharyngeal -- Pharyngeal Comment --  CHL IP CERVICAL ESOPHAGEAL PHASE 06/15/2018 Cervical Esophageal Phase WFL Pudding Teaspoon -- Pudding Cup -- Honey Teaspoon -- Honey Cup -- Nectar Teaspoon -- Nectar Cup -- Nectar Straw -- Thin Teaspoon -- Thin Cup -- Thin Straw -- Puree -- Mechanical Soft -- Regular -- Multi-consistency -- Pill -- Cervical Esophageal Comment -- [place] Siren 06/15/2018, 12:23 PM Orbie Pyo Colvin Caroli.Ed CCC-SLP Pager [phone]                Assessment/Plan: Diagnosis: TBI with left temporal lobe hemorrhage Labs and images (see above) independently reviewed.  Records reviewed and summated above.  1. Does the need for close, 24 hr/day medical supervision in concert with the patient's rehab needs make it unreasonable for this patient to be served in a less intensive setting? Yes  2. Co-Morbidities requiring supervision/potential complications: dysphagia (advance diet as tolerated), tachypnea (monitor RR and O2 Sats with increased physical exertion), HTN (monitor and provide prns in accordance with  increased physical exertion and pain, wean IV meds when appropriate), dementia (cont meds), history of CVA, DM (Monitor in accordance with exercise and adjust meds as necessary), CKD stage III (avoid nephrotoxic meds), CAD (cont meds), hypokalemia (continue to monitor and replete as necessary), leukocytosis (cont to monitor for signs and symptoms of infection, further workup if indicated) 3. Due to bladder management, bowel management, safety, skin/wound care, disease management, medication administration, pain management and patient education, does the patient require 24 hr/day rehab nursing? Yes 4. Does the patient require coordinated care of a physician, rehab nurse, PT (1-2 hrs/day, 5 days/week), OT (1-2 hrs/day, 5 days/week) and SLP (1-2 hrs/day, 5 days/week) to address physical and functional deficits in the context of the above medical diagnosis(es)? Yes Addressing deficits in the following areas: balance, endurance, locomotion, strength, transferring, bowel/bladder control, bathing, dressing, feeding, grooming, toileting, cognition, speech, language, swallowing and psychosocial support 5. Can the patient actively participate in an intensive therapy program of at least 3 hrs of therapy per day at least 5 days per week? Yes 6. The potential for patient to make measurable gains while on inpatient rehab is excellent 7. Anticipated functional  outcomes upon discharge from inpatient rehab are supervision and min assist  with PT, supervision and min assist with OT, supervision and min assist with SLP. 8. Estimated rehab length of stay to reach the above functional goals is: 18-22 days. 9. Anticipated D/C setting: TBD 10. Anticipated post D/C treatments: HH therapy and Home excercise program 11. Overall Rehab/Functional Prognosis: good  RECOMMENDATIONS: This patient's condition is appropriate for continued rehabilitative care in the following setting: CIR once medically stable pending inquiry into baseline cognition and caregiver availablility at discharge. Patient has agreed to participate in recommended program. Potentially Note that insurance prior authorization may be required for reimbursement for recommended care.  Comment: Rehab Admissions Coordinator to follow up.   I have personally performed a face to face diagnostic evaluation, including, but not limited to relevant history and physical exam findings, of this patient and developed relevant assessment and plan.  Additionally, I have reviewed and concur with the physician assistant's documentation above.   Delice Lesch, MD, ABPMR Lavon Paganini Angiulli, PA-C 06/16/2018      Revision History

## 2018-06-18 NOTE — Plan of Care (Signed)
  Problem: Consults Goal: RH GENERAL PATIENT EDUCATION Description See Patient Education module for education specifics. Outcome: Not Progressing Goal: Skin Care Protocol Initiated - if Braden Score 18 or less Description If consults are not indicated, leave blank or document N/A Outcome: Not Progressing Goal: Nutrition Consult-if indicated Outcome: Not Progressing Goal: Diabetes Guidelines if Diabetic/Glucose > 140 Description If diabetic or lab glucose is > 140 mg/dl - Initiate Diabetes/Hyperglycemia Guidelines & Document Interventions  Outcome: Not Progressing   Problem: RH BOWEL ELIMINATION Goal: RH STG MANAGE BOWEL WITH ASSISTANCE Description STG Manage Bowel with mod  Assistance.  Outcome: Not Progressing Goal: RH STG MANAGE BOWEL W/MEDICATION W/ASSISTANCE Description STG Manage Bowel with Medication with min  Assistance.  Outcome: Not Progressing Goal: RH STG MANAGE BOWEL W/EQUIPMENT W/ASSISTANCE Description STG Manage Bowel With Equipment With min Assistance  Outcome: Not Progressing Goal: RH OTHER STG BOWEL ELIMINATION GOALS W/ASSIST Description Other STG Bowel Elimination Goals With Assistance. Outcome: Not Progressing   Problem: RH BLADDER ELIMINATION Goal: RH STG MANAGE BLADDER WITH ASSISTANCE Description STG Manage Bladder With min  Assistance  Outcome: Not Progressing Goal: RH STG MANAGE BLADDER WITH MEDICATION WITH ASSISTANCE Description STG Manage Bladder With Medication With min Assistance.  Outcome: Not Progressing Goal: RH STG MANAGE BLADDER WITH EQUIPMENT WITH ASSISTANCE Description STG Manage Bladder With Equipment With Assistance Outcome: Not Progressing Goal: RH OTHER STG BLADDER ELIMINATION GOALS W/ASSIST Description Other STG Bladder Elimination Goals With Assistance Outcome: Not Progressing   Problem: RH SKIN INTEGRITY Goal: RH STG SKIN FREE OF INFECTION/BREAKDOWN Outcome: Not Progressing Goal: RH STG MAINTAIN SKIN INTEGRITY WITH  ASSISTANCE Description STG Maintain Skin Integrity With min Assistance.  Outcome: Not Progressing Goal: RH STG ABLE TO PERFORM INCISION/WOUND CARE W/ASSISTANCE Description STG Able To Perform Incision/Wound Care With min Assistance.  Outcome: Not Progressing Goal: RH OTHER STG SKIN INTEGRITY GOALS W/ASSIST Description Other STG Skin Integrity Goals With min  Assistance.  Outcome: Not Progressing   Problem: RH SAFETY Goal: RH STG ADHERE TO SAFETY PRECAUTIONS W/ASSISTANCE/DEVICE Description STG Adhere to Safety Precautions With min Assistance/Device.  Outcome: Not Progressing Goal: RH STG DECREASED RISK OF FALL WITH ASSISTANCE Description STG Decreased Risk of Fall With min Assistance.  Outcome: Not Progressing Goal: RH STG DEMO UNDERSTANDING HOME SAFETY PRECAUTIONS Outcome: Not Progressing Goal: RH OTHER STG SAFETY GOALS W/ASSIST Description Other STG Safety Goals With Assistance. Outcome: Not Progressing   Problem: RH PAIN MANAGEMENT Goal: RH STG PAIN MANAGED AT OR BELOW PT'S PAIN GOAL Outcome: Not Progressing Goal: RH OTHER STG PAIN MANAGEMENT GOALS W/ASSIST Description Other STG Pain Management Goals With min Assistance.  Outcome: Not Progressing   Problem: RH KNOWLEDGE DEFICIT GENERAL Goal: RH STG INCREASE KNOWLEDGE OF SELF CARE AFTER HOSPITALIZATION Outcome: Not Progressing   Unable to assess, new admit

## 2018-06-18 NOTE — Discharge Summary (Addendum)
Stroke Discharge Summary  Patient ID: Mark Montoya   MRN: [id number]      DOB: [birth year]  Date of Admission: 06/12/2018 Date of Discharge: 06/18/2018  Attending Physician:  Rosalin Hawking, MD, Stroke MD Consultant(s):   Delice Lesch, MD (Physical Medicine & Rehabtilitation)  Patient's PCP:  Lemmie Evens, MD  Discharge Diagnoses:  Principal Problem:   Cerebral hemorrhage [facility]) Active Problems:   Uncontrolled hypertension   CAD - CABG X 21 January 2002. Low risk Myoview July 2014   IVH (intraventricular hemorrhage) (HCC)   Hypertension   Hypokalemia   Scalp laceration   Dysphagia   Tachypnea   Dementia with behavioral disturbance   History of CVA (cerebrovascular accident)   Diabetes mellitus type 2 in nonobese (HCC)   Stage 3 chronic kidney disease ([place])   Leukocytosis   Delirium   Anxiety   Depression  Past Medical History:  Diagnosis Date  . Abnormal echocardiogram    NUCLEAR STRESS TEST, 01/28/2007 - EKG negative for ischemia, no significant ischemia demonstrated  . Coronary artery disease 02/03/02   status post CABG by Dr. Merilynn Finland. LIMA-LAD, seq SVG-D1-D2, SVG-OM, SVG-PDA.  Marland Kitchen Hyperlipidemia   . Hypertension   . Stroke ([place])   . Structural heart disease    2D ECHO, 01/08/2005 - normal-mild  . Syncope    CAROTID DOPPLER, 01/08/2005 - No significant carotid vadcular disease  . Type 2 diabetes mellitus ([place])    Past Surgical History:  Procedure Laterality Date  . CARDIAC CATHETERIZATION  01/22/2002   CABG recommended  . CORONARY ARTERY BYPASS GRAFT  02/03/2002   x5; LIMA-LAD, SVG-D1 and D2, SVG-OM1, and SVG-PDA  . ORIF TIBIA PLATEAU Right 11/02/2013   Procedure: OPEN REDUCTION INTERNAL FIXATION (ORIF) RIGHT TIBIAL PLATEAU;  Surgeon: Rozanna Box, MD;  Location: [place];  Service: Orthopedics;  Laterality: Right;    Medications to be continued on Rehab Allergies as of 06/18/2018      Reactions   Lipitor [atorvastatin Calcium]    Other    provastatin    Simvastatin       Medication List    STOP taking these medications   aspirin 81 MG tablet   diphenhydrAMINE 25 mg capsule Commonly known as:  BENADRYL   hydrochlorothiazide 12.5 MG tablet Commonly known as:  HYDRODIURIL   lisinopril 40 MG tablet Commonly known as:  PRINIVIL,ZESTRIL   nitroGLYCERIN 0.4 MG SL tablet Commonly known as:  NITROSTAT   potassium chloride SA 20 MEQ tablet Commonly known as:  K-DUR,KLOR-CON     TAKE these medications   ALPRAZolam 0.25 MG tablet Commonly known as:  XANAX Take 0.5 tablets (0.125 mg total) by mouth at bedtime. What changed:    medication strength  how much to take  when to take this  additional instructions   amLODipine 10 MG tablet Commonly known as:  NORVASC Take 10 mg by mouth daily.   B-12 1000 MCG/ML Kit Inject 1 mL as directed every 30 (thirty) days.   cloNIDine 0.3 MG tablet Commonly known as:  CATAPRES Take 1 tablet (0.3 mg total) by mouth 3 (three) times daily. What changed:    medication strength  when to take this  additional instructions   CRESTOR 10 MG tablet Generic drug:  rosuvastatin Take 10 mg by mouth daily.   donepezil 10 MG tablet Commonly known as:  ARICEPT Take 1 tablet by mouth at bedtime.   ferrous sulfate 325 (65 FE) MG tablet Take 325 mg  by mouth daily with breakfast.   glipiZIDE 5 MG tablet Commonly known as:  GLUCOTROL Take 1 tablet (5 mg total) by mouth daily before breakfast.   haloperidol lactate 5 MG/ML injection Commonly known as:  HALDOL Inject 0.5 mLs (2.5 mg total) into the vein every 6 (six) hours as needed.   hydrALAZINE 50 MG tablet Commonly known as:  APRESOLINE Take 1 tablet (50 mg total) by mouth every 8 (eight) hours.   insulin aspart 100 UNIT/ML injection Commonly known as:  novoLOG Inject 0-15 Units into the skin 3 (three) times daily with meals.   metFORMIN 500 MG tablet Commonly known as:  GLUCOPHAGE Take 1,000 mg by mouth 2 (two) times daily with  a meal.   multivitamin tablet Take 1 tablet by mouth daily.   pantoprazole sodium 40 mg/20 mL Pack Commonly known as:  PROTONIX Place 20 mLs (40 mg total) into feeding tube daily. Start taking on:  06/19/2018   QUEtiapine 25 MG tablet Commonly known as:  SEROQUEL Take 0.5 tablets (12.5 mg total) by mouth 2 (two) times daily.   RESOURCE THICKENUP CLEAR Powd Take 120 g by mouth as needed. For honey thick liquid consistency   senna-docusate 8.6-50 MG tablet Commonly known as:  Senokot-S Take 1 tablet by mouth 2 (two) times daily.   sertraline 100 MG tablet Commonly known as:  ZOLOFT Take 100 mg by mouth daily.   tamsulosin 0.4 MG Caps capsule Commonly known as:  FLOMAX Take 2 capsules (0.8 mg total) by mouth daily after breakfast.   vitamin C 500 MG tablet Commonly known as:  ASCORBIC ACID Take 500 mg by mouth daily.       LABORATORY STUDIES CBC    Component Value Date/Time   WBC 9.6 06/18/2018 0429   RBC 3.42 (L) 06/18/2018 0429   HGB 10.2 (L) 06/18/2018 0429   HCT 31.9 (L) 06/18/2018 0429   PLT 127 (L) 06/18/2018 0429   MCV 93.3 06/18/2018 0429   MCH 29.8 06/18/2018 0429   MCHC 32.0 06/18/2018 0429   RDW 13.8 06/18/2018 0429   LYMPHSABS 1.7 06/12/2018 1955   MONOABS 0.7 06/12/2018 1955   EOSABS 0.1 06/12/2018 1955   BASOSABS 0.0 06/12/2018 1955   CMP    Component Value Date/Time   NA 142 06/18/2018 0429   K 3.8 06/18/2018 0429   CL 104 06/18/2018 0429   CO2 25 06/18/2018 0429   GLUCOSE 232 (H) 06/18/2018 0429   BUN 35 (H) 06/18/2018 0429   CREATININE 1.65 (H) 06/18/2018 0429   CALCIUM 8.7 (L) 06/18/2018 0429   PROT 5.8 (L) 07/26/2016 0902   ALBUMIN 3.2 (L) 07/26/2016 0902   AST 52 (H) 07/26/2016 0902   ALT 16 (L) 07/26/2016 0902   ALKPHOS 65 07/26/2016 0902   BILITOT 0.9 07/26/2016 0902   GFRNONAA 38 (L) 06/18/2018 0429   GFRAA 44 (L) 06/18/2018 0429   COAGS Lab Results  Component Value Date   INR 1.04 06/13/2018   INR 0.89 10/29/2013    Lipid Panel    Component Value Date/Time   CHOL 125 06/14/2018 0216   TRIG 260 (H) 06/14/2018 0216   TRIG 251 (H) 06/14/2018 0216   HDL 38 (L) 06/14/2018 0216   CHOLHDL 3.3 06/14/2018 0216   VLDL 52 (H) 06/14/2018 0216   LDLCALC 35 06/14/2018 0216   HgbA1C  Lab Results  Component Value Date   HGBA1C 7.0 (H) 06/14/2018   Urinalysis    Component Value Date/Time   COLORURINE YELLOW  06/15/2018 1430   APPEARANCEUR CLOUDY (A) 06/15/2018 1430   LABSPEC 1.015 06/15/2018 1430   PHURINE 5.0 06/15/2018 1430   GLUCOSEU 50 (A) 06/15/2018 1430   HGBUR NEGATIVE 06/15/2018 1430   BILIRUBINUR NEGATIVE 06/15/2018 1430   KETONESUR 5 (A) 06/15/2018 1430   PROTEINUR NEGATIVE 06/15/2018 1430   UROBILINOGEN 0.2 11/03/2013 0830   NITRITE NEGATIVE 06/15/2018 1430   LEUKOCYTESUR NEGATIVE 06/15/2018 1430   Urine Drug Screen No results found for: LABOPIA, COCAINSCRNUR, LABBENZ, AMPHETMU, THCU, LABBARB  Alcohol Level No results found for: [facility]   SIGNIFICANT DIAGNOSTIC STUDIES  Ct Head Wo Contrast  06/15/2018 1. Increased size of left intraventricular hemorrhage, now measuring 6.7 x 2.8 x 3.0 cm without hydrocephalus. 2. Increase in diffuse subarachnoid hemorrhage noted predominantly in the posterior temporal and parietal lobes, the right occipital lobe, and along the medial frontal lobes bilaterally. 3. No significant change in mass effect. 4. Stable atrophy and white matter disease. 5. Stable remote lacunar infarcts involving the basal ganglia.   MRI Brain/MRA Head  06/13/2018 1. Left temporal lobe hemorrhage with intraventricular extension appears stable since the CT earlier today. Intra-axial blood products estimated at 17 mL. Surrounding edema. Mild regional mass effect. 2. Small volume but more widespread bilateral subarachnoid hemorrhage than was evident by CT. Small volume intraventricular hemorrhage with no ventriculomegaly. 3. No acute infarct or new intracranial abnormality. 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 4. Motion degraded intracranial MRA with no large vessel occlusion or stenosis identified.  Ct Head Wo Contrast 06/13/2018 1. Slight increase in size of left temporal lobe intraparenchymal hematoma without midline shift or new mass effect.  2. Increased intraventricular blood in the left temporal horn without hydrocephalus.  3. Unchanged small volume subarachnoid hemorrhage along the right sylvian fissure.   Ct Head Wo Contrast 06/12/2018 1. 5.5 cc LEFT temporal lobe hematoma likely posttraumatic given head injury though hypertensive etiology possible. Small volume LEFT lateral intraventricular hemorrhage. Small volume subarachnoid hemorrhage.  2. Moderate LEFT scalp hematoma.  No skull fracture.  3. Multiple old small infarcts. Mild chronic small vessel ischemic changes.   EKG  SR with 1st degree AV block   DG Chest Portable 1 View 06/14/2018 Low lung volumes with linear opacity at the left lung base potentially atelectasis or new consolidation. Surgical changes of median sternotomy and CABG.  Echocardiogram  06/14/2018 - Left ventricle: The cavity size was normal. Systolic function wasvigorous. The estimated ejection fraction was in the range of 65%to 70%. Wall motion was normal; there were no regional wallmotion abnormalities. There was an increased relativecontribution of atrial contraction to ventricular filling.Doppler parameters are consistent with abnormal left ventricularrelaxation (grade 1 diastolic dysfunction). - Aortic valve: Trileaflet; moderately thickened, moderatelycalcified leaflets. 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 There was mildregurgitation. Valve area (VTI): 1.51 cm^2. Valve area (Vmax):1.44 cm^2. Valve area (Vmean): 1.48 cm^2. - Aorta: Aortic root dimension: 37 mm (ED). - Aortic root: The aortic root was mildly dilated. - Mitral valve: There was mild regurgitation. - Left atrium: The  atrium was mildly dilated. - Right atrium: The atrium was mildly dilated. - Tricuspid valve: There was mild regurgitation. - Pulmonary arteries: PA peak pressure: 32 mm Hg (S).  B/L Carotid U/S  06/13/2018 Right Carotid: Velocities in the right ICA are consistent with a 1-39% stenosis. Left Carotid: Velocities in the left ICA are consistent with a 1-39% stenosis. Vertebrals: Bilateral vertebral arteries demonstrate antegrade flow.     HISTORY OF PRESENT ILLNESS Mark Montoya  Hazel is a 77 y.o. male with a history of stroke who presents with ICH. He went to [facility] ER due to head injury where he was found to be quite hypertensive. A head CT was performed and deomnstrates multiple areas of ICH including a significant hematoma in the left temporal lobe. He is a poor historian and somewhat aphasic, making history unreliable. He was LKW 7/25, time unclear. ICH Score: 0. Modified Rankin Scale: 0. Admitted to the neuro ICU.   [place] 77 year old male with history of stroke, CAD status post CABG 2003, hypertension, hyperlipidemia, diabetes and syncope presenting to [facility] with laceration to his L parietal scalp unsure how it happened with confusion. Found to be hypertensive. CT showed multiple areas of ICH, largest L temporal lobe. Transferred to [facility]. ICH w/ IVH d/t hypertension  Stroke: Left PCA territory ICH with left lateral ventricle small IVH and right sylvian fissure SAH due to hypertension  CT of the brain left lateral intraventricular hemorrhage, small volume subarachnoid hemorrhage, 5.5 cc left temporal lobe   Repeat CT of the brain 06/13/18 -mild increase in the size of the left temporal lobe intraparenchymal hematoma, no hydrocephalus, no mass effect or midline shift  MRI of the brain -Stable Left temporal lobe hemorrhage with intraventricular extension Surrounding edema with mild regional mass effect, small volume but more widespread bilateral subarachnoid  MRA  of the brain:  no large vessel occlusion or stenosis identified.  Carotid Doppler - unremarkable  2D Echocardiogram - EF 65 - 70%. No cardiac source of emboli identified.  LDL- 35  HgbA1c - 7.0  Antiplatelets: on aspirin 81 mg daily PTA, None at this time secondary to intracranial hemorrhage  Therapy recommendations:  CIR   Disposition:CIR  In-hospital delirium  Sundowning in ICU  Secondary to baseline dementia  Started Seroquel low-dose 12.5 twice daily  Haldol IV as needed  continue home Aricept  left scalp hematoma secondary to trauma  CT confirmed left scalp hematoma  Supportive care with dressing  Hypertension  BP as high as 194/81 on arrival  Treated with cleviprex  BP stable now  On home amlodipine 10 mg, clonidine 0.3 mg tid  on hydralazine 50 every 8h  hold lisinopril and HCTZ in the setting of AKI on CKD.  Hyperlipidemia  Home meds: Crestor 10 mg  LDL35 goal < 70  Resume state at d/c  AKI on CKD stage III  Creatinine 2.07->1.92->1.77->1.59, baseline 1.15  Hold all nephrotoxic medications including lisinopril for now  Type 2 diabetes mellitus  Still has hyperglycemia and hypoglycemia episodes  Resume metformin and glipizide  DB coordinator recommends lantus and increasing SSI.   Defer CBG monitoring and med adjustment to rehab team for tonight - unable to adjust orders d/t signed and held status  Hemoglobin A1c 7.0  Continue carb modified diet  Leukocytosis  Stable. WBC 12-> 13.9-> 12.4->11.2  Blood culture NGTD  Other Stroke Risk Factors  Advanced age  Smokeless Tobacco  Hx of CAD post CABG in 2003  Family hx stroke (Father)  Other issues  Hypokalemia, resolved - supplement - 3.2 -> 3.4->3.4    Anxiety depression, continue sertraline daily and Xanax as needed   DISCHARGE EXAM Blood pressure (!) 149/68, pulse 72, temperature 98.6 F (37 C), temperature source Oral, resp. rate 18, height _0   (1.676 m), weight 78.5 kg (173 lb 1 oz), SpO2 98 %. HEENT- left occipital scalp hematoma with dressing, CDI. Normal external eye and conjunctiva.  Very  poor dentition with blackening gums Cardiovascular- S1-S2 audible, pulses palpable throughout   Lungs-no rhonchi or wheezing noted, no excessive working breathing.  Saturations within normal limits Musculoskeletal-no joint tenderness, deformity or swelling patient does move extremities Skin-warm and dry  Neuro: Mental Status: Patient wakens easily to voice, not orientated to place, self, time, not able to name but able to repeat. Patient  follows some limited commands, but not most, able to mimic actions. Cranial Nerves: intact except poor denture, moderate dysarthria Motor: he does move all extremities equally Right :  Upper extremity   4/5                                      Left:     Upper extremity   4/5             Lower extremity   3/5                                                  Lower extremity   3/5 Tone and bulk:normal tone throughout; no atrophy noted Sensory: Patient does withdraw to stimuli and is in arms and legs  Deep Tendon Reflexes: 1+ and symmetric throughout Plantars: Right: downgoing                                Left: downgoing Cerebellar: Not cooperative but no frank ataxia Gait: Not assessed  Discharge Diet  Dysphagia I honey thick liquids  DISCHARGE PLAN  Disposition:  Transfer to [facility] for ongoing PT, OT and ST  Due to hemorrhage and risk of bleeding, do not take aspirin, aspirin-containing medications, or ibuprofen products   Monitor BP and renal function. Adjust meds as needed.  Recommend ongoing risk factor control by Primary Care Physician at time of discharge from inpatient rehabilitation.  Follow-up Lemmie Evens, MD in 2 weeks following discharge from rehab.  Follow-up in [facility] in 4 weeks following discharge from rehab, office  to schedule an appointment.   45 minutes were spent preparing discharge.  Burnetta Sabin, MSN, APRN, ANVP-BC, AGPCNP-BC Advanced Practice Stroke Nurse [place] for Schedule & Pager information 06/18/2018 2:35 PM   ATTENDING NOTE: I reviewed above note and agree with the assessment and plan. I have made any additions or clarifications directly to the above note. Pt was seen and examined.   Pt sitting in chair with lunch, mildly sleepy. No behavior disturbance overnight. On seroquel bid now. Sleepiness could be due to medication effect, however, easily woke up and interactive with provider. Still has partial global aphasia, but moving all extremities with BUE tremors. Neuro stable, ready for discharge to CIR. He will follow up with [facility] stroke clinic in 4 weeks.  Rosalin Hawking, MD PhD Stroke Neurology 06/18/2018 4:56 PM

## 2018-06-18 NOTE — Progress Notes (Signed)
Inpatient Diabetes Program Recommendations  AACE/ADA: New Consensus Statement on Inpatient Glycemic Control (2015)  Target Ranges:  Prepandial:   less than 140 mg/dL      Peak postprandial:   less than 180 mg/dL (1-2 hours)      Critically ill patients:  140 - 180 mg/dL   Lab Results  Component Value Date   GLUCAP 209 (H) 06/18/2018   HGBA1C 7.0 (H) 06/14/2018    Review of Glycemic ControlResults for [name], [name] (MRN [id number]) as of 06/18/2018 12:52  Ref. Range 06/17/2018 11:43 06/17/2018 16:56 06/17/2018 21:34 06/18/2018 06:42 06/18/2018 11:42  Glucose-Capillary Latest Ref Range: 70 - 99 mg/dL 595177 (H) 638250 (H) 756248 (H) 217 (H) 209 (H)   Diabetes history: Type 2 DM  Outpatient Diabetes medications:  Glipizide 5 mg daily, Metformin 1000 mg bid Current orders for Inpatient glycemic control:  Glipizide 5 mg with breakfast, Metformin 1000 mg bid Novolog moderate tid with meals Inpatient Diabetes Program Recommendations:  May consider adding Lantus 10 units daily while in the hospital.   Thanks,  Beryl MeagerJenny Aristide Waggle, RN, BC-ADM Inpatient Diabetes Coordinator Pager [phone] (8a-5p)

## 2018-06-18 NOTE — Progress Notes (Addendum)
Pt A/O, no noted distress. Upper dentures. Incont B/B, Scattered bruising, X2 IV A/C. Granddaughter via phone assisted with admission by phone. Staff will continue to assist with needs. Pt came on unit with posey belt on.  Pt is a supervised and total feeder.

## 2018-06-18 NOTE — Care Management Note (Signed)
Case Management Note  Patient Details  Name: Elgie Collardarl F Mittag MRN: [id number] Date of Birth: 12-29-1940  Subjective/Objective:                    Action/Plan: Pt discharging to CIR today. CM signing off.   Expected Discharge Date:  06/18/18               Expected Discharge Plan:  IP Rehab Facility  In-House Referral:     Discharge planning Services  CM Consult  Post Acute Care Choice:    Choice offered to:     DME Arranged:    DME Agency:     HH Arranged:    HH Agency:     Status of Service:  Completed, signed off  If discussed at [employer] Meetings, dates discussed:    Additional Comments:  Kermit BaloKelli F Jillane Po, RN 06/18/2018, 10:42 AM

## 2018-06-18 NOTE — Progress Notes (Signed)
Occupational Therapy Treatment Patient Details Name: Mark Montoya MRN: [id number] DOB: 1941-04-05 Today's Date: 06/18/2018    History of present illness 77 y.o. male with a history of stroke who presents with ICH. He went to [facility] due to head injury where he was found to be hypertensive. A head CT was performed and demonstrates multiple areas of ICH including a significant hematoma in the left temporal lobe.    OT comments  Pt repositioned in the bed to decr fall risk after prolonged time in chair today. Pt needs redirection to task and delayed response to commands. Pt demonstrates total +2 Mod (A) for stand pivot and mod (A) for adls over all    Follow Up Recommendations  CIR;Supervision/Assistance - 24 hour    Equipment Recommendations  Other (comment)    Recommendations for Other Services Rehab consult    Precautions / Restrictions Precautions Precautions: Fall Restrictions Weight Bearing Restrictions: No       Mobility Bed Mobility Overal bed mobility: Needs Assistance Bed Mobility: Sit to Supine       Sit to supine: Mod assist   General bed mobility comments: pt needs (A) to sequence and elevate BIL Le onto bed surface  Transfers Overall transfer level: Needs assistance   Transfers: Stand Pivot Transfers Sit to Stand: Mod assist;+2 physical assistance Stand pivot transfers: +2 physical assistance;Mod assist       General transfer comment: pt needs (A) to shift weight and to initiate task. pt with delayed response    Balance Overall balance assessment: Needs assistance   Sitting balance-Leahy Scale: Fair       Standing balance-Leahy Scale: Poor                             ADL either performed or assessed with clinical judgement   ADL Overall ADL's : Needs assistance/impaired                         Toilet Transfer: +2 for physical assistance;Moderate assistance;Stand-pivot Toilet Transfer Details (indicate cue type  and reason): simulated chair to bed           General ADL Comments: pt with chair alarm soundin gon arrival. pt noted to void bladder and attempting to scoot out of the chair. pt repositioned in chair and then transfered to the bed . pt less restless once supine and changed position.      Vision       Perception     Praxis      Cognition Arousal/Alertness: Lethargic Behavior During Therapy: Flat affect Overall Cognitive Status: Impaired/Different from baseline Area of Impairment: Orientation;Following commands;Safety/judgement                 Orientation Level: Disoriented to;Person;Place;Time;Situation Current Attention Level: Focused Memory: Decreased recall of precautions;Decreased short-term memory Following Commands: Follows one step commands inconsistently Safety/Judgement: Decreased awareness of safety Awareness: Intellectual   General Comments: pt opening eyes and responding to name/ pt unable to report location. 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"        Exercises     Shoulder Instructions       General Comments needs (A) to change positions and fall risk    Pertinent Vitals/ Pain       Pain Assessment: No/denies pain  Home Living  Prior Functioning/Environment              Frequency  Min 3X/week        Progress Toward Goals  OT Goals(current goals can now be found in the care plan section)  Progress towards OT goals: Progressing toward goals  Acute Rehab OT Goals Patient Stated Goal: none stated OT Goal Formulation: With patient/family Time For Goal Achievement: 06/29/18 Potential to Achieve Goals: Good ADL Goals Pt Will Perform Grooming: with min guard assist;standing Pt Will Perform Upper Body Bathing: with set-up;with supervision;sitting Pt Will Perform Lower Body Bathing: with min guard assist;sit to/from stand Pt Will Transfer to Toilet: with min guard  assist;ambulating;bedside commode Pt Will Perform Toileting - Clothing Manipulation and hygiene: with min guard assist;sit to/from stand Additional ADL Goal #1: Pt will follow 3 step command in a minimally distracting environment.  Plan Discharge plan remains appropriate    Co-evaluation                 AM-PAC PT "6 Clicks" Daily Activity     Outcome Measure   Help from another person eating meals?: A Little Help from another person taking care of personal grooming?: A Little Help from another person toileting, which includes using toliet, bedpan, or urinal?: A Lot Help from another person bathing (including washing, rinsing, drying)?: A Lot Help from another person to put on and taking off regular upper body clothing?: A Little Help from another person to put on and taking off regular lower body clothing?: A Lot 6 Click Score: 15    End of Session Equipment Utilized During Treatment: Gait belt  OT Visit Diagnosis: Unsteadiness on feet (R26.81);Other abnormalities of gait and mobility (R26.89);History of falling (Z91.81);Muscle weakness (generalized) (M62.81);Other symptoms and signs involving cognitive function   Activity Tolerance Patient tolerated treatment well   Patient Left in bed;with call bell/phone within reach;with bed alarm set;with nursing/sitter in room;with restraints reapplied(posey on bed applied)   Nurse Communication Mobility status;Precautions        Time: 1610-9604 OT Time Calculation (min): 12 min  Charges: OT General Charges $OT Visit: 1 Visit OT Treatments $Therapeutic Activity: 8-22 mins   Mateo Flow   OTR/L Pager: [phone] Office: [phone] .    Boone Master B 06/18/2018, 4:00 PM

## 2018-06-18 NOTE — Progress Notes (Signed)
Marcello Fennel, MD  Physician  Physical Medicine and Rehabilitation  PMR Pre-admission  Addendum  Date of Service:  06/16/2018 4:02 PM       Related encounter: ED to Hosp-Admission (Current) from 06/12/2018 in [place] [facility]           Show:Clear all [x] Manual[x] Template[x] Copied  Added by: [x] Standley Brooking, RN   [] Hover for details   PMR Admission Coordinator Pre-Admission Assessment  Patient: Mark Montoya is an 77 y.o., male MRN: [id number] DOB: [birth year] Height: 5\' 6"  (167.6 cm) Weight: 78.5 kg (173 lb 1 oz)                                                                                                                                                  Insurance Information HMO: yes    PPO:      PCP:      IPA:      80/20:      OTHER: medicare advantage plan PRIMARY: [employer] Medicare      Policy#: [id number]      Subscriber: pt CM Name: Jasmine Pang      Phone#: [phone]     Fax#: [phone] Pre-Cert#: [id number] approved for 7 days with f/u Rebeca Alert phone [phone] fax [phone]      Employer: retired Benefits:  Phone #: [phone]     Name: 06/16/18 Eff. Date: 11/19/2017     Deduct: none      Out of Pocket Max: $4400      Life Max: none CIR: $345 co pay per day, days 1 until 5      SNF: no co pay days 1 until 20; $160 co pay per day days 21 until 48; no co pay days 49 until 100 Outpatient: $40 co pay per visit     Co-Pay: visits per medical neccesity Home Health: 100%      Co-Pay: visits per medical neccesity DME: 80%     Co-Pay: 20% Providers: in network  SECONDARY: none       Medicaid Application Date:       Case Manager:  Disability Application Date:       Case Worker:   Emergency Contact Information         Contact Information    Name Relation Home Work Mobile   Jasiri, [place]   [phone]   Tomassi,Stacy Granddaughter [phone]       Current Medical History  Patient Admitting  Diagnosis: TBI with temporal lobe hemorrhage  History of Present Illness:  Mark Montoya is a 77 year old right-handed male with history of dementia maintained on Aricept, CVA maintained on aspirin, hypertension, diabetes mellitus, CKD stage III, CAD with CABG 2003. Presented 06/13/2018 to [facility] after [place] with laceration left parietal scalp. Patient cannot recall full events of the fall. Patient noted  to be hypertensive. Cranial CT scan showed left temporal hemorrhage. Per report, 5.5 cc left temporal lobe hematoma. Small volume left lateral intraventricular hemorrhage. Moderate left scalp hematoma without skull fracture. Multiple old small infarcts. MRI of the brain again demonstrated left temporal lobe hemorrhage with surrounding edema as well as mild regional mass-effect. MRA with no large vessel occlusion or stenosis. Echocardiogram with ejection fraction of 70% no wall motion abnormalities. Patient also underwent repair of scalp laceration. Latest fall cranial CT scan 06/15/2018 showing increased size of left ventricular hemorrhage measuring 6.7 x 2.8 x 3.0 cm without hydrocephalus. Increase in diffuse subarachnoid hemorrhage noted predominantly in the posterior temporal parietal lobes, the right occipital lobe and along the medial frontal lobes bilaterally. No significant change in mass-effect and continue to monitor. Subcutaneous heparin initiated for DVT prophylaxis 06/17/2018 currently on a dysphagia #1 honey thick liquid diet.   Total: 4 NIHSS  Past Medical History      Past Medical History:  Diagnosis Date  . Abnormal echocardiogram    NUCLEAR STRESS TEST, 01/28/2007 - EKG negative for ischemia, no significant ischemia demonstrated  . Coronary artery disease 02/03/02   status post CABG by Dr. Andrey Spearman. LIMA-LAD, seq SVG-D1-D2, SVG-OM, SVG-PDA.  Marland Kitchen Hyperlipidemia   . Hypertension   . Stroke (HCC)   . Structural heart disease    2D  ECHO, 01/08/2005 - normal-mild  . Syncope    CAROTID DOPPLER, 01/08/2005 - No significant carotid vadcular disease  . Type 2 diabetes mellitus (HCC)     Family History  family history includes CVA in his father; Cancer in his brother, brother, and brother; Diabetes in his daughter; Heart disease in his brother, brother, brother, daughter, father, and mother; Heart failure in his mother; Hypertension in his daughter; Lupus in his daughter; Multiple sclerosis in his daughter.  Prior Rehab/Hospitalizations:  Has the patient had major surgery during 100 days prior to admission? No  Current Medications   Current Facility-Administered Medications:  .  0.9 %  sodium chloride infusion, , Intravenous, Continuous, Marvel Plan, MD, Stopped at 06/17/18 1004 .  acetaminophen (TYLENOL) tablet 650 mg, 650 mg, Oral, Q4H PRN, 650 mg at 06/15/18 2044 **OR** acetaminophen (TYLENOL) solution 650 mg, 650 mg, Per Tube, Q4H PRN **OR** acetaminophen (TYLENOL) suppository 650 mg, 650 mg, Rectal, Q4H PRN, Rejeana Brock, MD, 650 mg at 06/14/18 1623 .  ALPRAZolam Prudy Feeler) tablet 0.125 mg, 0.125 mg, Oral, QHS, Eshraghi, Shervin, MD, 0.125 mg at 06/17/18 2330 .  amLODipine (NORVASC) tablet 10 mg, 10 mg, Oral, Daily, Rejeana Brock, MD, 10 mg at 06/18/18 9629 .  chlorhexidine (PERIDEX) 0.12 % solution 15 mL, 15 mL, Mouth Rinse, BID, Micki Riley, MD, 15 mL at 06/17/18 2200 .  cloNIDine (CATAPRES) tablet 0.3 mg, 0.3 mg, Oral, TID, Marvel Plan, MD, 0.3 mg at 06/18/18 5284 .  donepezil (ARICEPT) tablet 10 mg, 10 mg, Oral, QHS, Nyanor, Pearl O, NP, 10 mg at 06/17/18 2331 .  ferrous sulfate tablet 325 mg, 325 mg, Oral, Q breakfast, Nyanor, Pearl O, NP, 325 mg at 06/18/18 0926 .  glipiZIDE (GLUCOTROL) tablet 5 mg, 5 mg, Oral, QAC breakfast, Marvel Plan, MD, 5 mg at 06/18/18 1324 .  haloperidol lactate (HALDOL) injection 2.5 mg, 2.5 mg, Intravenous, Q6H PRN, Marvel Plan, MD, 2.5 mg at 06/16/18 1749 .   heparin injection 5,000 Units, 5,000 Units, Subcutaneous, Q8H, Marvel Plan, MD, 5,000 Units at 06/18/18 [phone] .  hydrALAZINE (APRESOLINE) tablet 50 mg, 50 mg, Oral, Q8H, Xu,  Jindong, MD, 50 mg at 06/18/18 0608 .  insulin aspart (novoLOG) injection 0-15 Units, 0-15 Units, Subcutaneous, TID WC, Rejeana Brock, MD, 5 Units at 06/18/18 0720 .  labetalol (NORMODYNE,TRANDATE) injection 10 mg, 10 mg, Intravenous, Q2H PRN, Weston Settle, MD, 10 mg at 06/17/18 0207 .  MEDLINE mouth rinse, 15 mL, Mouth Rinse, q12n4p, Micki Riley, MD, 15 mL at 06/17/18 1653 .  metFORMIN (GLUCOPHAGE) tablet 1,000 mg, 1,000 mg, Oral, BID WC, Marvel Plan, MD, 1,000 mg at 06/18/18 0923 .  pantoprazole sodium (PROTONIX) 40 mg/20 mL oral suspension 40 mg, 40 mg, Per Tube, Daily, Marvel Plan, MD, 40 mg at 06/18/18 0927 .  QUEtiapine (SEROQUEL) tablet 12.5 mg, 12.5 mg, Oral, BID, Marvel Plan, MD, 12.5 mg at 06/18/18 0923 .  RESOURCE THICKENUP CLEAR, , Oral, PRN, Micki Riley, MD .  senna-docusate (Senokot-S) tablet 1 tablet, 1 tablet, Oral, BID, Rejeana Brock, MD, 1 tablet at 06/18/18 [phone] .  sertraline (ZOLOFT) tablet 100 mg, 100 mg, Oral, Daily, Rejeana Brock, MD, 100 mg at 06/18/18 9604 .  tamsulosin (FLOMAX) capsule 0.8 mg, 0.8 mg, Oral, QPC breakfast, Rejeana Brock, MD, 0.8 mg at 06/18/18 5409  Patients Current Diet:       Diet Order           DIET - DYS 1 Room service appropriate? Yes; Fluid consistency: Honey Thick  Diet effective now          Precautions / Restrictions Precautions Precautions: Fall Restrictions Weight Bearing Restrictions: No   Has the patient had 2 or more falls or a fall with injury in the past year?No  Prior Activity Level Limited Community (1-2x/wk): Independent and driving; did own Administrator / Equipment Home Assistive Devices/Equipment: Grab bars in shower Home Equipment: None(per daughter, pt refuses AD)  Prior  Device Use: Indicate devices/aids used by the patient prior to current illness, exacerbation or injury? None of the above  Prior Functional Level Prior Function Level of Independence: Independent  Self Care: Did the patient need help bathing, dressing, using the toilet or eating?  Independent  Indoor Mobility: Did the patient need assistance with walking from room to room (with or without device)? Independent  Stairs: Did the patient need assistance with internal or external stairs (with or without device)? Independent  Functional Cognition: Did the patient need help planning regular tasks such as shopping or remembering to take medications? Independent  Current Functional Level Cognition  Arousal/Alertness: Lethargic Overall Cognitive Status: Impaired/Different from baseline Current Attention Level: Sustained Orientation Level: Disoriented to place, Disoriented to time, Disoriented to situation Following Commands: Follows one step commands with increased time, Follows multi-step commands inconsistently Safety/Judgement: Decreased awareness of safety, Decreased awareness of deficits Attention: Focused Focused Attention: Impaired Focused Attention Impairment: Verbal basic, Functional basic    Extremity Assessment (includes Sensation/Coordination)  Upper Extremity Assessment: Generalized weakness  Lower Extremity Assessment: Defer to PT evaluation    ADLs  Overall ADL's : Needs assistance/impaired Eating/Feeding: Set up, Supervision/ safety, Sitting Grooming: Set up, Supervision/safety, Sitting Upper Body Bathing: Minimal assistance, Sitting Lower Body Bathing: Moderate assistance, Sit to/from stand Upper Body Dressing : Minimal assistance, Sitting Lower Body Dressing: Minimal assistance, Sit to/from stand Lower Body Dressing Details (indicate cue type and reason): Pt able to don socks in sitting with min assist. Would need additional assist in standing for other  clothing Toilet Transfer: Moderate assistance, +2 for physical assistance, Ambulation Toilet Transfer Details (indicate cue type and reason):  Simulated by sit to stand from EOB with functional mobility in room Functional mobility during ADLs: Moderate assistance, +2 for physical assistance    Mobility  Overal bed mobility: Needs Assistance Bed Mobility: Supine to Sit Supine to sit: Min assist General bed mobility comments: Min assist with cues for initiation    Transfers  Overall transfer level: Needs assistance Equipment used: Rolling walker (2 wheeled) Transfers: Sit to/from Stand Sit to Stand: +2 physical assistance, Mod assist General transfer comment: Moderate assist to power up to standing with bilateral UE support. Some posterior lean upon initial elevation to upright    Ambulation / Gait / Stairs / Wheelchair Mobility  Ambulation/Gait Ambulation/Gait assistance: Mod assist, +2 safety/equipment Gait Distance (Feet): 60 Feet Assistive device: Rolling walker (2 wheeled) Gait Pattern/deviations: Step-to pattern, Decreased stride length, Shuffle, Scissoring, Drifts right/left, Trunk flexed General Gait Details: Patient with improved step initiation today but noted instability despite UE support. Patient requiring wrap around facilitation of upright, multi modal cues for posture and positioning. Tactile cues to facilitate weight shift. Gait velocity: decreased Gait velocity interpretation: <1.31 ft/sec, indicative of household ambulator    Posture / Balance Dynamic Sitting Balance Sitting balance - Comments: fair with static sitting, poor with dynamic sitting right lateral list noted Balance Overall balance assessment: Needs assistance Sitting-balance support: Feet supported Sitting balance-Leahy Scale: Fair Sitting balance - Comments: fair with static sitting, poor with dynamic sitting right lateral list noted Postural control: Right lateral lean Standing balance  support: Bilateral upper extremity supported Standing balance-Leahy Scale: Poor Standing balance comment: Continues to show flexed posture and poor stability    Special needs/care consideration BiPAP/CPAP  N/a CPM  N/a Continuous Drip IV n/a Dialysis n/a Life Vest n/a Oxygen n/a Special Bed n/a Trach Size n/a Wound Vac n/a Skin ecchymosis and laceration to head from fall on admission; ecchymosis to bilateral upper arms                           Bowel mgmt: incontinent LBM 06/17/18 Bladder mgmt: external catheter Diabetic mgmt n/a Sitter due to impulsive and decreased safety awareness    Previous Home Environment Living Arrangements: (Grandson and his wife live with patient)  Lives With: Family Available Help at Discharge: Family, Available 24 hours/day Type of Home: House Home Layout: One level Home Access: Stairs to enter Entrance Stairs-Rails: None Secretary/administrator of Steps: 2 Bathroom Shower/Tub: Hydrographic surveyor, Engineer, building services: Standard Bathroom Accessibility: Yes How Accessible: Accessible via walker Home Care Services: No  Discharge Living Setting Plans for Discharge Living Setting: Patient's home(pt's grandson and grandson's wife live with pt) Type of Home at Discharge: House Discharge Home Layout: One level Discharge Home Access: Stairs to enter Entrance Stairs-Rails: None Entrance Stairs-Number of Steps: 2 Discharge Bathroom Shower/Tub: Tub/shower unit, Curtain Discharge Bathroom Toilet: Standard Discharge Bathroom Accessibility: Yes How Accessible: Accessible via walker Does the patient have any problems obtaining your medications?: No  Social/Family/Support Systems Patient Roles: Parent(pt's children deceased; grandchildren are NOK) Contact Information: Primary is Ivin Booty and second is Insurance risk surveyor Anticipated Caregiver: Ivin Booty and his wife as well as Copywriter, advertising Information: see above; Ivin Booty is  primary Ability/Limitations of Caregiver: they will work around their schedule to provide 24/7 assist Caregiver Availability: 24/7 Discharge Plan Discussed with Primary Caregiver: Yes Is Caregiver In Agreement with Plan?: Yes Does Caregiver/Family have Issues with Lodging/Transportation while Pt is in Rehab?: No  Goals/Additional Needs Patient/Family Goal for Rehab: supervision to min  assist with PT, OT, and SLP Expected length of stay: ELOS 18 to 22 days Special Service Needs: decreased safety awareness; sitter in acute ICU  Pt/Family Agrees to Admission and willing to participate: Yes Program Orientation Provided & Reviewed with Pt/Caregiver Including Roles  & Responsibilities: Yes  Decrease burden of Care through IP rehab admission: n/a  Possible need for SNF placement upon discharge: not anticipated  Patient Condition: This patient's condition remains as documented in the consult dated 06/16/2018, in which the Rehabilitation Physician determined and documented that the patient's condition is appropriate for intensive rehabilitative care in an inpatient rehabilitation facility. Will admit to inpatient rehab today.  Preadmission Screen Completed By:  Clois DupesBoyette, Boyde Grieco Godwin, 06/18/2018 9:53 AM ______________________________________________________________________   Discussed status with Dr. Allena KatzPatel on 06/18/2018 at  [id number] and received telephone approval for admission today.  Admission Coordinator:  Clois DupesBoyette, Quentin Strebel Godwin, time [id number] Date 06/18/2018         Revision History

## 2018-06-18 NOTE — Progress Notes (Signed)
Physical Therapy Treatment Patient Details Name: Brynden F Perkey MRN: [id number] DOB: 11/26/1940 Elgie Collardoday's Date: 06/18/2018    History of Present Illness 77 y.o. male with a history of stroke who presents with ICH. He went to [facility] due to head injury where he was found to be hypertensive. A head CT was performed and demonstrates multiple areas of ICH including a significant hematoma in the left temporal lobe.     PT Comments    Mr. Christell ConstantMoore sleepy during session. Tolerable to mobility progression, but does continue to require Mod A +2 for all transfers and gait. Very unsteady gait pattern with forward flexed posture and unawareness of safety and surroundings. Making good progress towards goals. PT to continue to follow.    Follow Up Recommendations  CIR     Equipment Recommendations  (TBD)    Recommendations for Other Services Rehab consult     Precautions / Restrictions Precautions Precautions: Fall Restrictions Weight Bearing Restrictions: No    Mobility  Bed Mobility Overal bed mobility: Needs Assistance Bed Mobility: Supine to Sit     Supine to sit: Min assist     General bed mobility comments: Min A for LE management towards EOB; reduced initiation  Transfers Overall transfer level: Needs assistance Equipment used: Rolling walker (2 wheeled) Transfers: Sit to/from Stand Sit to Stand: Mod assist;+2 physical assistance         General transfer comment: Mod A to power up at bedside; upon initial standing demonstrating B knee buckle; able to recover with Mod A  Ambulation/Gait Ambulation/Gait assistance: Mod assist;+2 physical assistance;+2 safety/equipment Gait Distance (Feet): 75 Feet Assistive device: Rolling walker (2 wheeled) Gait Pattern/deviations: Step-to pattern;Step-through pattern;Decreased stride length;Shuffle;Trunk flexed;Drifts right/left Gait velocity: decreased   General Gait Details: very unsteady gait pattern; max cueing throughout session  for safety and stability; Mod A for physical assist for forward progression adn RW management as patient unaware of surroundings.    Stairs             Wheelchair Mobility    Modified Rankin (Stroke Patients Only) Modified Rankin (Stroke Patients Only) Pre-Morbid Rankin Score: No symptoms Modified Rankin: Moderately severe disability     Balance Overall balance assessment: Needs assistance Sitting-balance support: Feet supported Sitting balance-Leahy Scale: Fair     Standing balance support: Bilateral upper extremity supported;During functional activity Standing balance-Leahy Scale: Poor                              Cognition Arousal/Alertness: Lethargic Behavior During Therapy: WFL for tasks assessed/performed Overall Cognitive Status: Impaired/Different from baseline                       Memory: Decreased recall of precautions;Decreased short-term memory Following Commands: Follows one step commands with increased time;Follows one step commands inconsistently Safety/Judgement: Decreased awareness of safety;Decreased awareness of deficits   Problem Solving: Difficulty sequencing;Slow processing;Requires verbal cues;Requires tactile cues;Decreased initiation        Exercises      General Comments        Pertinent Vitals/Pain Pain Assessment: Faces Faces Pain Scale: No hurt    Home Living                      Prior Function            PT Goals (current goals can now be found in the care plan section) Acute Rehab  PT Goals Patient Stated Goal: to get better PT Goal Formulation: With patient/family Time For Goal Achievement: 06/29/18 Potential to Achieve Goals: Good Progress towards PT goals: Progressing toward goals    Frequency    Min 4X/week      PT Plan Current plan remains appropriate    Co-evaluation              AM-PAC PT "6 Clicks" Daily Activity  Outcome Measure  Difficulty turning over in  bed (including adjusting bedclothes, sheets and blankets)?: A Little Difficulty moving from lying on back to sitting on the side of the bed? : Unable Difficulty sitting down on and standing up from a chair with arms (e.g., wheelchair, bedside commode, etc,.)?: Unable Help needed moving to and from a bed to chair (including a wheelchair)?: A Lot Help needed walking in hospital room?: A Lot Help needed climbing 3-5 steps with a railing? : Total 6 Click Score: 10    End of Session Equipment Utilized During Treatment: Gait belt Activity Tolerance: Patient tolerated treatment well Patient left: in chair;with call bell/phone within reach;with chair alarm set;with nursing/sitter in room(posey belt reapplied per nursing) Nurse Communication: Mobility status PT Visit Diagnosis: Unsteadiness on feet (R26.81);History of falling (Z91.81);Muscle weakness (generalized) (M62.81);Other symptoms and signs involving the nervous system (R29.898)     Time: 1000-1024 PT Time Calculation (min) (ACUTE ONLY): 24 min  Charges:  $Gait Training: 8-22 mins $Therapeutic Activity: 8-22 mins                     Kipp Laurence, PT, DPT 06/18/18 11:24 AM Pager: [phone]

## 2018-06-19 ENCOUNTER — Inpatient Hospital Stay (HOSPITAL_COMMUNITY): Payer: Medicare Other | Admitting: Physical Therapy

## 2018-06-19 ENCOUNTER — Inpatient Hospital Stay (HOSPITAL_COMMUNITY): Payer: Medicare Other | Admitting: Speech Pathology

## 2018-06-19 ENCOUNTER — Inpatient Hospital Stay (HOSPITAL_COMMUNITY): Payer: Medicare Other

## 2018-06-19 DIAGNOSIS — I10 Essential (primary) hypertension: Secondary | ICD-10-CM

## 2018-06-19 DIAGNOSIS — E1165 Type 2 diabetes mellitus with hyperglycemia: Secondary | ICD-10-CM

## 2018-06-19 DIAGNOSIS — N183 Chronic kidney disease, stage 3 (moderate): Secondary | ICD-10-CM

## 2018-06-19 DIAGNOSIS — S069X2S Unspecified intracranial injury with loss of consciousness of 31 minutes to 59 minutes, sequela: Secondary | ICD-10-CM

## 2018-06-19 DIAGNOSIS — D62 Acute posthemorrhagic anemia: Secondary | ICD-10-CM

## 2018-06-19 LAB — CULTURE, BLOOD (ROUTINE X 2)
CULTURE: NO GROWTH
CULTURE: NO GROWTH
Special Requests: ADEQUATE

## 2018-06-19 LAB — COMPREHENSIVE METABOLIC PANEL
ALK PHOS: 59 U/L (ref 38–126)
ALT: 16 U/L (ref 0–44)
AST: 23 U/L (ref 15–41)
Albumin: 3.1 g/dL — ABNORMAL LOW (ref 3.5–5.0)
Anion gap: 9 (ref 5–15)
BILIRUBIN TOTAL: 0.7 mg/dL (ref 0.3–1.2)
BUN: 34 mg/dL — ABNORMAL HIGH (ref 8–23)
CO2: 27 mmol/L (ref 22–32)
CREATININE: 1.63 mg/dL — AB (ref 0.61–1.24)
Calcium: 8.7 mg/dL — ABNORMAL LOW (ref 8.9–10.3)
Chloride: 107 mmol/L (ref 98–111)
GFR calc non Af Amer: 39 mL/min — ABNORMAL LOW (ref >60)
GFR, EST AFRICAN AMERICAN: 45 mL/min — AB (ref >60)
GLUCOSE: 199 mg/dL — AB (ref 70–99)
Potassium: 3.7 mmol/L (ref 3.5–5.1)
SODIUM: 143 mmol/L (ref 135–145)
TOTAL PROTEIN: 5.8 g/dL — AB (ref 6.5–8.1)

## 2018-06-19 LAB — CBC WITH DIFFERENTIAL/PLATELET
ABS IMMATURE GRANULOCYTES: 0.1 10*3/uL (ref 0.0–0.1)
Basophils Absolute: 0.1 10*3/uL (ref 0.0–0.1)
Basophils Relative: 1 %
EOS ABS: 0.1 10*3/uL (ref 0.0–0.7)
Eosinophils Relative: 1 %
HEMATOCRIT: 31.2 % — AB (ref 39.0–52.0)
HEMOGLOBIN: 9.8 g/dL — AB (ref 13.0–17.0)
Immature Granulocytes: 1 %
Lymphocytes Relative: 20 %
Lymphs Abs: 1.9 10*3/uL (ref 0.7–4.0)
MCH: 29.4 pg (ref 26.0–34.0)
MCHC: 31.4 g/dL (ref 30.0–36.0)
MCV: 93.7 fL (ref 78.0–100.0)
Monocytes Absolute: 0.9 10*3/uL (ref 0.1–1.0)
Monocytes Relative: 9 %
NEUTROS PCT: 68 %
Neutro Abs: 6.5 10*3/uL (ref 1.7–7.7)
Platelets: 133 10*3/uL — ABNORMAL LOW (ref 150–400)
RBC: 3.33 MIL/uL — ABNORMAL LOW (ref 4.22–5.81)
RDW: 13.7 % (ref 11.5–15.5)
WBC: 9.5 10*3/uL (ref 4.0–10.5)

## 2018-06-19 LAB — GLUCOSE, CAPILLARY
Glucose-Capillary: 212 mg/dL — ABNORMAL HIGH (ref 70–99)
Glucose-Capillary: 184 mg/dL — ABNORMAL HIGH (ref 70–99)
Glucose-Capillary: 146 mg/dL — ABNORMAL HIGH (ref 70–99)
Glucose-Capillary: 157 mg/dL — ABNORMAL HIGH (ref 70–99)

## 2018-06-19 LAB — OCCULT BLOOD X 1 CARD TO LAB, STOOL: FECAL OCCULT BLD: NEGATIVE
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
## 2018-06-19 NOTE — Progress Notes (Signed)
Patient information reviewed and entered into eRehab system by Nickolis Diel, RN, CRRN, PPS Coordinator.  Information including medical coding and functional independence measure will be reviewed and updated through discharge.     Per nursing patient was given "Data Collection Information Summary for Patients in Inpatient Rehabilitation Facilities with attached "Privacy Act Statement-Health Care Records" upon admission.  

## 2018-06-19 NOTE — Progress Notes (Signed)
Resring most of shift w/o acute distress or discomfort, ,easily arousal, Family would like to be contact if there is any questions,or concern. Refer to assessment ata sheet

## 2018-06-19 NOTE — Progress Notes (Signed)
[facility] PHYSICAL MEDICINE & REHABILITATION     PROGRESS NOTE    Subjective/Complaints: Working with SLP. Slept well. Denies headache or pain. Hungry, didn't receive a tray this morning  ROS: Patient denies fever, rash, sore throat, blurred vision, nausea, vomiting, diarrhea, cough, shortness of breath or chest pain, joint or back pain, headache, or mood change.   Objective:  No results found. Recent Labs    06/18/18 0429 06/19/18 0324  WBC 9.6 9.5  HGB 10.2* 9.8*  HCT 31.9* 31.2*  PLT 127* 133*   Recent Labs    06/18/18 0429 06/19/18 0324  NA 142 143  K 3.8 3.7  CL 104 107  GLUCOSE 232* 199*  BUN 35* 34*  CREATININE 1.65* 1.63*  CALCIUM 8.7* 8.7*   CBG (last 3)  Recent Labs    06/18/18 1704 06/18/18 2209 06/19/18 0650  GLUCAP 109* 212* 184*    Wt Readings from Last 3 Encounters:  06/18/18 79.6 kg (175 lb 7.8 oz)  06/13/18 78.5 kg (173 lb 1 oz)  05/19/17 79.4 kg (175 lb)     Intake/Output Summary (Last 24 hours) at 06/19/2018 0953 Last data filed at 06/19/2018 0615 Gross per 24 hour  Intake -  Output 275 ml  Net -275 ml    Vital Signs: Blood pressure (!) 163/81, pulse 60, temperature 99.6 F (37.6 C), temperature source Oral, resp. rate 18, height 5\' 7"  (1.702 m), weight 79.6 kg (175 lb 7.8 oz), SpO2 94 %. Physical Exam:  Constitutional: No distress . Vital signs reviewed. HEENT: EOMI, oral membranes moist, poor dentition, upper dentures, bruising/scab left scalp/neck Neck: supple Cardiovascular: RRR without murmur. No JVD    Respiratory: CTA Bilaterally without wheezes or rales. Normal effort    GI: BS +, non-tender, non-distended  Musculoskeletal:  No edema or tenderness in extremities  Neurological: He is alert. Could tell me he was at [facility] health--perseverated on that though Makes good eye contact.   Follows commands inconsistently Garbled speech, word finding deficits Initiating with RUE with cueing RLE hyperrefexia  Skin: Skin is warm  and dry.  Psychiatric:  Flat, cooperative     Assessment/Plan: 1. Functional deficits secondary to TBI which require 3+ hours per day of interdisciplinary therapy in a comprehensive inpatient rehab setting. Physiatrist is providing close team supervision and 24 hour management of active medical problems listed below. Physiatrist and rehab team continue to assess barriers to discharge/monitor patient progress toward functional and medical goals.  Function:  Bathing Bathing position      Bathing parts      Bathing assist        Upper Body Dressing/Undressing Upper body dressing                    Upper body assist        Lower Body Dressing/Undressing Lower body dressing                                  Lower body assist        Toileting Toileting Toileting activity did not occur: No continent bowel/bladder event        Toileting assist     Transfers Chair/bed transfer Chair/bed transfer activity did not occur: Safety/medical concerns           Lobbyist  Cognition Comprehension Comprehension assist level: Understands basic 25 - 49% of the time/ requires cueing 50 - 75% of the time  Expression Expression assist level: Expresses basic 25 - 49% of the time/requires cueing 50 - 75% of the time. Uses single words/gestures.  Social Interaction Social Interaction assist level: Interacts appropriately 50 - 74% of the time - May be physically or verbally inappropriate.  Problem Solving Problem solving assist level: Solves basic 25 - 49% of the time - needs direction more than half the time to initiate, plan or complete simple activities  Memory Memory assist level: Recognizes or recalls 25 - 49% of the time/requires cueing 50 - 75% of the time   Medical Problem List and Plan: 1.  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  DVT  Prophylaxis/Anticoagulation: Subcutaneous heparin initiated 06/17/2018.  Monitor for any bleeding episodes 3. Pain Management: Tylenol as needed 4. Mood: Aricept 10 mg nightly, Xanax 0.125 mg nightly, Zoloft 100 mg daily, Seroquel 12.5 mg twice daily 5. Neuropsych: This patient is not capable of making decisions on his own behalf. 6. Skin/Wound Care: Routine skin checks  -local care to scalp as needed  -remove staples soon 7. Fluids/Electrolytes/Nutrition: encourage PO    -I personally reviewed the patient's labs today.   8.  Dysphasia.  Dysphasia #1 honey thick liquid.  Follow-up speech therapy 9.  Hypertension.  Clonidine 0.3 mg TID, hydralazine 50 mg every 8 hours, Norvasc 10 mg daily.   -fair control at present--follow 10.  Diabetes mellitus.  Hemoglobin A1c 7.0.  Glucotrol 5 mg daily, Glucophage 1000 mg twice daily.    -poor control thus far  -adjust regimen as needed 11.  CKD stage III.  Creatinine baseline 2.07. (34/1.53 today) 12.  BPH.  Flomax 0.8 mg daily.  Check PVR x3 13.  CAD with CABG 2003.  Aspirin currently on hold due to left temporal lobe hemorrhage.  No chest pain or shortness of breath 13. ABLA: hgb 9.8 today and appears to be trending down   -check stool for OB   LOS (Days) 1 A FACE TO FACE EVALUATION WAS PERFORMED  Ranelle OysterZachary T Angus Amini, MD 06/19/2018 9:53 AM

## 2018-06-20 ENCOUNTER — Inpatient Hospital Stay (HOSPITAL_COMMUNITY): Payer: Medicare Other | Admitting: Physical Therapy

## 2018-06-20 ENCOUNTER — Inpatient Hospital Stay (HOSPITAL_COMMUNITY): Payer: Medicare Other

## 2018-06-20 ENCOUNTER — Inpatient Hospital Stay (HOSPITAL_COMMUNITY): Payer: Medicare Other | Admitting: Speech Pathology

## 2018-06-20 DIAGNOSIS — R509 Fever, unspecified: Secondary | ICD-10-CM

## 2018-06-20 LAB — URINALYSIS, COMPLETE (UACMP) WITH MICROSCOPIC
BILIRUBIN URINE: NEGATIVE
Bacteria, UA: NONE SEEN
Glucose, UA: 50 mg/dL — AB
HGB URINE DIPSTICK: NEGATIVE
Ketones, ur: NEGATIVE mg/dL
LEUKOCYTES UA: NEGATIVE
NITRITE: NEGATIVE
PROTEIN: NEGATIVE mg/dL
Specific Gravity, Urine: 1.019 (ref 1.005–1.030)
pH: 5 (ref 5.0–8.0)

## 2018-06-20 LAB — CBC
HCT: 32.1 % — ABNORMAL LOW (ref 39.0–52.0)
Hemoglobin: 10.2 g/dL — ABNORMAL LOW (ref 13.0–17.0)
MCH: 29.5 pg (ref 26.0–34.0)
MCHC: 31.8 g/dL (ref 30.0–36.0)
MCV: 92.8 fL (ref 78.0–100.0)
PLATELETS: 148 10*3/uL — AB (ref 150–400)
RBC: 3.46 MIL/uL — ABNORMAL LOW (ref 4.22–5.81)
RDW: 13.2 % (ref 11.5–15.5)
WBC: 9.9 10*3/uL (ref 4.0–10.5)

## 2018-06-20 LAB — GLUCOSE, CAPILLARY
GLUCOSE-CAPILLARY: 129 mg/dL — AB (ref 70–99)
Glucose-Capillary: 144 mg/dL — ABNORMAL HIGH (ref 70–99)
Glucose-Capillary: 178 mg/dL — ABNORMAL HIGH (ref 70–99)
Glucose-Capillary: 167 mg/dL — ABNORMAL HIGH (ref 70–99)
Glucose-Capillary: 138 mg/dL — ABNORMAL HIGH (ref 70–99)

## 2018-06-20 LAB — BASIC METABOLIC PANEL
ANION GAP: 9 (ref 5–15)
BUN: 33 mg/dL — ABNORMAL HIGH (ref 8–23)
CHLORIDE: 106 mmol/L (ref 98–111)
CO2: 26 mmol/L (ref 22–32)
Calcium: 8.6 mg/dL — ABNORMAL LOW (ref 8.9–10.3)
Creatinine, Ser: 1.5 mg/dL — ABNORMAL HIGH (ref 0.61–1.24)
GFR calc Af Amer: 50 mL/min — ABNORMAL LOW (ref >60)
GFR calc non Af Amer: 43 mL/min — ABNORMAL LOW (ref >60)
GLUCOSE: 181 mg/dL — AB (ref 70–99)
POTASSIUM: 3.5 mmol/L (ref 3.5–5.1)
SODIUM: 141 mmol/L (ref 135–145)

## 2018-06-20 NOTE — Care Management (Signed)
Inpatient Rehabilitation Center Individual Statement of Services  Patient Name:  Mark Montoya  Date:  06/20/2018  Welcome to the Inpatient Rehabilitation Center.  Our goal is to provide you with an individualized program based on your diagnosis and situation, designed to meet your specific needs.  With this comprehensive rehabilitation program, you will be expected to participate in at least 3 hours of rehabilitation therapies Monday-Friday, with modified therapy programming on the weekends.  Your rehabilitation program will include the following services:  Physical Therapy (PT), Occupational Therapy (OT), Speech Therapy (ST), 24 hour per day rehabilitation nursing, Therapeutic Recreaction (TR), Neuropsychology, Case Management (Social Worker), Rehabilitation Medicine, Nutrition Services and Pharmacy Services  Weekly team conferences will be held on Tuesdays to discuss your progress.  Your Social Worker will talk with you frequently to get your input and to update you on team discussions.  Team conferences with you and your family in attendance may also be held.  Expected length of stay: 21 days   Overall anticipated outcome: supervision to minimal assistance  Depending on your progress and recovery, your program may change. Your Social Worker will coordinate services and will keep you informed of any changes. Your Social Worker's name and contact numbers are listed  below.  The following services may also be recommended but are not provided by the Inpatient Rehabilitation Center:   Driving Evaluations  Home Health Rehabiltiation Services  Outpatient Rehabilitation Services   Arrangements will be made to provide these services after discharge if needed.  Arrangements include referral to agencies that provide these services.  Your insurance has been verified to be:  [facility] Your primary doctor is:  Sudie BaileyKnowlton  Pertinent information will be shared with your doctor and your insurance  company.  Social Worker:  [place]Lucy Thurston [place], [place] [phone] or ([phone]) [phone]   Information discussed with and copy given to patient by: Amada JupiterHOYLE, Cherice Glennie, 06/20/2018, 4:41 PM

## 2018-06-20 NOTE — Progress Notes (Addendum)
Physical Therapy Session Note  Patient Details  Name: Mark Montoya MRN: [id number] Date of Birth: 09-Feb-1941  Today's Date: 06/20/2018 PT Individual Time: 1100-1200 AND 1630-1700 PT Individual Time Calculation (min): 60 min AND 30 min   Short Term Goals: Week 1:  PT Short Term Goal 1 (Week 1): Pt will consistenty perform bed mobility with mod assist consistently  PT Short Term Goal 2 (Week 1): Pt will transfer to [facility] with mod assist consistently  PT Short Term Goal 3 (Week 1): Pt will ambulate >142f with mod assist and LRAD  PT Short Term Goal 4 (Week 1): pt will maintain standing balance with min assist x 2 min   Skilled Therapeutic Interventions/Progress Updates:   Pt received sitting in WC and agreeable to PT. [facility] Gait training with RW x 1554fwith min-mod assist from PT with max cues for attention to task, posture, AD management in turns through door. Standing balance training to perform reaching task to the R. Max cues for attention to the R and improved weight shifting to the R with use of ankle strategy to prevent LOB. Forced use of UE to perform ball toss 2 x 1 min; max cues for attention to task and use of RUE>LUE. Problem solving and RUE coordination training to build pipe tree and max cues for attention to task and awareness of errors. Sit<>stand transfers with moderate assist throughout treatment with and without AD. [facility] and all needs met.    Session 2.  Pt received supine in bed and agreeable to PT. Supine>sit transfer with moderate assist and cues. Gait training through hall of rehab unit x 20025fith min-mod assist from PT for Ad management, posture, and safety. Pt returned to room and performed ambulatory transfer to bed with min assist. Sit>supine completed with min assist, and left supine in bed with call bell in reach and all needs met.          Therapy  Documentation Precautions:  Precautions Precautions: Fall Restrictions Weight Bearing Restrictions: No Pain: Faces: none  See Function Navigator for Current Functional Status.   Therapy/Group: Individual Therapy  AusLorie Phenix2/2019, 2:06 PM
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
## 2018-06-20 NOTE — Progress Notes (Signed)
Occupational Therapy Session Note  Patient Details  Name: Mark Montoya MRN: [id number] Date of Birth: June 11, 1941  Today's Date: 06/20/2018 OT Individual Time: [id number] OT Individual Time Calculation (min): 57 min    Short Term Goals: Week 1:  OT Short Term Goal 1 (Week 1): Pt will complete BSC/toilet transfer with MAX A OT Short Term Goal 2 (Week 1): Pt will locate 3/5 needed ADL items on R side of sink wiht min VC OT Short Term Goal 3 (Week 1): Pt will sit to stand wiht MOD A in prep for clothing managment OT Short Term Goal 4 (Week 1): pt will complete 2/4 steps of donning shirt  Skilled Therapeutic Interventions/Progress Updates:    1:1. Pt lethargic during first half of session,however once up in chair arousal improved. Pt completes rolling B to wash peri area and change brief. Pt washes BLE with HOH A to initate washing and bring feet to figure 4 to reach feel. OT dons pants with total A bridging hips for clothing management. Pt completes stand pivot transfer with HHA to L with MOD A for balance and VC for hand placement. Pt requries significantly increased time to wash face, originally washing hand with wash cloths requiring multimodal cues to locate items on sink/faucet. Pt able to don shirt with A to thread R sleeve. Exited session with pt seated inw/c, belt alarm on and call light in reach  Therapy Documentation Precautions:  Precautions Precautions: Fall Restrictions Weight Bearing Restrictions: No General:    See Function Navigator for Current Functional Status.   Therapy/Group: Individual Therapy  [name] 06/20/2018, 10:14 AM

## 2018-06-20 NOTE — Progress Notes (Signed)
Speech Language Pathology Daily Session Note  Patient Details  Name: Mark Montoya MRN: [id number] Date of Birth: [birth year]  Today's Date: 06/20/2018 SLP Individual Time: 0735-0830 SLP Individual Time Calculation (min): 55 min  Short Term Goals: Week 1: SLP Short Term Goal 1 (Week 1): Patient will consume current diet with minimal overt s/s of aspiration with Min A verbal cues for use of swallowing strategies.  SLP Short Term Goal 2 (Week 1): Patient will demonstrate sustained attention to functional tasks for 10 minutes with Mod A verbal cues for redirection.  SLP Short Term Goal 3 (Week 1): Patient will self-monitor verbal errors at the phrase level with Mod A multimodal cues.   Skilled Therapeutic Interventions: Skilled treatment session focused on dysphagia and speech goals. SLP facilitated session by providing Mod-Max A verbal cues for patient to utilize a slow rate of self-feeding and small bites/sips with breakfast meal of Dys. 1 textures with honey-thick liquids. Patient demonstrated intermittent subtle throat clearing, suspect due to increased attempts to speak to clinician with a full oral cavity and overall decreased attention to task. Patient required total A to naming functional items but demonstrated increased "islands" of intelligible speech during an informal conversation that is within a context. Patient continues to demonstrate intermittent awareness of errors but requires total A to self-correct. Patient required Mod A verbal cues for problem solving during self-feeding and tray set-up. Patient left upright in bed with alarm on and all needs within reach. Continue with current plan of care.      Function:  Eating Eating   Modified Consistency Diet: Yes Eating Assist Level: Set up assist for;Supervision or verbal cues;Helper checks for pocketed food;More than reasonable amount of time   Eating Set Up Assist For: Opening containers       Cognition Comprehension  Comprehension assist level: Understands basic 50 - 74% of the time/ requires cueing 25 - 49% of the time  Expression   Expression assist level: Expresses basic 25 - 49% of the time/requires cueing 50 - 75% of the time. Uses single words/gestures.  Social Interaction Social Interaction assist level: Interacts appropriately 50 - 74% of the time - May be physically or verbally inappropriate.  Problem Solving Problem solving assist level: Solves basic 25 - 49% of the time - needs direction more than half the time to initiate, plan or complete simple activities  Memory Memory assist level: Recognizes or recalls 25 - 49% of the time/requires cueing 50 - 75% of the time    Pain No/Denies Pain   Therapy/Group: Individual Therapy  Chester Romero 06/20/2018, 12:46 PM

## 2018-06-20 NOTE — Plan of Care (Signed)
  Problem: RH BOWEL ELIMINATION Goal: RH STG MANAGE BOWEL WITH ASSISTANCE Description STG Manage Bowel with mod  Assistance.  Outcome: Progressing Goal: RH STG MANAGE BOWEL W/MEDICATION W/ASSISTANCE Description STG Manage Bowel with Medication with min  Assistance.  Outcome: Progressing Goal: RH STG MANAGE BOWEL W/EQUIPMENT W/ASSISTANCE Description STG Manage Bowel With Equipment With min Assistance  Outcome: Progressing Goal: RH OTHER STG BOWEL ELIMINATION GOALS W/ASSIST Description Other STG Bowel Elimination Goals With Assistance. Outcome: Progressing   Problem: RH BLADDER ELIMINATION Goal: RH STG MANAGE BLADDER WITH ASSISTANCE Description STG Manage Bladder With min  Assistance  Outcome: Progressing Goal: RH STG MANAGE BLADDER WITH MEDICATION WITH ASSISTANCE Description STG Manage Bladder With Medication With min Assistance.  Outcome: Progressing Goal: RH STG MANAGE BLADDER WITH EQUIPMENT WITH ASSISTANCE Description STG Manage Bladder With Equipment With Assistance Outcome: Progressing Goal: RH OTHER STG BLADDER ELIMINATION GOALS W/ASSIST Description Other STG Bladder Elimination Goals With Assistance Outcome: Progressing   Problem: RH SKIN INTEGRITY Goal: RH OTHER STG SKIN INTEGRITY GOALS W/ASSIST Description Other STG Skin Integrity Goals With min  Assistance.  Outcome: Progressing   Problem: RH SKIN INTEGRITY Goal: RH STG SKIN FREE OF INFECTION/BREAKDOWN Outcome: Not Progressing Goal: RH STG MAINTAIN SKIN INTEGRITY WITH ASSISTANCE Description STG Maintain Skin Integrity With min Assistance.  Outcome: Not Progressing   Problem: RH SAFETY Goal: RH STG ADHERE TO SAFETY PRECAUTIONS W/ASSISTANCE/DEVICE Description STG Adhere to Safety Precautions With min Assistance/Device.  Outcome: Not Progressing Goal: RH STG DECREASED RISK OF FALL WITH ASSISTANCE Description STG Decreased Risk of Fall With min Assistance.  Outcome: Not Progressing Goal: RH STG DEMO  UNDERSTANDING HOME SAFETY PRECAUTIONS Outcome: Not Progressing Goal: RH OTHER STG SAFETY GOALS W/ASSIST Description Other STG Safety Goals With Assistance. Outcome: Not Progressing   Problem: RH KNOWLEDGE DEFICIT GENERAL Goal: RH STG INCREASE KNOWLEDGE OF SELF CARE AFTER HOSPITALIZATION Outcome: Not Progressing   Problem: Consults Goal: RH BRAIN INJURY PATIENT EDUCATION Description Description: See Patient Education module for eduction specifics Outcome: Not Progressing

## 2018-06-20 NOTE — Progress Notes (Signed)
PHYSICAL MEDICINE & REHABILITATION     PROGRESS NOTE    Subjective/Complaints: Up with SLP. Eating breakfast. Denies cough, fever. Has developed low grade temp over the last 24 hours.   ROS: limited due to language/communication   Objective:  No results found. Recent Labs    06/19/18 0324 06/20/18 0556  WBC 9.5 9.9  HGB 9.8* 10.2*  HCT 31.2* 32.1*  PLT 133* 148*   Recent Labs    06/19/18 0324 06/20/18 0556  NA 143 141  K 3.7 3.5  CL 107 106  GLUCOSE 199* 181*  BUN 34* 33*  CREATININE 1.63* 1.50*  CALCIUM 8.7* 8.6*   CBG (last 3)  Recent Labs    06/19/18 1125 06/19/18 2123 06/20/18 0654  GLUCAP 146* 157* 178*    Wt Readings from Last 3 Encounters:  06/18/18 79.6 kg (175 lb 7.8 oz)  06/13/18 78.5 kg (173 lb 1 oz)  05/19/17 79.4 kg (175 lb)     Intake/Output Summary (Last 24 hours) at 06/20/2018 1042 Last data filed at 06/19/2018 1700 Gross per 24 hour  Intake 720 ml  Output -  Net 720 ml    Vital Signs: Blood pressure (!) 188/76, pulse (!) 59, temperature 100.3 F (37.9 C), temperature source Oral, resp. rate 18, height 5\' 7"  (1.702 m), weight 79.6 kg (175 lb 7.8 oz), SpO2 95 %. Physical Exam:  Constitutional: No distress . Vital signs reviewed. HEENT: EOMI, oral membranes moist Neck: supple Cardiovascular: RRR without murmur. No JVD    Respiratory: CTA Bilaterally without wheezes or rales. Normal effort    GI: BS +, non-tender, non-distended  Musculoskeletal:  No edema or tenderness in extremities  Neurological: He is alert.  Garbled speech.   Follows commands inconsistently Using right hand to feed.  RLE hyperrefexia  Skin: Skin is warm and dry.  Psychiatric:  Flat, cooperative     Assessment/Plan: 1. Functional deficits secondary to TBI which require 3+ hours per day of interdisciplinary therapy in a comprehensive inpatient rehab setting. Physiatrist is providing close team supervision and 24 hour management of active medical  problems listed below. Physiatrist and rehab team continue to assess barriers to discharge/monitor patient progress toward functional and medical goals.  Function:  Bathing Bathing position      Bathing parts      Bathing assist        Upper Body Dressing/Undressing Upper body dressing   What is the patient wearing?: Pull over shirt/dress     Pull over shirt/dress - Perfomed by patient: Thread/unthread left sleeve Pull over shirt/dress - Perfomed by helper: Thread/unthread right sleeve, Put head through opening, Pull shirt over trunk        Upper body assist        Lower Body Dressing/Undressing Lower body dressing   What is the patient wearing?: Pants, Ted Hose, Non-skid slipper socks       Pants- Performed by helper: Thread/unthread right pants leg, Thread/unthread left pants leg, Pull pants up/down   Non-skid slipper socks- Performed by helper: Don/doff right sock, Don/doff left sock               TED Hose - Performed by helper: Don/doff right TED hose, Don/doff left TED hose  Lower body assist        Toileting Toileting Toileting activity did not occur: No continent bowel/bladder event   Toileting steps completed by helper: Adjust clothing after toileting, Performs perineal hygiene, Adjust clothing prior to toileting    Toileting  assist     Transfers Chair/bed transfer Chair/bed transfer activity did not occur: Safety/medical concerns Chair/bed transfer method: Stand pivot Chair/bed transfer assist level: Maximal assist (Pt 25 - 49%/lift and lower) Chair/bed transfer assistive device: Armrests     Locomotion Ambulation     Max distance: 50 Assist level: Moderate assist (Pt 50 - 74%)   Wheelchair   Type: Manual Max wheelchair distance: 15150ft Assist Level: Touching or steadying assistance (Pt > 75%), Supervision or verbal cues  Cognition Comprehension Comprehension assist level: Understands basic 50 - 74% of the time/ requires cueing 25 - 49%  of the time  Expression Expression assist level: Expresses basic 25 - 49% of the time/requires cueing 50 - 75% of the time. Uses single words/gestures.  Social Interaction Social Interaction assist level: Interacts appropriately 50 - 74% of the time - May be physically or verbally inappropriate.  Problem Solving Problem solving assist level: Solves basic 25 - 49% of the time - needs direction more than half the time to initiate, plan or complete simple activities  Memory Memory assist level: Recognizes or recalls 25 - 49% of the time/requires cueing 50 - 75% of the time   Medical Problem List and Plan: 1.  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  DVT Prophylaxis/Anticoagulation: Subcutaneous heparin initiated 06/17/2018.  Monitor for any bleeding episodes 3. Pain Management: Tylenol as needed 4. Mood: Aricept 10 mg nightly, Xanax 0.125 mg nightly, Zoloft 100 mg daily, Seroquel 12.5 mg twice daily 5. Neuropsych: This patient is not capable of making decisions on his own behalf. 6. Skin/Wound Care: Routine skin checks  -local care to scalp as needed  -remove staples soon 7. Fluids/Electrolytes/Nutrition: encourage PO    -I personally reviewed the patient's labs today.   \ -continue to push fluids     8.  Dysphasia.  Dysphasia #1 honey thick liquid.  Follow-up speech therapy 9.  Hypertension.  Clonidine 0.3 mg TID, hydralazine 50 mg every 8 hours, Norvasc 10 mg daily.   -controlled at present 10.  Diabetes mellitus.  Hemoglobin A1c 7.0.  Glucotrol 5 mg daily, Glucophage 1000 mg twice daily.    -some improvement over last 24 hours  -hold on changes at present  -cover with SSI 11.  CKD stage III.  Creatinine baseline 2.07. (33/1.5 today) 12.  BPH.  Flomax 0.8 mg daily.    13.  CAD with CABG 2003.  Aspirin currently on hold due to left temporal lobe hemorrhage.  No chest pain or shortness of breath 13. ABLA: up to 10.2  today   -stool OB negative   -continue to follow 14. Low grade temp:  -check ua, ucx  -check cxr today  -IS   LOS (Days) 2 A FACE TO FACE EVALUATION WAS PERFORMED  Ranelle OysterZachary T Isao Seltzer, MD 06/20/2018 10:42 AM

## 2018-06-21 ENCOUNTER — Inpatient Hospital Stay (HOSPITAL_COMMUNITY): Payer: Medicare Other

## 2018-06-21 ENCOUNTER — Inpatient Hospital Stay (HOSPITAL_COMMUNITY): Payer: Medicare Other | Admitting: Physical Therapy

## 2018-06-21 DIAGNOSIS — E119 Type 2 diabetes mellitus without complications: Secondary | ICD-10-CM

## 2018-06-21 DIAGNOSIS — I610 Nontraumatic intracerebral hemorrhage in hemisphere, subcortical: Secondary | ICD-10-CM

## 2018-06-21 DIAGNOSIS — R1312 Dysphagia, oropharyngeal phase: Secondary | ICD-10-CM

## 2018-06-21 LAB — GLUCOSE, CAPILLARY
GLUCOSE-CAPILLARY: 169 mg/dL — AB (ref 70–99)
GLUCOSE-CAPILLARY: 158 mg/dL — AB (ref 70–99)
Glucose-Capillary: 141 mg/dL — ABNORMAL HIGH (ref 70–99)
Glucose-Capillary: 156 mg/dL — ABNORMAL HIGH (ref 70–99)

## 2018-06-21 LAB — URINE CULTURE: Culture: NO GROWTH

## 2018-06-21 NOTE — Progress Notes (Signed)
[facility] PHYSICAL MEDICINE & REHABILITATION     PROGRESS NOTE    Subjective/Complaints: Patient in bed.  Anxious to get home.  ROS: limited due to language/communication    Objective:  Dg Chest 2 View  Result Date: 06/20/2018 CLINICAL DATA:  77 year old male with a history of fever EXAM: CHEST - 2 VIEW COMPARISON:  06/15/2018 FINDINGS: Cardiomediastinal silhouette unchanged in size and contour. Surgical changes of median sternotomy and CABG. Calcifications of the aortic arch. Low lung volumes. No evidence of central vascular congestion or interlobular septal thickening. Coarsened interstitial markings similar to prior. No pleural effusion on the lateral view.  No pneumothorax. Asymmetric elevation of left hemidiaphragm, similar to prior. No confluent airspace disease. IMPRESSION: Low volumes and chronic lung changes without evidence of superimposed acute cardiopulmonary disease. Surgical changes of median sternotomy and CABG. Electronically Signed   By: Gilmer Mor D.O.   On: 06/20/2018 13:11   Recent Labs    06/19/18 0324 06/20/18 0556  WBC 9.5 9.9  HGB 9.8* 10.2*  HCT 31.2* 32.1*  PLT 133* 148*   Recent Labs    06/19/18 0324 06/20/18 0556  NA 143 141  K 3.7 3.5  CL 107 106  GLUCOSE 199* 181*  BUN 34* 33*  CREATININE 1.63* 1.50*  CALCIUM 8.7* 8.6*   CBG (last 3)  Recent Labs    06/20/18 1653 06/20/18 2126 06/21/18 0704  GLUCAP 138* 129* 169*    Wt Readings from Last 3 Encounters:  06/18/18 79.6 kg (175 lb 7.8 oz)  06/13/18 78.5 kg (173 lb 1 oz)  05/19/17 79.4 kg (175 lb)     Intake/Output Summary (Last 24 hours) at 06/21/2018 0809 Last data filed at 06/20/2018 2100 Gross per 24 hour  Intake 360 ml  Output 100 ml  Net 260 ml    Vital Signs: Blood pressure (!) 156/76, pulse 63, temperature 98.6 F (37 C), temperature source Oral, resp. rate 16, height 5\' 7"  (1.702 m), weight 79.6 kg (175 lb 7.8 oz), SpO2 94 %. Physical Exam:  Constitutional: No  distress . Vital signs reviewed. HEENT: EOMI, oral membranes moist Neck: supple Cardiovascular: RRR without murmur. No JVD    Respiratory: CTA Bilaterally without wheezes or rales. Normal effort    GI: BS +, non-tender, non-distended   Musculoskeletal:  No edema or tenderness in extremities  Neurological: He is alert.  A phasic, garbled speech but able to communicate thoughts at times.  Follows commands inconsistently  right inattention.  RLE hyperrefexia  Skin: Scalp wound with scab, dried blood on pillow.  Psychiatric:  Flat, cooperative     Assessment/Plan: 1. Functional deficits secondary to TBI which require 3+ hours per day of interdisciplinary therapy in a comprehensive inpatient rehab setting. Physiatrist is providing close team supervision and 24 hour management of active medical problems listed below. Physiatrist and rehab team continue to assess barriers to discharge/monitor patient progress toward functional and medical goals.  Function:  Bathing Bathing position      Bathing parts Body parts bathed by patient: Right upper leg, Left upper leg, Front perineal area Body parts bathed by helper: Right lower leg, Left lower leg, Buttocks(LB only)  Bathing assist        Upper Body Dressing/Undressing Upper body dressing   What is the patient wearing?: Pull over shirt/dress     Pull over shirt/dress - Perfomed by patient: Thread/unthread left sleeve, Put head through opening, Pull shirt over trunk Pull over shirt/dress - Perfomed by helper: Thread/unthread right sleeve  Upper body assist        Lower Body Dressing/Undressing Lower body dressing   What is the patient wearing?: Pants, Ted Hose, Non-skid slipper socks       Pants- Performed by helper: Thread/unthread right pants leg, Thread/unthread left pants leg, Pull pants up/down   Non-skid slipper socks- Performed by helper: Don/doff right sock, Don/doff left sock               TED Hose -  Performed by helper: Don/doff right TED hose, Don/doff left TED hose  Lower body assist        Toileting Toileting Toileting activity did not occur: No continent bowel/bladder event   Toileting steps completed by helper: Adjust clothing prior to toileting, Performs perineal hygiene, Adjust clothing after toileting    Toileting assist Assist level: Two helpers   Transfers Chair/bed transfer Chair/bed transfer activity did not occur: Safety/medical concerns Chair/bed transfer method: Stand pivot Chair/bed transfer assist level: Moderate assist (Pt 50 - 74%/lift or lower) Chair/bed transfer assistive device: Armrests     Locomotion Ambulation     Max distance: 50 Assist level: Moderate assist (Pt 50 - 74%)   Wheelchair   Type: Manual Max wheelchair distance: 13950ft Assist Level: Touching or steadying assistance (Pt > 75%), Supervision or verbal cues  Cognition Comprehension Comprehension assist level: Understands basic 50 - 74% of the time/ requires cueing 25 - 49% of the time  Expression Expression assist level: Expresses basic 25 - 49% of the time/requires cueing 50 - 75% of the time. Uses single words/gestures.  Social Interaction Social Interaction assist level: Interacts appropriately 50 - 74% of the time - May be physically or verbally inappropriate.  Problem Solving Problem solving assist level: Solves basic 25 - 49% of the time - needs direction more than half the time to initiate, plan or complete simple activities  Memory Memory assist level: Recognizes or recalls 25 - 49% of the time/requires cueing 50 - 75% of the time   Medical Problem List and Plan: 1.  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  DVT Prophylaxis/Anticoagulation: Subcutaneous heparin initiated 06/17/2018.  Monitor for any bleeding episodes 3. Pain Management: Tylenol as needed 4. Mood: Aricept 10 mg nightly, Xanax 0.125 mg nightly, Zoloft  100 mg daily, Seroquel 12.5 mg twice daily 5. Neuropsych: This patient is not capable of making decisions on his own behalf. 6. Skin/Wound Care: Routine skin checks  -local care to scalp as needed  -remove staples soon once he stops picking at area 7. Fluids/Electrolytes/Nutrition: encourage PO     \ -continue to push fluids     8.  Dysphasia.  Dysphasia #1 honey thick liquid.  Follow-up speech therapy 9.  Hypertension.  Clonidine 0.3 mg TID, hydralazine 50 mg every 8 hours, Norvasc 10 mg daily.   -controlled at present 10.  Diabetes mellitus.  Hemoglobin A1c 7.0.  Glucotrol 5 mg daily, Glucophage 1000 mg twice daily.    -some improvement over last 24 hours  -hold on changes at present  -cover with SSI 11.  CKD stage III.  Creatinine baseline 2.07. (33/1.5 today) 12.  BPH.  Flomax 0.8 mg daily.    13.  CAD with CABG 2003.  Aspirin currently on hold due to left temporal lobe hemorrhage.  No chest pain or shortness of breath 13. ABLA: up to 10.2 today   -stool OB negative   -continue to follow 14. Low grade temp:  Afebrile this morning  -Urinalysis negative, urine culture pending  -Chest x-ray reviewed and without acute findings  -Continue incentive spirometry and observe  LOS (Days) 3 A FACE TO FACE EVALUATION WAS PERFORMED  Ranelle Oyster, MD 06/21/2018 8:09 AM

## 2018-06-21 NOTE — Plan of Care (Signed)
  Problem: Consults Goal: RH GENERAL PATIENT EDUCATION Description See Patient Education module for education specifics. Outcome: Not Progressing Goal: Skin Care Protocol Initiated - if Braden Score 18 or less Description If consults are not indicated, leave blank or document N/A Outcome: Not Progressing Goal: Nutrition Consult-if indicated Outcome: Not Progressing Goal: Diabetes Guidelines if Diabetic/Glucose > 140 Description If diabetic or lab glucose is > 140 mg/dl - Initiate Diabetes/Hyperglycemia Guidelines & Document Interventions  Outcome: Not Progressing   Problem: RH BOWEL ELIMINATION Goal: RH STG MANAGE BOWEL WITH ASSISTANCE Description STG Manage Bowel with mod  Assistance.  Outcome: Not Progressing Goal: RH STG MANAGE BOWEL W/MEDICATION W/ASSISTANCE Description STG Manage Bowel with Medication with min  Assistance.  Outcome: Not Progressing Goal: RH STG MANAGE BOWEL W/EQUIPMENT W/ASSISTANCE Description STG Manage Bowel With Equipment With min Assistance  Outcome: Not Progressing Goal: RH OTHER STG BOWEL ELIMINATION GOALS W/ASSIST Description Other STG Bowel Elimination Goals With Assistance. Outcome: Not Progressing   Problem: RH BLADDER ELIMINATION Goal: RH STG MANAGE BLADDER WITH ASSISTANCE Description STG Manage Bladder With min  Assistance  Outcome: Not Progressing Goal: RH STG MANAGE BLADDER WITH MEDICATION WITH ASSISTANCE Description STG Manage Bladder With Medication With min Assistance.  Outcome: Not Progressing Goal: RH STG MANAGE BLADDER WITH EQUIPMENT WITH ASSISTANCE Description STG Manage Bladder With Equipment With Assistance Outcome: Not Progressing Goal: RH OTHER STG BLADDER ELIMINATION GOALS W/ASSIST Description Other STG Bladder Elimination Goals With Assistance Outcome: Not Progressing   Problem: RH SKIN INTEGRITY Goal: RH STG SKIN FREE OF INFECTION/BREAKDOWN Outcome: Not Progressing Goal: RH STG MAINTAIN SKIN INTEGRITY WITH  ASSISTANCE Description STG Maintain Skin Integrity With min Assistance.  Outcome: Not Progressing Goal: RH OTHER STG SKIN INTEGRITY GOALS W/ASSIST Description Other STG Skin Integrity Goals With min  Assistance.  Outcome: Not Progressing   Problem: RH SAFETY Goal: RH STG ADHERE TO SAFETY PRECAUTIONS W/ASSISTANCE/DEVICE Description STG Adhere to Safety Precautions With min Assistance/Device.  Outcome: Not Progressing Goal: RH STG DECREASED RISK OF FALL WITH ASSISTANCE Description STG Decreased Risk of Fall With min Assistance.  Outcome: Not Progressing Goal: RH STG DEMO UNDERSTANDING HOME SAFETY PRECAUTIONS Outcome: Not Progressing Goal: RH OTHER STG SAFETY GOALS W/ASSIST Description Other STG Safety Goals With Assistance. Outcome: Not Progressing   Problem: RH PAIN MANAGEMENT Goal: RH STG PAIN MANAGED AT OR BELOW PT'S PAIN GOAL Outcome: Not Progressing Goal: RH OTHER STG PAIN MANAGEMENT GOALS W/ASSIST Description Other STG Pain Management Goals With min Assistance.  Outcome: Not Progressing   Problem: RH KNOWLEDGE DEFICIT GENERAL Goal: RH STG INCREASE KNOWLEDGE OF SELF CARE AFTER HOSPITALIZATION Outcome: Not Progressing   Problem: Consults Goal: RH BRAIN INJURY PATIENT EDUCATION Description Description: See Patient Education module for eduction specifics Outcome: Not Progressing

## 2018-06-21 NOTE — IPOC Note (Signed)
Overall Plan of Care [facility]) Patient Details Name: Mark Montoya MRN: [id number] DOB: [birth year]  Admitting Diagnosis: <principal problem not specified>ICH  Hospital Problems: Active Problems:   ICH (intracerebral hemorrhage) (HCC)     Functional Problem List: Nursing Behavior, Bladder, Bowel, Safety, Skin Integrity  PT Balance, Behavior, Edema, Endurance, Motor, Pain, Perception, Safety, Sensory, Skin Integrity, Nutrition  OT Balance, Cognition, Endurance, Motor, Perception, Safety, Sensory, Vision  SLP Cognition, Linguistic, Nutrition  TR         Basic ADL's: OT Grooming, Bathing, Toileting, Dressing     Advanced  ADL's: OT       Transfers: PT Bed to Chair, Bed Mobility, Car, Furniture, Civil Service fast streamer, Research scientist (life sciences): PT Ambulation, Psychologist, prison and probation services, Stairs     Additional Impairments: OT Fuctional Use of Upper Extremity  SLP Swallowing, Communication, Social Cognition comprehension, expression Social Interaction, Problem Solving, Memory, Attention, Awareness  TR      Anticipated Outcomes Item Anticipated Outcome  Self Feeding Supervision  Swallowing  Min A with least restrictive diet    Basic self-care  supervision  Toileting  Supervision   Bathroom Transfers Supervision  Bowel/Bladder  Incont B/B, LBM 06/18/18 will assist with bathroom needs.   Transfers  Supervision assist with LRAD   Locomotion  Min assist with LRAD at house hold level   Communication  Min A  Cognition  MIn A   Pain  Denies pain, Assess pain as needed, administered prn pain regimen   Safety/Judgment  Call light, bed/chair alarm, proper footwear, Assist pt with transfers and ambulation    Therapy Plan: PT Intensity: Minimum of 1-2 x/day ,45 to 90 minutes PT Frequency: 5 out of 7 days PT Duration Estimated Length of Stay: 20-21 days  OT Intensity: Minimum of 1-2 x/day, 45 to 90 minutes OT Frequency: 5 out of 7 days OT Duration/Estimated Length of Stay: 21-24  days SLP Intensity: Minumum of 1-2 x/day, 30 to 90 minutes SLP Frequency: 3 to 5 out of 7 days SLP Duration/Estimated Length of Stay: 3 weeks     Team Interventions: Nursing Interventions Patient/Family Education, Bladder Management, Bowel Management, Pain Management, Medication Management, Skin Care/Wound Management  PT interventions Ambulation/gait training, Balance/vestibular training, Cognitive remediation/compensation, Community reintegration, Discharge planning, Disease management/prevention, Functional mobility training, Functional electrical stimulation, DME/adaptive equipment instruction, Neuromuscular re-education, Pain management, Patient/family education, Splinting/orthotics, Skin care/wound management, Psychosocial support, Stair training, Therapeutic Activities, Therapeutic Exercise, Visual/perceptual remediation/compensation, UE/LE Coordination activities, UE/LE Strength taining/ROM, Wheelchair propulsion/positioning  OT Interventions Balance/vestibular training, Discharge planning, Pain management, Self Care/advanced ADL retraining, Therapeutic Activities, UE/LE Coordination activities, Cognitive remediation/compensation, Disease mangement/prevention, Functional mobility training, Patient/family education, Skin care/wound managment, Therapeutic Exercise, Visual/perceptual remediation/compensation, Firefighter, Fish farm manager, Neuromuscular re-education, Psychosocial support, Splinting/orthotics, UE/LE Strength taining/ROM, Wheelchair propulsion/positioning  SLP Interventions Cognitive remediation/compensation, Environmental controls, Internal/external aids, Speech/Language facilitation, Therapeutic Activities, Patient/family education, Functional tasks, Dysphagia/aspiration precaution training, Cueing hierarchy  TR Interventions    SW/CM Interventions Discharge Planning, Psychosocial Support, Patient/Family Education   Barriers to Discharge MD  Medical  stability  Nursing      PT Inaccessible home environment, Decreased caregiver support, Medical stability, Home environment access/layout, Incontinence, Behavior, Medication compliance    OT      SLP      SW       Team Discharge Planning: Destination: PT-Home ,OT- Home , SLP-Home Projected Follow-up: PT-Home health PT, OT-  Home health OT, SLP-24 hour supervision/assistance, Home Health SLP Projected Equipment Needs: PT-Rolling walker with 5" wheels,  Wheelchair cushion (measurements), Wheelchair (measurements), OT- To be determined, 3 in 1 bedside comode, Tub/shower bench, SLP-To be determined Equipment Details: PT- , OT-no family present to confirm current equipment Patient/family involved in discharge planning: PT- Patient,  OT-Patient, SLP-Patient  MD ELOS: 20-24 days Medical Rehab Prognosis:  Excellent Assessment: The patient has been admitted for CIR therapies with the diagnosis of ICH. The team will be addressing functional mobility, strength, stamina, balance, safety, adaptive techniques and equipment, self-care, bowel and bladder mgt, patient and caregiver education, NMR, cognitive-perceptual rx, swallowing, speech, cognition. Goals have been set at supervision for mobility and self-care and min assist for cognition and communication.    Ranelle OysterZachary T. Isadora Delorey, MD, FAAPMR      See Team Conference Notes for weekly updates to the plan of care

## 2018-06-21 NOTE — Progress Notes (Signed)
Physical Therapy Session Note  Patient Details  Name: Mark Montoya MRN: [id number] Date of Birth: October 30, 1941  Today's Date: 06/21/2018 PT Individual Time: 1300-1405 PT Individual Time Calculation (min): 65 min   Short Term Goals: Week 1:  PT Short Term Goal 1 (Week 1): Pt will consistenty perform bed mobility with mod assist consistently  PT Short Term Goal 2 (Week 1): Pt will transfer to [facility] with mod assist consistently  PT Short Term Goal 3 (Week 1): Pt will ambulate >176f with mod assist and LRAD  PT Short Term Goal 4 (Week 1): pt will maintain standing balance with min assist x 2 min   Skilled Therapeutic Interventions/Progress Updates:   Pt in w/c and initially refusing to leave room w/ therapist, but agreeable if therapist would assist him back to bed at end of session. Session focused on endurance/global strengthening and initiation/attention w/ functional tasks. Mod assist w/c mobility to therapy gym using BUEs and manual assist to propel to work on attention to functional task and overall endurance. Worked on sit<>stands x3 reps and standing tolerance in gym w/ mod assist to stand and min guard to maintain static stance for 30-45 sec at a time. Tactile and visual cues for posture. Ambulated 150' w/ min assist to day room, max cues for attention and RW management, and performed NuStep 10 min @ level 4 w/ frequent verbal cues to attend to task. Max multimodal cues for initiation and technique w/ all functional tasks, very sleepy and falling asleep throughout session. He requires almost constant reminders that he is in therapy and working on getting stronger so he can go home. Returned to room and ended session in supine, call bell within reach and all needs met.   Therapy Documentation Precautions:  Precautions Precautions: Fall Restrictions Weight Bearing Restrictions: No Pain:    See Function Navigator for Current Functional Status.   Therapy/Group: Individual Therapy  Ruven Corradi K  Arnette 06/21/2018, 2:07 PM

## 2018-06-21 NOTE — Progress Notes (Signed)
Occupational Therapy Session Note  Patient Details  Name: Mark Montoya MRN: [id number] Date of Birth: 07-04-1941  Today's Date: 06/21/2018 OT Individual Time: 1000-1044 OT Individual Time Calculation (min): 44 min    Short Term Goals: Week 1:  OT Short Term Goal 1 (Week 1): Pt will complete BSC/toilet transfer with MAX A OT Short Term Goal 2 (Week 1): Pt will locate 3/5 needed ADL items on R side of sink wiht min VC OT Short Term Goal 3 (Week 1): Pt will sit to stand wiht MOD A in prep for clothing managment OT Short Term Goal 4 (Week 1): pt will complete 2/4 steps of donning shirt  Skilled Therapeutic Interventions/Progress Updates:    1:1. Pt with significantly improved arousal and participation this date. Pt awake upon entering. Pt requires A for trunk elevation to assume sitting EOB from supine. Pt abl eto thread BL Einto pants with touching A for sitting balance and MOD A to reciver from 1 LOB posteriorly. Pt sit to stand with MOD A and OT advances pants past hips with pt holding onto RW. Pt restless, but unable to report need. Pt transfers onto toilet using grab bar with MOD A and steadying provided while pt advances pants past hips. Pt has large BM with increased time. OT provides posterior hygiene and A to advance pants past hips after toileting. Pt requires VC at sink to locate cool water on R side. Pt given options of shirts to don on R side. Pt requires MOD A to doff shirt, and msupervision to don shirt d/t decrease attention. PT distracted by internal and external stimulation  Therapy Documentation Precautions:  Precautions Precautions: Fall Restrictions Weight Bearing Restrictions: No General:   Vital Signs:   See Function Navigator for Current Functional Status.   Therapy/Group: Individual Therapy  Shon HaleStephanie M Honore Wipperfurth 06/21/2018, 10:24 AM

## 2018-06-21 NOTE — Progress Notes (Signed)
Speech Language Pathology Daily Session Note  Patient Details  Name: Mark Montoya F Warnell MRN: [id number] Date of Birth: [birth year]  Today's Date: 06/21/2018 SLP Individual Time: 1200-1257 SLP Individual Time Calculation (min): 57 min  Short Term Goals: Week 1: SLP Short Term Goal 1 (Week 1): Patient will consume current diet with minimal overt s/s of aspiration with Min A verbal cues for use of swallowing strategies.  SLP Short Term Goal 2 (Week 1): Patient will demonstrate sustained attention to functional tasks for 10 minutes with Mod A verbal cues for redirection.  SLP Short Term Goal 3 (Week 1): Patient will self-monitor verbal errors at the phrase level with Mod A multimodal cues.   Skilled Therapeutic Interventions:Skilled ST services focused on swallow and speech skills. SLP facilitated PO consumption of dys 1 and HTL with lunch tray. Pt required Mod A cues for small bites, demonstrating no overt s/s aspiration. Pt demonstrated functional problem solving accessing items on tray with initial set up requiring min A cues. Pt required Mod A cues for sustained attention in 5 minute intervals. SLP facilitated verbal error awareness during Informal conversation requiring max A cues and total A cues for correction of errors. Pt demonstarted intermittent moments of intelligibility at phrase level, however most was unintelligible with 1x insistence of awareness. SLP facilitated naming of functional items, pt required total A cues. Pt demonstrated reduced safety awareness sidling down in WC twice during session requiring mod A to total A to be repositioned in chair. Pt was left with call bell within reach and bed alaram set.ST reccomends to continue skilled ST services.     Function:  Eating Eating   Modified Consistency Diet: Yes Eating Assist Level: Set up assist for;Supervision or verbal cues;Helper checks for pocketed food;More than reasonable amount of time   Eating Set Up Assist For: Opening  containers       Cognition Comprehension Comprehension assist level: Understands basic 50 - 74% of the time/ requires cueing 25 - 49% of the time;Understands basic 25 - 49% of the time/ requires cueing 50 - 75% of the time  Expression   Expression assist level: Expresses basic 25 - 49% of the time/requires cueing 50 - 75% of the time. Uses single words/gestures.  Social Interaction Social Interaction assist level: Interacts appropriately 50 - 74% of the time - May be physically or verbally inappropriate.  Problem Solving Problem solving assist level: Solves basic 25 - 49% of the time - needs direction more than half the time to initiate, plan or complete simple activities  Memory Memory assist level: Recognizes or recalls 25 - 49% of the time/requires cueing 50 - 75% of the time    Pain Pain Assessment Pain Score: 0-No pain  Therapy/Group: Individual Therapy  Kamora Vossler  [facility] 06/21/2018, 3:36 PM

## 2018-06-22 ENCOUNTER — Inpatient Hospital Stay (HOSPITAL_COMMUNITY): Payer: Medicare Other

## 2018-06-22 LAB — GLUCOSE, CAPILLARY
GLUCOSE-CAPILLARY: 167 mg/dL — AB (ref 70–99)
Glucose-Capillary: 205 mg/dL — ABNORMAL HIGH (ref 70–99)
Glucose-Capillary: 216 mg/dL — ABNORMAL HIGH (ref 70–99)
Glucose-Capillary: 131 mg/dL — ABNORMAL HIGH (ref 70–99)

## 2018-06-22 NOTE — Progress Notes (Signed)
Physical Therapy Session Note  Patient Details  Name: Mark Montoya MRN: [id number] Date of Birth: 10-26-1941  Today's Date: 06/22/2018 PT Individual Time: 1520-1600 PT Individual Time Calculation (min): 40 min   Short Term Goals: Week 1:  PT Short Term Goal 1 (Week 1): Pt will consistenty perform bed mobility with mod assist consistently  PT Short Term Goal 2 (Week 1): [facility]  PT Short Term Goal 3 (Week 1): Pt will ambulate >13325ft with mod assist and LRAD  PT Short Term Goal 4 (Week 1): pt will maintain standing balance with min assist x 2 min   Skilled Therapeutic Interventions/Progress Updates:    Pt supine in bed asleep upon PT arrival, agreeable to therapy tx and denies pain. Pt transferred to EOB with mod assist and verbal cues for techniques. Pt performed sit<>stand with mod assist and stand pivot with mod assist. Pt transported to the dayroom. Pt performed x 5 sit<>stands this session working on technique and LE strength, mod assist. Pt performed squat pivot transfer to nustep with mod assist, used x 6 minutes on workload 5 for global strengthening and activity tolerance. Pt transferred back to w/c squat pivot mod assist. Pt performed standing card matching activity, max cues for attention to task. Pt transported back to room and left seated with needs in reach, QRB in place.   Therapy Documentation Precautions:  Precautions Precautions: Fall Restrictions Weight Bearing Restrictions: No   See Function Navigator for Current Functional Status.   Therapy/Group: Individual Therapy  Cresenciano GenreEmily van Schagen, PT, DPT 06/22/2018, 1:04 PM

## 2018-06-22 NOTE — Progress Notes (Addendum)
[place] PHYSICAL MEDICINE & REHABILITATION     PROGRESS NOTE    Subjective/Complaints: No new problems overnight.  Slept well.  Still anxious to leave and get back to his business at home  ROS: Limited due to cognitive/behavioral    Objective:  Dg Chest 2 View  Result Date: 06/20/2018 CLINICAL DATA:  77 year old male with a history of fever EXAM: CHEST - 2 VIEW COMPARISON:  06/15/2018 FINDINGS: Cardiomediastinal silhouette unchanged in size and contour. Surgical changes of median sternotomy and CABG. Calcifications of the aortic arch. Low lung volumes. No evidence of central vascular congestion or interlobular septal thickening. Coarsened interstitial markings similar to prior. No pleural effusion on the lateral view.  No pneumothorax. Asymmetric elevation of left hemidiaphragm, similar to prior. No confluent airspace disease. IMPRESSION: Low volumes and chronic lung changes without evidence of superimposed acute cardiopulmonary disease. Surgical changes of median sternotomy and CABG. Electronically Signed   By: Gilmer Mor D.O.   On: 06/20/2018 13:11   Recent Labs    06/20/18 0556  WBC 9.9  HGB 10.2*  HCT 32.1*  PLT 148*   Recent Labs    06/20/18 0556  NA 141  K 3.5  CL 106  GLUCOSE 181*  BUN 33*  CREATININE 1.50*  CALCIUM 8.6*   CBG (last 3)  Recent Labs    06/21/18 1636 06/21/18 2101 06/22/18 0621  GLUCAP 158* 156* 167*    Wt Readings from Last 3 Encounters:  06/18/18 79.6 kg (175 lb 7.8 oz)  06/13/18 78.5 kg (173 lb 1 oz)  05/19/17 79.4 kg (175 lb)     Intake/Output Summary (Last 24 hours) at 06/22/2018 0735 Last data filed at 06/21/2018 2200 Gross per 24 hour  Intake 120 ml  Output 250 ml  Net -130 ml    Vital Signs: Blood pressure (!) 174/81, pulse 74, temperature 98.9 F (37.2 C), temperature source Oral, resp. rate 17, height 5\' 7"  (1.702 m), weight 79.6 kg (175 lb 7.8 oz), SpO2 95 %. Physical Exam:  Constitutional: No distress . Vital signs  reviewed. HEENT: EOMI, oral membranes moist Neck: supple Cardiovascular: RRR without murmur. No JVD    Respiratory: CTA Bilaterally without wheezes or rales. Normal effort    GI: BS +, non-tender, non-distended   Musculoskeletal:  No edema or tenderness in extremities  Neurological: He is alert.  Remains a phasic with garbled speech.  Follows commands inconsistently  right inattention.  RLE hyperrefexia  Skin: Scalp wound with scab, dried blood on pillow.  Psychiatric:  Flat, cooperative     Assessment/Plan: 1. Functional deficits secondary to TBI which require 3+ hours per day of interdisciplinary therapy in a comprehensive inpatient rehab setting. Physiatrist is providing close team supervision and 24 hour management of active medical problems listed below. Physiatrist and rehab team continue to assess barriers to discharge/monitor patient progress toward functional and medical goals.  Function:  Bathing Bathing position      Bathing parts Body parts bathed by patient: Right upper leg, Left upper leg, Front perineal area Body parts bathed by helper: Right lower leg, Left lower leg, Buttocks(LB only)  Bathing assist        Upper Body Dressing/Undressing Upper body dressing   What is the patient wearing?: Pull over shirt/dress     Pull over shirt/dress - Perfomed by patient: Thread/unthread left sleeve, Put head through opening, Pull shirt over trunk, Thread/unthread right sleeve Pull over shirt/dress - Perfomed by helper: Thread/unthread right sleeve  Upper body assist        Lower Body Dressing/Undressing Lower body dressing   What is the patient wearing?: Pants, Ted Hose, Non-skid slipper socks     Pants- Performed by patient: Thread/unthread right pants leg, Thread/unthread left pants leg Pants- Performed by helper: Pull pants up/down   Non-skid slipper socks- Performed by helper: Don/doff right sock, Don/doff left sock               TED Hose -  Performed by helper: Don/doff right TED hose, Don/doff left TED hose  Lower body assist        Toileting Toileting Toileting activity did not occur: No continent bowel/bladder event Toileting steps completed by patient: Adjust clothing prior to toileting Toileting steps completed by helper: Performs perineal hygiene, Adjust clothing after toileting    Toileting assist Assist level: Two helpers   Transfers Chair/bed transfer Chair/bed transfer activity did not occur: Safety/medical concerns Chair/bed transfer method: Stand pivot Chair/bed transfer assist level: Moderate assist (Pt 50 - 74%/lift or lower) Chair/bed transfer assistive device: Armrests     Locomotion Ambulation     Max distance: 150' Assist level: Touching or steadying assistance (Pt > 75%)   Wheelchair   Type: Manual Max wheelchair distance: 100' Assist Level: Moderate assistance (Pt 50 - 74%)  Cognition Comprehension Comprehension assist level: Understands basic 50 - 74% of the time/ requires cueing 25 - 49% of the time  Expression Expression assist level: Expresses basic 25 - 49% of the time/requires cueing 50 - 75% of the time. Uses single words/gestures.  Social Interaction Social Interaction assist level: Interacts appropriately 50 - 74% of the time - May be physically or verbally inappropriate.  Problem Solving Problem solving assist level: Solves basic 25 - 49% of the time - needs direction more than half the time to initiate, plan or complete simple activities  Memory Memory assist level: Recognizes or recalls 25 - 49% of the time/requires cueing 50 - 75% of the time   Medical Problem List and Plan: 1.  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  DVT Prophylaxis/Anticoagulation: Subcutaneous heparin initiated 06/17/2018.  Monitor for any bleeding episodes 3. Pain Management: Tylenol as needed 4. Mood: Aricept 10 mg nightly, Xanax 0.125 mg  nightly, Zoloft 100 mg daily, Seroquel 12.5 mg twice daily 5. Neuropsych: This patient is not capable of making decisions on his own behalf. 6. Skin/Wound Care: Routine skin checks  -local care to scalp as needed  -remove staples soon once he stops picking at area and wound has more chance to heal 7. Fluids/Electrolytes/Nutrition: encourage PO     \ -continue to push fluids     8.  Dysphasia.  Dysphasia #1 honey thick liquid.  Follow-up speech therapy 9.  Hypertension.  Clonidine 0.3 mg TID, hydralazine 50 mg every 8 hours, Norvasc 10 mg daily.   -Borderline control 8/4 10.  Diabetes mellitus.  Hemoglobin A1c 7.0.  Glucotrol 5 mg daily, Glucophage 1000 mg twice daily.    -Continued improvements improvement over the weekend  -hold on changes at present  -cover with SSI 11.  CKD stage III.  Creatinine baseline 2.07. (33/1.5 8/2) 12.  BPH.  Flomax 0.8 mg daily.    13.  CAD with CABG 2003.  Aspirin currently on hold due to left temporal lobe hemorrhage.  No chest pain or shortness of breath 13. ABLA: up to 10.2 today   -stool OB negative   -  continue to follow 14. Low grade temp: Patient has been afebrile over the last 24+ hours  -Urinalysis negative, urine culture negative as well  -Chest x-ray reviewed and without acute findings  -Continue incentive spirometry and observe  LOS (Days) 4 A FACE TO FACE EVALUATION WAS PERFORMED  Ranelle Oyster, MD 06/22/2018 7:35 AM

## 2018-06-22 NOTE — Progress Notes (Signed)
Occupational Therapy Session Note  Patient Details  Name: Mark Montoya MRN: [id number] Date of Birth: [birth year]  Today's Date: 06/22/2018 OT Individual Time: 1045-1200 OT Individual Time Calculation (min): 75 min    Short Term Goals: Week 1:  OT Short Term Goal 1 (Week 1): Pt will complete BSC/toilet transfer with MAX A OT Short Term Goal 2 (Week 1): Pt will locate 3/5 needed ADL items on R side of sink wiht min VC OT Short Term Goal 3 (Week 1): Pt will sit to stand wiht MOD A in prep for clothing managment OT Short Term Goal 4 (Week 1): pt will complete 2/4 steps of donning shirt  Skilled Therapeutic Interventions/Progress Updates:    1:1. Pt agreeable to shower this session. No pain reported. Pt alert this session, however unable to orient pt and pt periodically hallucinating that there is a bulldog and another person in room (question aphasia, dementia v hallucinations). Pt requires copious time to process/iniate cued actions throughout session d/t decreased attention/internal distractions and disorientation/confused why OT was asking him to do things. Pt completes supine>sitting EOB with supervision and MOD A for stand pivot transfers/sit to stands to various surfaces (w/c/TTB/EOB) with VC for hand placement. Pt bathes sit to stand in shower with touching A for sitting balance while reaching to floor to wash feet and HOH A to initate bathing 40% of body parts as pt unable to recall what parts already washed. Pt stands with MOD A and B hands on grab bar as OT washes buttocks. Pt completes dressing at sink with A for orientation of clothing. Pt able to don shirt with supervision and pants with A for noticing pants originally donned backwards (pt able to fix mistake). OT dons ted hose and despite cueing/explanation pt does not don grip socks. Pt able to thread BLE into pants and advance pants past hips with MOD A. OT dons d/t running out of time. Pt left in w/c with call light in reach, belt alarm  on and all needs met.  Therapy Documentation Precautions:  Precautions Precautions: Fall Restrictions Weight Bearing Restrictions: No General:   Vital Signs:   See Function Navigator for Current Functional Status.   Therapy/Group: Individual Therapy  Tonny Branch 06/22/2018, 12:07 PM

## 2018-06-23 ENCOUNTER — Ambulatory Visit (HOSPITAL_COMMUNITY): Payer: Medicare Other | Attending: Physical Medicine & Rehabilitation

## 2018-06-23 ENCOUNTER — Inpatient Hospital Stay (HOSPITAL_COMMUNITY): Payer: Medicare Other | Admitting: Occupational Therapy

## 2018-06-23 ENCOUNTER — Inpatient Hospital Stay (HOSPITAL_COMMUNITY): Payer: Medicare Other | Admitting: Speech Pathology

## 2018-06-23 ENCOUNTER — Inpatient Hospital Stay (HOSPITAL_COMMUNITY): Payer: Medicare Other

## 2018-06-23 DIAGNOSIS — M7989 Other specified soft tissue disorders: Secondary | ICD-10-CM | POA: Insufficient documentation

## 2018-06-23 DIAGNOSIS — R509 Fever, unspecified: Secondary | ICD-10-CM | POA: Insufficient documentation

## 2018-06-23 LAB — BASIC METABOLIC PANEL
Anion gap: 11 (ref 5–15)
BUN: 37 mg/dL — AB (ref 8–23)
CHLORIDE: 108 mmol/L (ref 98–111)
CO2: 25 mmol/L (ref 22–32)
CREATININE: 1.65 mg/dL — AB (ref 0.61–1.24)
Calcium: 9 mg/dL (ref 8.9–10.3)
GFR, EST AFRICAN AMERICAN: 44 mL/min — AB (ref >60)
GFR, EST NON AFRICAN AMERICAN: 38 mL/min — AB (ref >60)
Glucose, Bld: 179 mg/dL — ABNORMAL HIGH (ref 70–99)
Potassium: 3.5 mmol/L (ref 3.5–5.1)
SODIUM: 144 mmol/L (ref 135–145)

## 2018-06-23 LAB — CBC WITH DIFFERENTIAL/PLATELET
Abs Immature Granulocytes: 0.1 10*3/uL (ref 0.0–0.1)
Basophils Absolute: 0 10*3/uL (ref 0.0–0.1)
Basophils Relative: 0 %
EOS ABS: 0 10*3/uL (ref 0.0–0.7)
Eosinophils Relative: 0 %
HEMATOCRIT: 32.2 % — AB (ref 39.0–52.0)
HEMOGLOBIN: 10.3 g/dL — AB (ref 13.0–17.0)
IMMATURE GRANULOCYTES: 1 %
LYMPHS ABS: 0.7 10*3/uL (ref 0.7–4.0)
Lymphocytes Relative: 4 %
MCH: 29.9 pg (ref 26.0–34.0)
MCHC: 32 g/dL (ref 30.0–36.0)
MCV: 93.6 fL (ref 78.0–100.0)
Monocytes Absolute: 0.8 10*3/uL (ref 0.1–1.0)
Monocytes Relative: 4 %
NEUTROS PCT: 91 %
Neutro Abs: 19 10*3/uL — ABNORMAL HIGH (ref 1.7–7.7)
Platelets: 170 10*3/uL (ref 150–400)
RBC: 3.44 MIL/uL — ABNORMAL LOW (ref 4.22–5.81)
RDW: 13.2 % (ref 11.5–15.5)
WBC: 20.6 10*3/uL — ABNORMAL HIGH (ref 4.0–10.5)

## 2018-06-23 LAB — URINALYSIS, ROUTINE W REFLEX MICROSCOPIC
BACTERIA UA: NONE SEEN
BILIRUBIN URINE: NEGATIVE
Glucose, UA: NEGATIVE mg/dL
KETONES UR: NEGATIVE mg/dL
LEUKOCYTES UA: NEGATIVE
NITRITE: NEGATIVE
PROTEIN: NEGATIVE mg/dL
SPECIFIC GRAVITY, URINE: 1.019 (ref 1.005–1.030)
pH: 5 (ref 5.0–8.0)

## 2018-06-23 LAB — GLUCOSE, CAPILLARY
GLUCOSE-CAPILLARY: 178 mg/dL — AB (ref 70–99)
GLUCOSE-CAPILLARY: 157 mg/dL — AB (ref 70–99)
GLUCOSE-CAPILLARY: 83 mg/dL (ref 70–99)
Glucose-Capillary: 164 mg/dL — ABNORMAL HIGH (ref 70–99)
Glucose-Capillary: 192 mg/dL — ABNORMAL HIGH (ref 70–99)

## 2018-06-23 MED ORDER — GLIPIZIDE 5 MG PO TABS
5.0000 mg | ORAL_TABLET | Freq: Two times a day (BID) | ORAL | Status: DC
  Administered 2018-06-23 – 2018-07-10 (×33): 5 mg via ORAL
  Filled 2018-06-23 (×33): qty 1

## 2018-06-23 MED ORDER — ALPRAZOLAM 0.25 MG PO TABS
0.1250 mg | ORAL_TABLET | Freq: Once | ORAL | Status: AC
Start: 2018-06-23 — End: 2018-06-23
  Administered 2018-06-23: 0.125 mg via ORAL

## 2018-06-23 MED ORDER — PIPERACILLIN-TAZOBACTAM 3.375 G IVPB
3.3750 g | Freq: Three times a day (TID) | INTRAVENOUS | Status: DC
  Administered 2018-06-23 – 2018-06-24 (×4): 3.375 g via INTRAVENOUS
  Filled 2018-06-23 (×6): qty 50

## 2018-06-23 NOTE — Progress Notes (Signed)
Occupational Therapy Session Note  Patient Details  Name: Mark Montoya MRN: [id number] Date of Birth: Aug 10, 1941  Today's Date: 06/23/2018 OT Individual Time: 4098-11910803-0915 OT Individual Time Calculation (min): 72 min   Short Term Goals: Week 1:  OT Short Term Goal 1 (Week 1): Pt will complete BSC/toilet transfer with MAX A OT Short Term Goal 2 (Week 1): Pt will locate 3/5 needed ADL items on R side of sink wiht min VC OT Short Term Goal 3 (Week 1): Pt will sit to stand wiht MOD A in prep for clothing managment OT Short Term Goal 4 (Week 1): pt will complete 2/4 steps of donning shirt  Skilled Therapeutic Interventions/Progress Updates:    Pt greeted semi-reclined in bed and agreeable to OT treatment session. OT assisted with donning Ted hose in bed using friction reducing device, then pt needed multimodal cues and demonstration to follow commands to then pull TED hose up calf. OT also initiated donning socks with hand over hand A to initiate pulling socks up as well. Pt followed commands to come to sitting EOB with instructional cues and mod A to elevate trunk. Mod A stand-pivot to wc. Asked pt if he needed to use the bathroom and he said yes. Stand-pivot to toilet with mod A and use of grab bars. Pt impulsive to sit prior to clothing management requiring max A to stay standing long enough for OT to remove brief. Pt unable to have BM or empty bladder on commode. Mod A stand-pivot back to wc. Bathing/dressing completed at the sink with multimodal cues to initiate washing body parts. Worked on sit<>stand and standing balance within BADL tasks. Pts first stand was Min A, then progressed to mod A with fatigue. Multimodal cues for hand placement when standing. Pt with lateral lean to the R in standing and tried to utilize mirror feedback to bring pt back to midline, but unsuccessful. Overall Mod/max A at times for dynamic standing balance. Pt set-up for breakfast and left in care of nurse tech to eat.    Therapy Documentation Precautions:  Precautions Precautions: Fall Restrictions Weight Bearing Restrictions: No Pain: Pain Assessment Pain Scale: 0-10 Pain Score: 0-No pain  See Function Navigator for Current Functional Status.  Therapy/Group: Individual Therapy  Mal Amabilelisabeth S Nekeisha Aure 06/23/2018, 9:14 AM

## 2018-06-23 NOTE — Progress Notes (Signed)
Pt returned to room via WC. Pt shaking and anxious. Contacted PA Dan. Ordered Xanax 1x dose to give now. Will admin and recheck pt. Pt in bed with side rails up x 4 call bell within reach. Fall mats in place. Bed alarm on.  K Tapanga [place]taway, LPN

## 2018-06-23 NOTE — Progress Notes (Signed)
Speech Language Pathology Daily Session Note  Patient Details  Name: Mark Montoya F Scalese MRN: [id number] Date of Birth: 12/20/1940  Today's Date: 06/23/2018 SLP Individual Time: [id number] SLP Individual Time Calculation (min): 55 min  Short Term Goals: Week 1: SLP Short Term Goal 1 (Week 1): Patient will consume current diet with minimal overt s/s of aspiration with Min A verbal cues for use of swallowing strategies.  SLP Short Term Goal 2 (Week 1): Patient will demonstrate sustained attention to functional tasks for 10 minutes with Mod A verbal cues for redirection.  SLP Short Term Goal 3 (Week 1): Patient will self-monitor verbal errors at the phrase level with Mod A multimodal cues.   Skilled Therapeutic Interventions: Skilled treatment session focused on cognitive-linguistic goals. Upon arrival, patient was asleep in the wheelchair. Patient easily awakened and reported fatigue due to lack of sleep. Patient also required total A for orientation to place and was requesting to go home and call his family so that they knew where he was. SLP provided support. Patient was ~50% intelligible and demonstrated increased verbal errors later in the session as fatigue and lethargy progressed. Patient sorted coins from a field of 4 with extra time and Min A verbal cues but required total A to count coins due to confusion and thinking clinician wanted his personal money. Patient required constant redirection to task due to verbosity and confusion. Patient transferred back to bed at end of session. Patient left supine in bed with alarm on and all needs within reach. Continue with current plan of care.      Function:   Cognition Comprehension Comprehension assist level: Understands basic 50 - 74% of the time/ requires cueing 25 - 49% of the time  Expression   Expression assist level: Expresses basic 25 - 49% of the time/requires cueing 50 - 75% of the time. Uses single words/gestures.  Social Interaction Social  Interaction assist level: Interacts appropriately 50 - 74% of the time - May be physically or verbally inappropriate.  Problem Solving Problem solving assist level: Solves basic 50 - 74% of the time/requires cueing 25 - 49% of the time  Memory Memory assist level: Recognizes or recalls 25 - 49% of the time/requires cueing 50 - 75% of the time    Pain No/Denies Pain   Therapy/Group: Individual Therapy  Esteen Delpriore 06/23/2018, 3:23 PM

## 2018-06-23 NOTE — Progress Notes (Signed)
Physical Therapy Session Note  Patient Details  Name: Elgie Collardarl F Niazi MRN: [id number] Date of Birth: [birth year]  Today's Date: 06/23/2018 PT Individual Time: 1300-1413 PT Individual Time Calculation (min): 73 min   Short Term Goals: Week 1:  PT Short Term Goal 1 (Week 1): Pt will consistenty perform bed mobility with mod assist consistently  PT Short Term Goal 2 (Week 1): [facility]  PT Short Term Goal 3 (Week 1): Pt will ambulate >15225ft with mod assist and LRAD  PT Short Term Goal 4 (Week 1): pt will maintain standing balance with min assist x 2 min   Skilled Therapeutic Interventions/Progress Updates:    Pt supine in bed upon PT arrival, agreeable to therapy tx and denies pain. Pt transferred from supine>sitting EOB with mod assist and transferred from bed>w/c with mod assist, stand pivot. Pt c/o being cold so therapist transported pt outside this session. Pt performed LE therex while seated in w/c including 2 x 10 LAQ, 2 x 10 hip flexion, x 20 ankle pumps. Pt performed x 4 sit<>stands from w/c without AD, min assist, able to maintain static standing balance with CGA for 30-60 sec bouts. Pt transported back to gym. Pt ambulated 2 x 150 ft this session without AD, min-mod assist secondary to occasional LOB, occasional scissoring also noted. Pt worked on dynamic seated balance and UE coordination to participate in ball toss activity, x 2 trials. Pt ambulated x 120 ft with RW and min assist working on endurance/activity tolerance, decreased gait speed overall. Pt transported back to room in w/c. Pt performed stand pivot with min assist back to bed, transferred to supine with min assist and left in care of RN with bed alarm set.   Therapy Documentation Precautions:  Precautions Precautions: Fall Restrictions Weight Bearing Restrictions: No   See Function Navigator for Current Functional Status.   Therapy/Group: Individual Therapy  Cresenciano GenreEmily van Schagen,  PT, DPT 06/23/2018, 7:52 AM

## 2018-06-23 NOTE — Progress Notes (Signed)
[place] PHYSICAL MEDICINE & REHABILITATION     PROGRESS NOTE    Subjective/Complaints: Pt awake in bed. Denies pain. Slept last night. Didn't state he wanted to go home today!  ROS: Patient denies fever, rash, sore throat, blurred vision, nausea, vomiting, diarrhea, cough, shortness of breath or chest pain, joint or back pain, headache, or mood change.   Objective:  No results found. No results for input(s): WBC, HGB, HCT, PLT in the last 72 hours. Recent Labs    06/23/18 0533  NA 144  K 3.5  CL 108  GLUCOSE 179*  BUN 37*  CREATININE 1.65*  CALCIUM 9.0   CBG (last 3)  Recent Labs    06/22/18 1631 06/22/18 2124 06/23/18 0646  GLUCAP 216* 131* 178*    Wt Readings from Last 3 Encounters:  06/18/18 79.6 kg (175 lb 7.8 oz)  06/13/18 78.5 kg (173 lb 1 oz)  05/19/17 79.4 kg (175 lb)     Intake/Output Summary (Last 24 hours) at 06/23/2018 0848 Last data filed at 06/22/2018 1700 Gross per 24 hour  Intake 170 ml  Output 350 ml  Net -180 ml    Vital Signs: Blood pressure (!) 179/76, pulse 69, temperature 99 F (37.2 C), temperature source Oral, resp. rate 18, height 5\' 7"  (1.702 m), weight 79.6 kg (175 lb 7.8 oz), SpO2 98 %. Physical Exam:  Constitutional: No distress . Vital signs reviewed. HEENT: EOMI, oral membranes moist, scalp wound without drainage today Neck: supple Cardiovascular: RRR without murmur. No JVD    Respiratory: CTA Bilaterally without wheezes or rales. Normal effort    GI: BS +, non-tender, non-distended   Musculoskeletal:  No edema or tenderness in extremities  Neurological: He is alert.  Expressive aphasia.  Follows commands inconsistently  right inattention still present  RLE hyperrefexia  Skin: Scalp wound with scab.  Psychiatric:  Flat, cooperative     Assessment/Plan: 1. Functional deficits secondary to TBI which require 3+ hours per day of interdisciplinary therapy in a comprehensive inpatient rehab setting. Physiatrist is  providing close team supervision and 24 hour management of active medical problems listed below. Physiatrist and rehab team continue to assess barriers to discharge/monitor patient progress toward functional and medical goals.  Function:  Bathing Bathing position      Bathing parts Body parts bathed by patient: Right upper leg, Left upper leg, Front perineal area Body parts bathed by helper: Right lower leg, Left lower leg, Buttocks(LB only)  Bathing assist        Upper Body Dressing/Undressing Upper body dressing   What is the patient wearing?: Pull over shirt/dress     Pull over shirt/dress - Perfomed by patient: Thread/unthread left sleeve, Put head through opening, Pull shirt over trunk, Thread/unthread right sleeve Pull over shirt/dress - Perfomed by helper: Thread/unthread right sleeve        Upper body assist        Lower Body Dressing/Undressing Lower body dressing   What is the patient wearing?: Pants, Ted Hose, Non-skid slipper socks     Pants- Performed by patient: Thread/unthread right pants leg, Thread/unthread left pants leg Pants- Performed by helper: Pull pants up/down   Non-skid slipper socks- Performed by helper: Don/doff right sock, Don/doff left sock               TED Hose - Performed by helper: Don/doff right TED hose, Don/doff left TED hose  Lower body assist        Toileting Toileting Toileting activity did  not occur: No continent bowel/bladder event Toileting steps completed by patient: Adjust clothing prior to toileting Toileting steps completed by helper: Adjust clothing prior to toileting, Performs perineal hygiene, Adjust clothing after toileting    Toileting assist Assist level: Two helpers   Transfers Chair/bed transfer Chair/bed transfer activity did not occur: Safety/medical concerns Chair/bed transfer method: Stand pivot, Squat pivot Chair/bed transfer assist level: Moderate assist (Pt 50 - 74%/lift or lower) Chair/bed transfer  assistive device: Armrests     Locomotion Ambulation     Max distance: 150' Assist level: Touching or steadying assistance (Pt > 75%)   Wheelchair   Type: Manual Max wheelchair distance: 100' Assist Level: Moderate assistance (Pt 50 - 74%)  Cognition Comprehension Comprehension assist level: Understands basic 50 - 74% of the time/ requires cueing 25 - 49% of the time  Expression Expression assist level: Expresses basic 25 - 49% of the time/requires cueing 50 - 75% of the time. Uses single words/gestures.  Social Interaction Social Interaction assist level: Interacts appropriately 50 - 74% of the time - May be physically or verbally inappropriate.  Problem Solving Problem solving assist level: Solves basic 50 - 74% of the time/requires cueing 25 - 49% of the time  Memory Memory assist level: Recognizes or recalls 25 - 49% of the time/requires cueing 50 - 75% of the time   Medical Problem List and Plan: 1.  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  DVT Prophylaxis/Anticoagulation: Subcutaneous heparin initiated 06/17/2018.  Monitor for any bleeding episodes 3. Pain Management: Tylenol as needed 4. Mood: Aricept 10 mg nightly, Xanax 0.125 mg nightly, Zoloft 100 mg daily, Seroquel 12.5 mg twice daily 5. Neuropsych: This patient is not capable of making decisions on his own behalf. 6. Skin/Wound Care: Routine skin checks  -local care to scalp as needed  -remove staples soon   7. Fluids/Electrolytes/Nutrition: encourage PO     \ -BUN sl up today   -I/O's sl negative yesterday---continue to monitor 8.  Dysphasia.  Dysphasia #1 honey thick liquid.  Follow-up speech therapy 9.  Hypertension.  Clonidine 0.3 mg TID, hydralazine 50 mg every 8 hours, Norvasc 10 mg daily.   -Borderline control 8/4 10.  Diabetes mellitus.  Hemoglobin A1c 7.0.  Glucotrol 5 mg daily, Glucophage 1000 mg twice daily.    -Continued improvements  improvement over the weekend  -hold on changes at present  -cover with SSI 11.  CKD stage III.  Creatinine baseline 2.07. (37/1.65 8/5)  -continue to monitor serially---recheck Wednesday  -can DC NSL 12.  BPH.  Flomax 0.8 mg daily.    13.  CAD with CABG 2003.  Aspirin currently on hold due to left temporal lobe hemorrhage.  No chest pain or shortness of breath 13. ABLA: up to 10.2 8/2   -stool OB negative   -continue to follow 14. Low grade temp: 99 again this am  -Urinalysis negative, urine culture negative as well  -Chest x-ray without acute findings. No clinical signs of infection  -Continue incentive spirometry and observe  -check dopplers today  LOS (Days) 5 A FACE TO FACE EVALUATION WAS PERFORMED  Ranelle OysterZachary T Tamicka Shimon, MD 06/23/2018 8:48 AM

## 2018-06-23 NOTE — Progress Notes (Signed)
Pt going down to Vascular for bilat doppler. Transported via WC. No c/o pain. Will f/up when return. Salomon FickK Coren Crownover, LPN

## 2018-06-23 NOTE — Progress Notes (Signed)
Preliminary notes--Bilateral lower extremities venous duplex exam completed. Negative for DVT.  Hongying Brandn Mcgath (RDMS RVT) 06/23/18' 3:37 PM

## 2018-06-23 NOTE — Progress Notes (Signed)
Pharmacy Antibiotic Note  Elgie Collardarl F Bulls is a [age] y.o. male admitted on 06/18/2018 for rehab s/p left ventricular hemorrhage. Pharmacy has been consulted for Zosyn dosing for fever. Tm 102.5, WBC are normal. CKD 3 noted, SCr 1.65, est CrCl ~ 37 ml/min. Previous cultures are negative.  Plan: Zosyn 3.375g IV q8h (4 hour infusion).  Monitor renal function, clinical progress, de-escalation as able  Height: 5\' 7"  (170.2 cm) Weight: 175 lb 7.8 oz (79.6 kg) IBW/kg (Calculated) : 66.1  Temp (24hrs), Avg:99.8 F (37.7 C), Min:97.8 F (36.6 C), Max:102.5 F (39.2 C)  Recent Labs  Lab 06/17/18 0341 06/18/18 0429 06/19/18 0324 06/20/18 0556 06/23/18 0533  WBC 11.2* 9.6 9.5 9.9  --   CREATININE 1.59* 1.65* 1.63* 1.50* 1.65*    Estimated Creatinine Clearance: 37.3 mL/min (A) (by C-G formula based on SCr of 1.65 mg/dL (H)).    Allergies  Allergen Reactions  . Lipitor [Atorvastatin Calcium]   . Other     provastatin  . Simvastatin      Thank you for allowing pharmacy to be a part of this patient's care.  Loura BackJennifer , PharmD, BCPS Clinical Pharmacist Clinical phone for 06/23/2018 until 10p is [phone] Please check AMION for all Pharmacist numbers by unit 06/23/2018 9:19 PM

## 2018-06-24 ENCOUNTER — Inpatient Hospital Stay (HOSPITAL_COMMUNITY): Payer: Medicare Other | Admitting: Speech Pathology

## 2018-06-24 ENCOUNTER — Inpatient Hospital Stay (HOSPITAL_COMMUNITY): Payer: Medicare Other | Admitting: Physical Therapy

## 2018-06-24 ENCOUNTER — Inpatient Hospital Stay (HOSPITAL_COMMUNITY): Payer: Medicare Other | Admitting: Occupational Therapy

## 2018-06-24 ENCOUNTER — Inpatient Hospital Stay (HOSPITAL_COMMUNITY): Payer: Medicare Other

## 2018-06-24 LAB — GLUCOSE, CAPILLARY
GLUCOSE-CAPILLARY: 75 mg/dL (ref 70–99)
GLUCOSE-CAPILLARY: 51 mg/dL — AB (ref 70–99)
GLUCOSE-CAPILLARY: 84 mg/dL (ref 70–99)
GLUCOSE-CAPILLARY: 68 mg/dL — AB (ref 70–99)
GLUCOSE-CAPILLARY: 104 mg/dL — AB (ref 70–99)
Glucose-Capillary: 51 mg/dL — ABNORMAL LOW (ref 70–99)
Glucose-Capillary: 77 mg/dL (ref 70–99)

## 2018-06-24 LAB — CBC WITH DIFFERENTIAL/PLATELET
BAND NEUTROPHILS: 0 %
BASOS ABS: 0 10*3/uL (ref 0.0–0.1)
Basophils Relative: 0 %
Blasts: 0 %
EOS ABS: 0.2 10*3/uL (ref 0.0–0.7)
Eosinophils Relative: 1 %
HCT: 30.8 % — ABNORMAL LOW (ref 39.0–52.0)
Hemoglobin: 9.7 g/dL — ABNORMAL LOW (ref 13.0–17.0)
LYMPHS PCT: 5 %
Lymphs Abs: 1.2 10*3/uL (ref 0.7–4.0)
MCH: 29.6 pg (ref 26.0–34.0)
MCHC: 31.5 g/dL (ref 30.0–36.0)
MCV: 93.9 fL (ref 78.0–100.0)
METAMYELOCYTES PCT: 0 %
MONOS PCT: 5 %
Monocytes Absolute: 1.2 10*3/uL — ABNORMAL HIGH (ref 0.1–1.0)
Myelocytes: 0 %
NEUTROS ABS: 20.9 10*3/uL — AB (ref 1.7–7.7)
Neutrophils Relative %: 89 %
PLATELETS: 167 10*3/uL (ref 150–400)
PROMYELOCYTES RELATIVE: 0 %
RBC: 3.28 MIL/uL — ABNORMAL LOW (ref 4.22–5.81)
RDW: 13.6 % (ref 11.5–15.5)
WBC: 23.5 10*3/uL — ABNORMAL HIGH (ref 4.0–10.5)
nRBC: 0 /100 WBC

## 2018-06-24 MED ORDER — GLUCOSE 40 % PO GEL
ORAL | Status: AC
  Administered 2018-06-24: 37.5 g
  Filled 2018-06-24: qty 1

## 2018-06-24 MED ORDER — PIPERACILLIN-TAZOBACTAM 3.375 G IVPB
3.3750 g | Freq: Three times a day (TID) | INTRAVENOUS | Status: DC
  Administered 2018-06-25 – 2018-06-29 (×15): 3.375 g via INTRAVENOUS
  Filled 2018-06-24 (×17): qty 50

## 2018-06-24 MED ORDER — SODIUM CHLORIDE 0.45 % IV SOLN
INTRAVENOUS | Status: DC
  Administered 2018-06-24 – 2018-06-27 (×6): via INTRAVENOUS

## 2018-06-24 NOTE — Progress Notes (Signed)
Occupational Therapy Session Note  Patient Details  Name: Mark Montoya MRN: [id number] Date of Birth: October 05, 1941  Today's Date: 06/24/2018 OT Individual Time: 9233-0076 OT Individual Time Calculation (min): 29 min  and Today's Date: 06/24/2018 OT Missed Time: 30 Minutes Missed Time Reason: Patient ill (comment)  Short Term Goals: Week 1:  OT Short Term Goal 1 (Week 1): Pt will complete BSC/toilet transfer with MAX A OT Short Term Goal 2 (Week 1): Pt will locate 3/5 needed ADL items on R side of sink wiht min VC OT Short Term Goal 3 (Week 1): Pt will sit to stand wiht MOD A in prep for clothing managment OT Short Term Goal 4 (Week 1): pt will complete 2/4 steps of donning shirt  Skilled Therapeutic Interventions/Progress Updates:    Pt greeted semi-reclined in bed with antibiotics running in IV. Per nurse, pt has been running fever and not feeling well today. Pt agreeable to sit EOB to don pants. Pt follows commands inconsistently and needs multimodal cues to initiate sitting. Once at EOB pt needed supervision/CGA for sitting balance. Utilized backwards chaining to initiate LB dressing with assistance to thread LLE into pant legs, then pt able to thread R LE with OT providing min A for sitting balance. Sit<>stand with RW with increased time, verbal cues for hand placement and mod A. Mod A for balance while OT pulled pants over hips. Pt returned to sitting and stated " I feel funny," and began laying down. Pt left semi-reclined in bed with bed alarm on and needs met.   Therapy Documentation Precautions:  Precautions Precautions: Fall Restrictions Weight Bearing Restrictions: No General: General OT Amount of Missed Time: 30 Minutes Vital Signs: Therapy Vitals BP: (!) 131/56 Pain:  denies pain See Function Navigator for Current Functional Status.   Therapy/Group: Individual Therapy  Valma Cava 06/24/2018, 9:34 AM

## 2018-06-24 NOTE — Significant Event (Signed)
Hypoglycemic Event  CBG: 68  Treatment: 15 GM carbohydrate snack  Symptoms: None  Follow-up CBG: Time:1817 CBG Result:104  Possible Reasons for Event: Unknown  Comments/MD notified:Pamela Love PA notified. Hold Metformin and glipizide    Whitni Pasquini, Sylvie Farriershley Marie

## 2018-06-24 NOTE — Progress Notes (Signed)
Patient temp was 102.5, patient little lethargic and confused, On-call notified, multiple orders given by on-call (see manage order), orders carried out per protocol. We continue to monitor.

## 2018-06-24 NOTE — Progress Notes (Signed)
Speech Language Pathology Daily Session Note  Patient Details  Name: Elgie Collardarl F Trevor MRN: [id number] Date of Birth: [birth year]  Today's Date: 06/24/2018 SLP Individual Time: 0720-0815 SLP Individual Time Calculation (min): 55 min  Short Term Goals: Week 1: SLP Short Term Goal 1 (Week 1): Patient will consume current diet with minimal overt s/s of aspiration with Min A verbal cues for use of swallowing strategies.  SLP Short Term Goal 2 (Week 1): Patient will demonstrate sustained attention to functional tasks for 10 minutes with Mod A verbal cues for redirection.  SLP Short Term Goal 3 (Week 1): Patient will self-monitor verbal errors at the phrase level with Mod A multimodal cues.   Skilled Therapeutic Interventions: Skilled treatment session focused on dysphagia and language goals. SLP facilitated session by providing set-up assist and Max A verbal cues for problem solving and attention to RUE during self-feeding. Patient consumed minimal amounts of PO intake with breakfast meal of Dys. 1 textures with honey-thick liquids without overt s/s of aspiration. However, patient with intermittent grimacing when swallowing, suspect due to either pain or dislike of food. Patient reported minimal sleep last night and indicated he did not feel well. Patient with less intelligible speech today but demonstrated increased attention to tasks like self-feeding requiring only Min A verbal cues for redirection. Thorough and extensive oral care provided due to poor condition of oral cavity. Patient left supine in bed with alarm on and all needs within reach. Continue with current plan of care.      Function:  Eating Eating   Modified Consistency Diet: Yes Eating Assist Level: Set up assist for;Supervision or verbal cues;Helper checks for pocketed food;More than reasonable amount of time;Help managing cup/glass   Eating Set Up Assist For: Opening containers       Cognition Comprehension Comprehension assist  level: Understands basic 25 - 49% of the time/ requires cueing 50 - 75% of the time  Expression   Expression assist level: Expresses basic 25 - 49% of the time/requires cueing 50 - 75% of the time. Uses single words/gestures.  Social Interaction Social Interaction assist level: Interacts appropriately 25 - 49% of time - Needs frequent redirection.  Problem Solving Problem solving assist level: Solves basic less than 25% of the time - needs direction nearly all the time or does not effectively solve problems and may need a restraint for safety  Memory Memory assist level: Recognizes or recalls less than 25% of the time/requires cueing greater than 75% of the time    Pain No/Denies Pain   Therapy/Group: Individual Therapy  Asencion Guisinger 06/24/2018, 3:30 PM

## 2018-06-24 NOTE — Progress Notes (Signed)
Physical Therapy Session Note  Patient Details  Name: Mark Montoya MRN: [id number] Date of Birth: [birth year]  Today's Date: 06/24/2018 PT Individual Time: 1103-1200 PT Individual Time Calculation (min): 57 min   Short Term Goals: Week 1:  PT Short Term Goal 1 (Week 1): Pt will consistenty perform bed mobility with mod assist consistently  PT Short Term Goal 2 (Week 1): [facility]  PT Short Term Goal 3 (Week 1): Pt will ambulate >11525ft with mod assist and LRAD  PT Short Term Goal 4 (Week 1): pt will maintain standing balance with min assist x 2 min   Skilled Therapeutic Interventions/Progress Updates: Pt presented in bed sleeping but easily aroused but lethargic. Pt required increased effort for following cues and sustaining tasks this session. Pt required minA with increased time for supine to sit with assist for truncal support and mod multimodal cues for sequencing. Pt required increased time for stand pivot transfer with RW with modA for sit to stand from EOB. Pt transported to rehab gym and attempted to perform sit to stand and toe taps from w/c. Pt with increased difficulty following single step commands and at times would close eyes. Pt was able to minimally participate in LAQ and seated march with 1# wt, with max multimodal cues for completion of task. Pt transported to day room and participated in UBE x 3 min with PTA inially providing HOH assist. Pt returned to room and perfromed stand pivot transfer back to bed with RW with increased time in same manner as prior. Required modA sit to supine at EOB with pt initially laying straight back with nsg present blocking head from hitting bed rail. PTA provided modA for BLE management and truncal support. Pt positioned for comfort and left with nsg and transport to take to x-ray.      Therapy Documentation Precautions:  Precautions Precautions: Fall Restrictions Weight Bearing Restrictions:  No General:   Vital Signs: Therapy Vitals Temp: 99 F (37.2 C) Temp Source: Oral Pulse Rate: 76 Resp: 18 BP: 122/68 Patient Position (if appropriate): Lying Oxygen Therapy SpO2: 95 % O2 Device: Room Air  See Function Navigator for Current Functional Status.   Therapy/Group: Individual Therapy  Christyne Mccain  Casin Federici, PTA  06/24/2018, 3:15 PM

## 2018-06-24 NOTE — Significant Event (Signed)
Hypoglycemic Event  CBG: 51  Treatment: 1 tube instant glucose  Symptoms: None  Follow-up CBG: Time:1226 CBG Result:51  Possible Reasons for Event: Unknown  Comments/MD notified:To treat with meal when back to floor.    Shaolin Armas, Sylvie FarrierAshley Montoya  Adult (Non-Pregnant) Hypoglycemia Standing Orders  1.  RN shall initiate Hypoglycemia Standing Orders for emergency measures immediately when:            w Routine or STAT CBG and/or a lab glucose indicates hypoglycemia (CBG < 70 mg/dl)  2.  Treat the patient according to ability to take PO's and severity of hypoglycemia.   3.  If patient is on GlucoStabilizer, follow directions provided [facility] for hypoglycemic events.  4.  If patient on insulin pump, follow Hypoglycemia Standing Orders.  If patient requires more than one treatment have patient place pump in SUSPEND and notify MD.  DO NOT leave pump in SUSPEND for greater than 30 minutes unless ordered by MD.  A.  Treatment for Mild or Moderate-Patient cooperative and able to swallow    1.  Patient taking PO's and can cooperate   a.  Give one of the following 15 gram CHO options:                           w 1 tube oral dextrose gel                           w 3-4 Glucose tablets                           w 4 oz. Juice                           w 4 oz. regular soda                                    ESRD patients:  clear, regular soda                           w 8 oz. skim milk    b.  Recheck CBG in 15 minutes after treatment                            w If CBG < 70 mg/dl, repeat treatment and recheck until hypoglycemia is resolved                            w If CBG > 70 mg/dl and next meal is more than 1 hour away, give additional 15 grams CHO   2.  Patient NPO-Patient cooperative and no altered mental status    a.  Give 25 ml of D50 IV.   b.  Recheck CBG in 15 minutes after treatment.                             w If CBG is less than 70 mg/dl, repeat treatment and  recheck until hypoglycemia is resolved.   c.  Notify MD for further orders.             SPECIAL  CONSIDERATIONS:    a.  If no IV access,                              w Start IV of D5W at [facility]                             w Give 25 ml of D50 IV.    b.  If unable to gain IV access                             w Give Glucagon IM:    i.  1 mg if patient weighs more than 45.5 kg    ii.  0.5 mg if patient weighs less than 45.5 kg   c.  Notify MD for further orders  B.  Treatment for Severe-- Patient unconscious or unable to take PO's safely    1.  Position patient on side   2.  Give 50 ml D50 IV   3.  Recheck CBG in 15 minutes.                    w If CBG is less than 70 mg/dl, repeat treatment and recheck until hypoglycemia is resolved.   4.  Notify MD for further orders.    SPECIAL CONSIDERATIONS:    a.  If no IV access                              w Give Glucagon IM                                        i.  1 mg if patient weighs more than 45.5 kg                                       ii.  0.5 mg if patient weighs less than 45.5 kg                              w Start IV of D5W at 50 ml/hr and give 50 ml D50 IV   b.  If no IV access and active seizure                               w Call Rapid Response   c.  If unable to gain IV access, give Glucagon IM:                              w 1 mg if patient weighs more than 45.5 kg                              w 0.5 mg if patient weighs less than 45.5 kg   d.  Notify MD for further orders.  C.  Complete smart text progress note to document intervention and follow-up CBG   1.  In [facility]  patient chart, click on Notes (left side of screen)   2.  Create Progress Note   3.  Click on [employer].  In the Match box type "hypo" and enter    4.  Double click on [facility] IP HYPOGLYCEMIC EVENT and enter data   5.  MD must be notified if patient is NPO or experienced severe hypoglycemia

## 2018-06-24 NOTE — Significant Event (Signed)
Hypoglycemic Event  CBG: 51  Treatment: 15 GM carbohydrate snack  Symptoms: None  Follow-up CBG: Time:1317 CBG Result:84  Possible Reasons for Event: Unknown  Comments/MD notified:Dan Anguilli PA. No new orders received.    [name], Sylvie FarrierAshley Marie  Adult (Non-Pregnant) Hypoglycemia Standing Orders  1.  RN shall initiate Hypoglycemia Standing Orders for emergency measures immediately when:            w Routine or STAT CBG and/or a lab glucose indicates hypoglycemia (CBG < 70 mg/dl)  2.  Treat the patient according to ability to take PO's and severity of hypoglycemia.   3.  If patient is on GlucoStabilizer, follow directions provided by the [facility] for hypoglycemic events.  4.  If patient on insulin pump, follow Hypoglycemia Standing Orders.  If patient requires more than one treatment have patient place pump in SUSPEND and notify MD.  DO NOT leave pump in SUSPEND for greater than 30 minutes unless ordered by MD.  A.  Treatment for Mild or Moderate-Patient cooperative and able to swallow    1.  Patient taking PO's and can cooperate   a.  Give one of the following 15 gram CHO options:                           w 1 tube oral dextrose gel                           w 3-4 Glucose tablets                           w 4 oz. Juice                           w 4 oz. regular soda                                    ESRD patients:  clear, regular soda                           w 8 oz. skim milk    b.  Recheck CBG in 15 minutes after treatment                            w If CBG < 70 mg/dl, repeat treatment and recheck until hypoglycemia is resolved                            w If CBG > 70 mg/dl and next meal is more than 1 hour away, give additional 15 grams CHO   2.  Patient NPO-Patient cooperative and no altered mental status    a.  Give 25 ml of D50 IV.   b.  Recheck CBG in 15 minutes after treatment.                             w If CBG is less than 70 mg/dl, repeat treatment  and recheck until hypoglycemia is resolved.   c.  Notify MD for further orders.             SPECIAL CONSIDERATIONS:  a.  If no IV access,                              w Start IV of D5W at [facility]                             w Give 25 ml of D50 IV.    b.  If unable to gain IV access                             w Give Glucagon IM:    i.  1 mg if patient weighs more than 45.5 kg    ii.  0.5 mg if patient weighs less than 45.5 kg   c.  Notify MD for further orders  B.  Treatment for Severe-- Patient unconscious or unable to take PO's safely    1.  Position patient on side   2.  Give 50 ml D50 IV   3.  Recheck CBG in 15 minutes.                    w If CBG is less than 70 mg/dl, repeat treatment and recheck until hypoglycemia is resolved.   4.  Notify MD for further orders.    SPECIAL CONSIDERATIONS:    a.  If no IV access                              w Give Glucagon IM                                        i.  1 mg if patient weighs more than 45.5 kg                                       ii.  0.5 mg if patient weighs less than 45.5 kg                              w Start IV of D5W at 50 ml/hr and give 50 ml D50 IV   b.  If no IV access and active seizure                               w Call Rapid Response   c.  If unable to gain IV access, give Glucagon IM:                              w 1 mg if patient weighs more than 45.5 kg                              w 0.5 mg if patient weighs less than 45.5 kg   d.  Notify MD for further orders.  C.  Complete smart text progress note to document intervention and follow-up CBG   1.  In [facility] patient chart, click on  Notes (left side of screen)   2.  Create Progress Note   3.  Click on [employer].  In the Match box type "hypo" and enter    4.  Double click on CH[facility] IP HYPOGLYCEMIC EVENT and enter data   5.  MD must be notified if patient is NPO or experienced severe hypoglycemia

## 2018-06-24 NOTE — Progress Notes (Signed)
Occupational Therapy Session Note  Patient Details  Name: Mark Montoya MRN: [id number] Date of Birth: 08/15/1941  Today's Date: 06/24/2018 OT Individual Time: 1400-1410 OT Individual Time Calculation (min): 10 min  and Today's Date: 06/24/2018 OT Missed Time: 20 Minutes Missed Time Reason: Patient ill (comment);Patient fatigue(lethargic, fever)  Short Term Goals: Week 1:  OT Short Term Goal 1 (Week 1): Pt will complete BSC/toilet transfer with MAX A OT Short Term Goal 2 (Week 1): Pt will locate 3/5 needed ADL items on R side of sink wiht min VC OT Short Term Goal 3 (Week 1): Pt will sit to stand wiht MOD A in prep for clothing managment OT Short Term Goal 4 (Week 1): pt will complete 2/4 steps of donning shirt  Skilled Therapeutic Interventions/Progress Updates:    Pt greeted semi-reclined in bed asleep. Pt able to wake to environmental changes and said " I ain't got much sleep." Tried to engage pt to come to sit EOB, but pt kept nodding off and unable to maintain alertness to participate. OT repositioned pt in bed and left pt with bed alarm on and needs met.   Therapy Documentation Precautions:  Precautions Precautions: Fall Restrictions Weight Bearing Restrictions: No General: General OT Amount of Missed Time: 20 Minutes Vital Signs: Pain: Denies pain See Function Navigator for Current Functional Status.   Therapy/Group: Individual Therapy   S  06/24/2018, 2:15 PM 

## 2018-06-24 NOTE — Plan of Care (Signed)
  Problem: RH BOWEL ELIMINATION Goal: RH STG MANAGE BOWEL WITH ASSISTANCE Description STG Manage Bowel with mod  Assistance.  Outcome: Not Progressing Goal: RH STG MANAGE BOWEL W/MEDICATION W/ASSISTANCE Description STG Manage Bowel with Medication with min  Assistance.  Outcome: Not Progressing Goal: RH STG MANAGE BOWEL W/EQUIPMENT W/ASSISTANCE Description STG Manage Bowel With Equipment With min Assistance  Outcome: Not Progressing Goal: RH OTHER STG BOWEL ELIMINATION GOALS W/ASSIST Description Other STG Bowel Elimination Goals With Assistance. Outcome: Not Progressing   Problem: RH BLADDER ELIMINATION Goal: RH STG MANAGE BLADDER WITH ASSISTANCE Description STG Manage Bladder With min  Assistance  Outcome: Not Progressing Goal: RH STG MANAGE BLADDER WITH MEDICATION WITH ASSISTANCE Description STG Manage Bladder With Medication With min Assistance.  Outcome: Not Progressing Goal: RH STG MANAGE BLADDER WITH EQUIPMENT WITH ASSISTANCE Description STG Manage Bladder With Equipment With Assistance Outcome: Not Progressing Goal: RH OTHER STG BLADDER ELIMINATION GOALS W/ASSIST Description Other STG Bladder Elimination Goals With Assistance Outcome: Not Progressing   Problem: RH SKIN INTEGRITY Goal: RH STG SKIN FREE OF INFECTION/BREAKDOWN Outcome: Not Progressing Goal: RH STG MAINTAIN SKIN INTEGRITY WITH ASSISTANCE Description STG Maintain Skin Integrity With min Assistance.  Outcome: Not Progressing Goal: RH OTHER STG SKIN INTEGRITY GOALS W/ASSIST Description Other STG Skin Integrity Goals With min  Assistance.  Outcome: Not Progressing   Problem: RH SAFETY Goal: RH STG ADHERE TO SAFETY PRECAUTIONS W/ASSISTANCE/DEVICE Description STG Adhere to Safety Precautions With min Assistance/Device.  Outcome: Not Progressing Goal: RH STG DECREASED RISK OF FALL WITH ASSISTANCE Description STG Decreased Risk of Fall With min Assistance.  Outcome: Not Progressing Goal: RH STG  DEMO UNDERSTANDING HOME SAFETY PRECAUTIONS Outcome: Not Progressing Goal: RH OTHER STG SAFETY GOALS W/ASSIST Description Other STG Safety Goals With Assistance. Outcome: Not Progressing   Problem: RH KNOWLEDGE DEFICIT GENERAL Goal: RH STG INCREASE KNOWLEDGE OF SELF CARE AFTER HOSPITALIZATION Outcome: Not Progressing   Problem: Consults Goal: RH BRAIN INJURY PATIENT EDUCATION Description Description: See Patient Education module for eduction specifics Outcome: Not Progressing

## 2018-06-24 NOTE — Progress Notes (Signed)
[facility] PHYSICAL MEDICINE & REHABILITATION     PROGRESS NOTE    Subjective/Complaints: Pt sitting in bed. Appears to be comfortable. Fever last night of 102.5  ROS: limited due to language/communication   Objective:  Dg Chest 2 View  Result Date: 06/23/2018 CLINICAL DATA:  Acute onset of fever. Recent cerebral hemorrhage. EXAM: CHEST - 2 VIEW COMPARISON:  Chest radiograph performed 06/20/2018 FINDINGS: The lungs are mildly hypoexpanded. Mild left basilar airspace opacity may reflect pneumonia. Mild vascular congestion is noted. No pleural effusion or pneumothorax is seen. The heart is normal in size; the patient is status post median sternotomy, with evidence of prior CABG. No acute osseous abnormalities are seen. IMPRESSION: Lungs mildly hypoexpanded. Mild left basilar airspace opacity may reflect pneumonia. Mild vascular congestion noted. Electronically Signed   By: Roanna RaiderJeffery  Chang M.D.   On: 06/23/2018 23:32   Recent Labs    06/23/18 2136 06/24/18 0608  WBC 20.6* 23.5*  HGB 10.3* 9.7*  HCT 32.2* 30.8*  PLT 170 167   Recent Labs    06/23/18 0533  NA 144  K 3.5  CL 108  GLUCOSE 179*  BUN 37*  CREATININE 1.65*  CALCIUM 9.0   CBG (last 3)  Recent Labs    06/23/18 1628 06/23/18 2224 06/24/18 0651  GLUCAP 164* 83 75    Wt Readings from Last 3 Encounters:  06/18/18 79.6 kg (175 lb 7.8 oz)  06/13/18 78.5 kg (173 lb 1 oz)  05/19/17 79.4 kg (175 lb)     Intake/Output Summary (Last 24 hours) at 06/24/2018 0843 Last data filed at 06/24/2018 0842 Gross per 24 hour  Intake 252.43 ml  Output -  Net 252.43 ml    Vital Signs: Blood pressure 119/60, pulse 70, temperature 99.1 F (37.3 C), temperature source Oral, resp. rate 16, height 5\' 7"  (1.702 m), weight 79.6 kg (175 lb 7.8 oz), SpO2 94 %. Physical Exam:  Constitutional: No distress . Vital signs reviewed. HEENT: EOMI, oral membranes moist, poor dentition Neck: supple Cardiovascular: RRR without murmur. No JVD     Respiratory: CTA Bilaterally without wheezes or rales. Normal effort    GI: BS +, non-tender, non-distended   Musculoskeletal:  No edema or tenderness in extremities  Neurological: He is alert.  Expressive aphasia.  Follows commands inconsistently  right inattention still present  RLE hyperrefexia  Skin: Scalp wound with scab.  Psychiatric:  Flat, cooperative     Assessment/Plan: 1. Functional deficits secondary to TBI which require 3+ hours per day of interdisciplinary therapy in a comprehensive inpatient rehab setting. Physiatrist is providing close team supervision and 24 hour management of active medical problems listed below. Physiatrist and rehab team continue to assess barriers to discharge/monitor patient progress toward functional and medical goals.  Function:  Bathing Bathing position   Position: Wheelchair/chair at sink  Bathing parts Body parts bathed by patient: Right upper leg, Left upper leg, Front perineal area, Right arm, Left arm, Chest, Abdomen Body parts bathed by helper: Right lower leg, Left lower leg, Buttocks  Bathing assist Assist Level: Touching or steadying assistance(Pt > 75%)      Upper Body Dressing/Undressing Upper body dressing   What is the patient wearing?: Pull over shirt/dress     Pull over shirt/dress - Perfomed by patient: Thread/unthread right sleeve, Thread/unthread left sleeve, Put head through opening Pull over shirt/dress - Perfomed by helper: Pull shirt over trunk        Upper body assist Assist Level: Touching or steadying assistance(Pt >  75%)      Lower Body Dressing/Undressing Lower body dressing   What is the patient wearing?: Pants, Non-skid slipper socks, Ted Hose     Pants- Performed by patient: Thread/unthread right pants leg, Thread/unthread left pants leg Pants- Performed by helper: Pull pants up/down   Non-skid slipper socks- Performed by helper: Don/doff right sock, Don/doff left sock               TED  Hose - Performed by helper: Don/doff right TED hose, Don/doff left TED hose  Lower body assist Assist for lower body dressing: Touching or steadying assistance (Pt > 75%)      Toileting Toileting Toileting activity did not occur: No continent bowel/bladder event Toileting steps completed by patient: Adjust clothing prior to toileting Toileting steps completed by helper: Adjust clothing prior to toileting, Performs perineal hygiene, Adjust clothing after toileting    Toileting assist Assist level: Two helpers   Transfers Chair/bed transfer Chair/bed transfer activity did not occur: Safety/medical concerns Chair/bed transfer method: Stand pivot, Squat pivot Chair/bed transfer assist level: Moderate assist (Pt 50 - 74%/lift or lower) Chair/bed transfer assistive device: Armrests     Locomotion Ambulation     Max distance: 150' Assist level: Moderate assist (Pt 50 - 74%)   Wheelchair   Type: Manual Max wheelchair distance: 100' Assist Level: Moderate assistance (Pt 50 - 74%)  Cognition Comprehension Comprehension assist level: Understands basic 50 - 74% of the time/ requires cueing 25 - 49% of the time  Expression Expression assist level: Expresses basic 25 - 49% of the time/requires cueing 50 - 75% of the time. Uses single words/gestures.  Social Interaction Social Interaction assist level: Interacts appropriately 25 - 49% of time - Needs frequent redirection.  Problem Solving Problem solving assist level: Solves basic 25 - 49% of the time - needs direction more than half the time to initiate, plan or complete simple activities  Memory Memory assist level: Recognizes or recalls 25 - 49% of the time/requires cueing 50 - 75% of the time   Medical Problem List and Plan: 1.  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  DVT Prophylaxis/Anticoagulation: Subcutaneous heparin initiated 06/17/2018.   Monitor for any bleeding episodes 3. Pain Management: Tylenol as needed 4. Mood: Aricept 10 mg nightly, Xanax 0.125 mg nightly, Zoloft 100 mg daily, Seroquel 12.5 mg twice daily 5. Neuropsych: This patient is not capable of making decisions on his own behalf. 6. Skin/Wound Care: Routine skin checks  -local care to scalp as needed  -continue staples   7. Fluids/Electrolytes/Nutrition: encourage PO     \ -BUN sl up today   -I/O's sl negative yesterday---continue to monitor 8.  Dysphasia.  Dysphasia #1 honey thick liquid.  Follow-up speech therapy 9.  Hypertension.  Clonidine 0.3 mg TID, hydralazine 50 mg every 8 hours, Norvasc 10 mg daily.   -good control 8/5 10.  Diabetes mellitus.  Hemoglobin A1c 7.0.  Glucotrol 5 mg daily, Glucophage 1000 mg twice daily.    -Continued improvements improvement over the weekend  -hold on changes at present  -cover with SSI 11.  CKD stage III.  Creatinine baseline 2.07. (37/1.65 8/5)  -continue to monitor serially---recheck Wednesday  -can DC NSL 12.  BPH.  Flomax 0.8 mg daily.    13.  CAD with CABG 2003.  Aspirin currently on hold due to left temporal lobe hemorrhage.  No chest pain or shortness  of breath 13. ABLA: up to 10.2 8/2   -stool OB negative   -continue to follow 14. Fever 102.5 overnight  -WBC's up to 23.5k this am  -exam is not impressive but cxr suggests LLL pneumonia   -?aspiration   -continue zosyn   -ua neg, ucx pending, dopplers negative   -check orthopanogram as well given horrible dentition   -Continue incentive spirometry and observe     LOS (Days) 6 A FACE TO FACE EVALUATION WAS PERFORMED  Ranelle Oyster, MD 06/24/2018 8:43 AM

## 2018-06-25 ENCOUNTER — Inpatient Hospital Stay (HOSPITAL_COMMUNITY): Payer: Medicare Other

## 2018-06-25 ENCOUNTER — Inpatient Hospital Stay (HOSPITAL_COMMUNITY): Payer: Medicare Other | Admitting: Occupational Therapy

## 2018-06-25 ENCOUNTER — Inpatient Hospital Stay (HOSPITAL_COMMUNITY): Payer: Medicare Other | Admitting: Speech Pathology

## 2018-06-25 LAB — BASIC METABOLIC PANEL
ANION GAP: 12 (ref 5–15)
BUN: 49 mg/dL — ABNORMAL HIGH (ref 8–23)
CALCIUM: 8.1 mg/dL — AB (ref 8.9–10.3)
CO2: 25 mmol/L (ref 22–32)
Chloride: 106 mmol/L (ref 98–111)
Creatinine, Ser: 2.08 mg/dL — ABNORMAL HIGH (ref 0.61–1.24)
GFR, EST AFRICAN AMERICAN: 33 mL/min — AB (ref >60)
GFR, EST NON AFRICAN AMERICAN: 29 mL/min — AB (ref >60)
Glucose, Bld: 105 mg/dL — ABNORMAL HIGH (ref 70–99)
Potassium: 3.1 mmol/L — ABNORMAL LOW (ref 3.5–5.1)
SODIUM: 143 mmol/L (ref 135–145)

## 2018-06-25 LAB — CBC
HCT: 28.8 % — ABNORMAL LOW (ref 39.0–52.0)
Hemoglobin: 9.2 g/dL — ABNORMAL LOW (ref 13.0–17.0)
MCH: 30.4 pg (ref 26.0–34.0)
MCHC: 31.9 g/dL (ref 30.0–36.0)
MCV: 95 fL (ref 78.0–100.0)
PLATELETS: 150 10*3/uL (ref 150–400)
RBC: 3.03 MIL/uL — ABNORMAL LOW (ref 4.22–5.81)
RDW: 13.9 % (ref 11.5–15.5)
WBC: 11.5 10*3/uL — AB (ref 4.0–10.5)

## 2018-06-25 LAB — URINE CULTURE: CULTURE: NO GROWTH

## 2018-06-25 LAB — GLUCOSE, CAPILLARY
GLUCOSE-CAPILLARY: 105 mg/dL — AB (ref 70–99)
GLUCOSE-CAPILLARY: 198 mg/dL — AB (ref 70–99)
GLUCOSE-CAPILLARY: 235 mg/dL — AB (ref 70–99)
GLUCOSE-CAPILLARY: 145 mg/dL — AB (ref 70–99)

## 2018-06-25 MED ORDER — POTASSIUM CHLORIDE CRYS ER 20 MEQ PO TBCR
20.0000 meq | EXTENDED_RELEASE_TABLET | Freq: Two times a day (BID) | ORAL | Status: DC
  Administered 2018-06-25 (×2): 20 meq via ORAL
  Filled 2018-06-25 (×2): qty 1

## 2018-06-25 NOTE — Progress Notes (Signed)
Occupational Therapy Session Note  Patient Details  Name: Mark Montoya MRN: [id number] Date of Birth: 1941-09-04  Today's Date: 06/25/2018  Session 1 OT Individual Time: 1133-1200 OT Individual Time Calculation (min): 27 min   Session 2 OT Individual Time: 1425-1450 OT Individual Time Calculation (min): 25 min   Short Term Goals: Week 1:  OT Short Term Goal 1 (Week 1): Pt will complete BSC/toilet transfer with MAX A OT Short Term Goal 2 (Week 1): Pt will locate 3/5 needed ADL items on R side of sink wiht min VC OT Short Term Goal 3 (Week 1): Pt will sit to stand wiht MOD A in prep for clothing managment OT Short Term Goal 4 (Week 1): pt will complete 2/4 steps of donning shirt  Skilled Therapeutic Interventions/Progress Updates:  Session 1   Pt greeted sitting in wc and agreeable to OT treatment session. B UE coordination with wc propulsion with pt needing multimodal cues and mod A to coordination wc push. Dynavision actvivity with pt needing max cues to move to next light. Turned off overhead light and pt able to maintain attention to hit 2 buttons in a row with min cues 2/2 decreased visual distractions. Pt returned to room and left seated in wc with safety belt on and needs met awaiting lunch.   Session 2 Pt greeted sitting in wc. When asked pt if he needed to go to the bathroom, he stated " I reckon I better try." Stand-pivot to toilet completed with increased time and overall mod A. Verbal cues for hand placement to turn and sit onto commode. Min A/mod A for balance while pt managed clothing w/o UE support. Pt with large continent BM. Pt needed assistance for peri-care and clothing management s/p BM. Stand-pivot back to bed at end of session with mod A and facilitation for pivot. Pt left semi-reclined in bed with needs met.   Therapy Documentation Precautions:  Precautions Precautions: Fall Restrictions Weight Bearing Restrictions: No Pain: None/denies pain See Function  Navigator for Current Functional Status.  Therapy/Group: Individual Therapy  Valma Cava 06/25/2018, 3:37 PM

## 2018-06-25 NOTE — Progress Notes (Signed)
Speech Language Pathology Daily Session Note  Patient Details  Name: Mark Montoya F Eklund MRN: [id number] Date of Birth: 1941/02/22  Today's Date: 06/25/2018 SLP Individual Time: 1400-1420 SLP Individual Time Calculation (min): 20 min  Short Term Goals: Week 2: SLP Short Term Goal 1 (Week 2): Patient will consume current diet with minimal overt s/s of aspiration with Min A verbal cues for use of swallowing strategies.  SLP Short Term Goal 2 (Week 2): Patient will demonstrate sustained attention to functional tasks for 10 minutes with Mod A verbal cues for redirection.  SLP Short Term Goal 3 (Week 2): Patient will self-monitor and correct verbal erros at the word level with Max A multimodal cues.  SLP Short Term Goal 4 (Week 2): Patient will attend to right field of enviornment/body with functional and familiar tasks with Mod A verbal cues.  SLP Short Term Goal 5 (Week 2): Patient will follow basic commands with functional and familiar tasks with Mod A multimodal cues.  SLP Short Term Goal 6 (Week 2): Patient will answer basic yes/no questions with Mod A verbal cues and 75% accuracy.   Skilled Therapeutic Interventions: Skilled treatment session focused on dysphagia goals. SLP facilitated session by providing oral care and then allowing pt to feed himself ice chips. Pt with intermittent confusion c/b attempting to drink from spoon. Pt with no overt s/s of aspiration. SLP further facilitated session by providing skilled observation of pt consuming thin water via cup. Despite Max A verbal cues, pt consumed 8 oz in consecutive sips. No overt s/s of aspiration observed. MBS planned for 06/26/18 to assess for possibility of further diet upgrade.      Function:  Eating Eating   Modified Consistency Diet: Yes Eating Assist Level: Set up assist for;Supervision or verbal cues;Helper checks for pocketed food;More than reasonable amount of time   Eating Set Up Assist For: Opening containers        Cognition Comprehension Comprehension assist level: Understands basic 25 - 49% of the time/ requires cueing 50 - 75% of the time  Expression   Expression assist level: Expresses basic 25 - 49% of the time/requires cueing 50 - 75% of the time. Uses single words/gestures.  Social Interaction Social Interaction assist level: Interacts appropriately 25 - 49% of time - Needs frequent redirection.  Problem Solving Problem solving assist level: Solves basic 25 - 49% of the time - needs direction more than half the time to initiate, plan or complete simple activities  Memory Memory assist level: Recognizes or recalls 25 - 49% of the time/requires cueing 50 - 75% of the time    Pain    Therapy/Group: Individual Therapy  Teanna Elem 06/25/2018, 3:14 PM
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
## 2018-06-25 NOTE — Progress Notes (Signed)
Speech Language Pathology Weekly Progress and Session Note  Patient Details  Name: NILO FALLIN MRN: [id number] Date of Birth: 1941/04/25  Beginning of progress report period: June 19, 2018 End of progress report period: June 25, 2018  Today's Date: 06/25/2018 SLP Individual Time: 3474-2595 SLP Individual Time Calculation (min): 45 min  Short Term Goals: Week 1: SLP Short Term Goal 1 (Week 1): Patient will consume current diet with minimal overt s/s of aspiration with Min A verbal cues for use of swallowing strategies.  SLP Short Term Goal 1 - Progress (Week 1): Not met SLP Short Term Goal 2 (Week 1): Patient will demonstrate sustained attention to functional tasks for 10 minutes with Mod A verbal cues for redirection.  SLP Short Term Goal 2 - Progress (Week 1): Not met SLP Short Term Goal 3 (Week 1): Patient will self-monitor verbal errors at the phrase level with Mod A multimodal cues.  SLP Short Term Goal 3 - Progress (Week 1): Not met    New Short Term Goals: Week 2: SLP Short Term Goal 1 (Week 2): Patient will consume current diet with minimal overt s/s of aspiration with Min A verbal cues for use of swallowing strategies.  SLP Short Term Goal 2 (Week 2): Patient will demonstrate sustained attention to functional tasks for 10 minutes with Mod A verbal cues for redirection.  SLP Short Term Goal 3 (Week 2): Patient will self-monitor and correct verbal erros at the word level with Max A multimodal cues.  SLP Short Term Goal 4 (Week 2): Patient will attend to right field of enviornment/body with functional and familiar tasks with Mod A verbal cues.  SLP Short Term Goal 5 (Week 2): Patient will follow basic commands with functional and familiar tasks with Mod A multimodal cues.  SLP Short Term Goal 6 (Week 2): Patient will answer basic yes/no questions with Mod A verbal cues and 75% accuracy.   Weekly Progress Updates: Patient has made slow and inconsistent gains and has not met any  STGs this reporting period. Currently, patient is consuming Dys. 1 textures with honey-thick liquids without overt s/s of aspiration but continues to require overall Max A multimodal cues to minimize oral holding, clear oral residue and to self-monitor and correct a slow rate of self-feeding with utilization of small bites/sips. Patient also continues to require overall Max-Total A to express basic wants/needs in regards to self-monitoring and correcting verbal errors at the phrase level with intermittent islands of intelligible speech with mostly automatic phrases. Patient continues to demonstrate behaviors with a Rancho Level V-VI and demonstrates intermittent confusion with disorientation, decreased sustained attention, decreased attention to the right, decreased problem solving, decreased awareness and decreased recall which impacts his ability to complete functional and familiar tasks safely. Patient's cognitive impairments are further exacerbated by baseline dementia. Patient and family education is ongoing. Patient would benefit from continued skilled SLP intervention to maximize his cognitive-linguistic and swallowing function prior to discharge.      Intensity: Minumum of 1-2 x/day, 30 to 90 minutes Frequency: 3 to 5 out of 7 days Duration/Length of Stay: 8/22 Treatment/Interventions: Cognitive remediation/compensation;Environmental controls;Internal/external aids;Speech/Language facilitation;Therapeutic Activities;Patient/family education;Functional tasks;Dysphagia/aspiration precaution training;Cueing hierarchy   Daily Session  Skilled Therapeutic Interventions: Skilled treatment session focused on dysphagia and speech goals. Upon arrival, patient was awake while upright in bed. SLP facilitated session by providing Min A verbal cues for patient to utilize swallowing compensatory strategies and to minimize talking with a full oral cavity with breakfast meal of Dys.  1 textures with honey-thick  liquids. Patient consumed meal without overt s/s of aspiration but demonstrated decreased PO intake due to not liking the taste of the food. Patient with increased verbal expression and overall intelligibility this session, especially when verbalizing wants/needs at the phrase and sentence level. Patient was ~75% intelligible and was able to make his needs known. Patient with increased awareness of errors but continues to require total A to self-correct. Patient left upright in bed with NT present. Continue with current plan of care.       Function:    Cognition Comprehension Comprehension assist level: Understands basic 25 - 49% of the time/ requires cueing 50 - 75% of the time  Expression   Expression assist level: Expresses basic 25 - 49% of the time/requires cueing 50 - 75% of the time. Uses single words/gestures.  Social Interaction Social Interaction assist level: Interacts appropriately 25 - 49% of time - Needs frequent redirection.  Problem Solving Problem solving assist level: Solves basic 25 - 49% of the time - needs direction more than half the time to initiate, plan or complete simple activities  Memory Memory assist level: Recognizes or recalls 25 - 49% of the time/requires cueing 50 - 75% of the time   Pain No/Denies Pain   Therapy/Group: Individual Therapy  Silvio Sausedo 06/25/2018, 12:36 PM

## 2018-06-25 NOTE — Progress Notes (Signed)
[place] PHYSICAL MEDICINE & REHABILITATION     PROGRESS NOTE    Subjective/Complaints: Eating magic cup with SLP when I entered. Fussing about wanting to go home  ROS: limited due to language/communication   Objective:  Dg Orthopantogram  Result Date: 06/24/2018 CLINICAL DATA:  Port intention.  Fever. EXAM: ORTHOPANTOGRAM/PANORAMIC COMPARISON:  CT 05/26/2018 FINDINGS: Patient has poor dentition. Prominent periapical lucencies noted about the lower incisors. Mild periapical lucencies noted about the remaining teeth. No evidence of fracture. Visualized paranasal sinuses clear. IMPRESSION: Patient's poor dentition. Prominent periapical lucencies noted about the lower incisors. Mild periapical lucencies noted about the remaining teeth. Electronically Signed   By: Maisie Fushomas  Register   On: 06/24/2018 12:35   Dg Chest 2 View  Result Date: 06/23/2018 CLINICAL DATA:  Acute onset of fever. Recent cerebral hemorrhage. EXAM: CHEST - 2 VIEW COMPARISON:  Chest radiograph performed 06/20/2018 FINDINGS: The lungs are mildly hypoexpanded. Mild left basilar airspace opacity may reflect pneumonia. Mild vascular congestion is noted. No pleural effusion or pneumothorax is seen. The heart is normal in size; the patient is status post median sternotomy, with evidence of prior CABG. No acute osseous abnormalities are seen. IMPRESSION: Lungs mildly hypoexpanded. Mild left basilar airspace opacity may reflect pneumonia. Mild vascular congestion noted. Electronically Signed   By: Roanna RaiderJeffery  Chang M.D.   On: 06/23/2018 23:32   Recent Labs    06/24/18 0608 06/25/18 0529  WBC 23.5* 11.5*  HGB 9.7* 9.2*  HCT 30.8* 28.8*  PLT 167 150   Recent Labs    06/23/18 0533 06/25/18 0529  NA 144 143  K 3.5 3.1*  CL 108 106  GLUCOSE 179* 105*  BUN 37* 49*  CREATININE 1.65* 2.08*  CALCIUM 9.0 8.1*   CBG (last 3)  Recent Labs    06/24/18 1817 06/24/18 2104 06/25/18 0629  GLUCAP 104* 77 105*    Wt Readings from  Last 3 Encounters:  06/25/18 80 kg (176 lb 5.9 oz)  06/13/18 78.5 kg (173 lb 1 oz)  05/19/17 79.4 kg (175 lb)     Intake/Output Summary (Last 24 hours) at 06/25/2018 0901 Last data filed at 06/25/2018 0500 Gross per 24 hour  Intake 1829.66 ml  Output -  Net 1829.66 ml    Vital Signs: Blood pressure (!) 120/50, pulse 60, temperature 98.4 F (36.9 C), temperature source Oral, resp. rate 16, height 5\' 7"  (1.702 m), weight 80 kg (176 lb 5.9 oz), SpO2 95 %. Physical Exam:  Constitutional: No distress . Vital signs reviewed. HEENT: EOMI, oral membranes moist Neck: supple Cardiovascular: RRR without murmur. No JVD    Respiratory: CTA Bilaterally without wheezes or rales. Normal effort    GI: BS +, non-tender, non-distended   Musculoskeletal:  No edema or tenderness in extremities  Neurological: He is more alert.  Expressive aphasia.  Follows commands inconsistently  right inattention still present  RLE hyperrefexia  Skin: Scalp wound with scab. Dry 4 staples Psychiatric:  Distracted, irritable    Assessment/Plan: 1. Functional deficits secondary to TBI which require 3+ hours per day of interdisciplinary therapy in a comprehensive inpatient rehab setting. Physiatrist is providing close team supervision and 24 hour management of active medical problems listed below. Physiatrist and rehab team continue to assess barriers to discharge/monitor patient progress toward functional and medical goals.  Function:  Bathing Bathing position   Position: Wheelchair/chair at sink  Bathing parts Body parts bathed by patient: Right upper leg, Left upper leg, Front perineal area, Right arm, Left arm,  Chest, Abdomen Body parts bathed by helper: Right lower leg, Left lower leg, Buttocks  Bathing assist Assist Level: Touching or steadying assistance(Pt > 75%)      Upper Body Dressing/Undressing Upper body dressing   What is the patient wearing?: Pull over shirt/dress     Pull over shirt/dress  - Perfomed by patient: Thread/unthread right sleeve, Thread/unthread left sleeve, Put head through opening Pull over shirt/dress - Perfomed by helper: Pull shirt over trunk        Upper body assist Assist Level: Touching or steadying assistance(Pt > 75%)      Lower Body Dressing/Undressing Lower body dressing   What is the patient wearing?: Pants     Pants- Performed by patient: Thread/unthread right pants leg Pants- Performed by helper: Thread/unthread left pants leg, Pull pants up/down   Non-skid slipper socks- Performed by helper: Don/doff right sock, Don/doff left sock               TED Hose - Performed by helper: Don/doff right TED hose, Don/doff left TED hose  Lower body assist Assist for lower body dressing: Touching or steadying assistance (Pt > 75%)      Toileting Toileting Toileting activity did not occur: No continent bowel/bladder event Toileting steps completed by patient: Adjust clothing prior to toileting Toileting steps completed by helper: Adjust clothing prior to toileting, Performs perineal hygiene, Adjust clothing after toileting    Toileting assist Assist level: Touching or steadying assistance (Pt.75%)   Transfers Chair/bed transfer Chair/bed transfer activity did not occur: Safety/medical concerns Chair/bed transfer method: Stand pivot Chair/bed transfer assist level: Moderate assist (Pt 50 - 74%/lift or lower) Chair/bed transfer assistive device: Armrests     Locomotion Ambulation     Max distance: 150' Assist level: Moderate assist (Pt 50 - 74%)   Wheelchair   Type: Manual Max wheelchair distance: 100' Assist Level: Moderate assistance (Pt 50 - 74%)  Cognition Comprehension Comprehension assist level: Understands basic 25 - 49% of the time/ requires cueing 50 - 75% of the time  Expression Expression assist level: Expresses basic 25 - 49% of the time/requires cueing 50 - 75% of the time. Uses single words/gestures.  Social Interaction  Social Interaction assist level: Interacts appropriately 25 - 49% of time - Needs frequent redirection.  Problem Solving Problem solving assist level: Solves basic 25 - 49% of the time - needs direction more than half the time to initiate, plan or complete simple activities  Memory Memory assist level: Recognizes or recalls 25 - 49% of the time/requires cueing 50 - 75% of the time   Medical Problem List and Plan: 1.  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  DVT Prophylaxis/Anticoagulation: Subcutaneous heparin initiated 06/17/2018.  Monitor for any bleeding episodes 3. Pain Management: Tylenol as needed 4. Mood: Aricept 10 mg nightly, Xanax 0.125 mg nightly, Zoloft 100 mg daily, Seroquel 12.5 mg twice daily 5. Neuropsych: This patient is not capable of making decisions on his own behalf. 6. Skin/Wound Care: Routine skin checks  -remove scalp staples     7. Fluids/Electrolytes/Nutrition: encourage PO     \ -BUN with further increase  -continue IVF as ordered   -discussed follow up MBS to see if we can advance diet 8.  Dysphasia.  Dysphasia #1 honey thick liquid.   doesn't like much 9.  Hypertension.  Clonidine 0.3 mg TID, hydralazine 50 mg every 8 hours, Norvasc 10 mg daily.   -  good control 8/7 10.  Diabetes mellitus.  Hemoglobin A1c 7.0.  Glucotrol 5 mg daily, Glucophage 1000 mg twice daily.    -Continued improvements improvement over the weekend  -hold on changes at present  -cover with SSI 11.  CKD stage III.  Creatinine baseline 2.07. (37/1.65 8/5)  -49/2.08 today---continue IVF 12.  BPH.  Flomax 0.8 mg daily.    13.  CAD with CABG 2003.  Aspirin currently on hold due to left temporal lobe hemorrhage.  No chest pain or shortness of breath 13. ABLA:     -stool OB negative, no signs of blood loss   -further dip to 9.2 today 8/7 14. Fever: down to 99 over night.   -clinically looks a little better/more alert  today  -WBC's up to 23.5k 8/6---down to 11.5 today  -exam is not impressive but cxr suggests LLL pneumonia, today's cxr without a lot of change   -?aspiration   -continue zosyn   -ua neg, ucx neg, dopplers negative   -orthopanogram with lucencies, no signs of osteo   -Continue incentive spirometry and observe     LOS (Days) 7 A FACE TO FACE EVALUATION WAS PERFORMED  Ranelle Oyster, MD 06/25/2018 9:01 AM

## 2018-06-25 NOTE — Progress Notes (Signed)
Social Work Patient ID: Elgie CollardEarl F Glaze, male   DOB: [birth year], 77 y.o.   MRN: [id number]  Have reviewed team conference with pt, grandson and granddaughter today.  Pt with poor attention still and cognitive impairments.  Grandchildren are aware and agreeable with targeted d/c date of 8/22 and supervision- min assist goals overall.  Discussed benefit of going ahead and coming in next week to begin family ed.  Granddaughter, Kennyth ArnoldStacy, will be here on Tuesday 9a-12p - have alerted therapies for scheduling.  Will follow up with her on that day as well.  Lisandra Mathisen, LCSW

## 2018-06-25 NOTE — Progress Notes (Signed)
Physical Therapy Session Note  Patient Details  Name: Mark Montoya MRN: [id number] Date of Birth: Oct 18, 1941  Today's Date: 06/25/2018 PT Individual Time: 0900-1015 PT Individual Time Calculation (min): 75 min   Short Term Goals: Week 1:  PT Short Term Goal 1 (Week 1): Pt will consistenty perform bed mobility with mod assist consistently  PT Short Term Goal 2 (Week 1): [facility] consistently  PT Short Term Goal 3 (Week 1): Pt will ambulate >12925ft with mod assist and LRAD  PT Short Term Goal 4 (Week 1): pt will maintain standing balance with min assist x 2 min     Skilled Therapeutic Interventions/Progress Updates: pt asleep in bed, but easily awakened.  Pt's bed and brief soaked with urine.  Bed mobility for doffing brief, hygiene and donning brief and gown, with VC, TCs. Pt sat up wihtout dizziness, and sat EOB x 5 minutes while PT donned TEDS and pt's shoes with max assist.  Stand pivot transfer to w/c with min assist, max cues for safety with RW.  Seated : 20 x 1 R/L marching, 10 x 1 each R/L long arc quad knee extension. Pt unable to perform bil heel raises due to aphasia, despite multimodal cues. neuromuscular re-education via multimodal cues and demo for altenating reciprocal movement, bil LEs , seated in w/c at [facility] at level 20 cm/sec, x 25 cycles x 2.  Gait training on level tile with RW, x 40' x 2, with min assist, mod assist for turns, with max cues for safe use of RW when turning.  Transitions were unsafe due to pt's aphasia regarding IV line.  Standing activity using R.L hands to assemble simple PVC puzzle, per picture.  Max assist needed to choose correct pieces, but pt able to assemble once PT handed him correct pieces.  Pt left resting in w/c with seat belt alarm applied and all needs within reach. Clydie BraunKaren, RN in room.     Therapy Documentation Precautions:  Precautions Precautions: Fall Restrictions Weight Bearing Restrictions:  No  Pain: none per pt     See Function Navigator for Current Functional Status.   Therapy/Group: Individual Therapy  Topacio Cella 06/25/2018, 12:21 PM

## 2018-06-26 ENCOUNTER — Inpatient Hospital Stay (HOSPITAL_COMMUNITY): Payer: Medicare Other

## 2018-06-26 ENCOUNTER — Inpatient Hospital Stay (HOSPITAL_COMMUNITY): Payer: Medicare Other | Admitting: Physical Therapy

## 2018-06-26 ENCOUNTER — Encounter (HOSPITAL_COMMUNITY): Payer: Medicare Other | Admitting: Speech Pathology

## 2018-06-26 ENCOUNTER — Inpatient Hospital Stay (HOSPITAL_COMMUNITY): Payer: Medicare Other | Admitting: Occupational Therapy

## 2018-06-26 LAB — CBC
HCT: 28.9 % — ABNORMAL LOW (ref 39.0–52.0)
Hemoglobin: 9 g/dL — ABNORMAL LOW (ref 13.0–17.0)
MCH: 29.5 pg (ref 26.0–34.0)
MCHC: 31.1 g/dL (ref 30.0–36.0)
MCV: 94.8 fL (ref 78.0–100.0)
PLATELETS: 151 10*3/uL (ref 150–400)
RBC: 3.05 MIL/uL — AB (ref 4.22–5.81)
RDW: 13.8 % (ref 11.5–15.5)
WBC: 8.4 10*3/uL (ref 4.0–10.5)

## 2018-06-26 LAB — BASIC METABOLIC PANEL
Anion gap: 11 (ref 5–15)
BUN: 45 mg/dL — AB (ref 8–23)
CO2: 27 mmol/L (ref 22–32)
CREATININE: 1.98 mg/dL — AB (ref 0.61–1.24)
Calcium: 8.5 mg/dL — ABNORMAL LOW (ref 8.9–10.3)
Chloride: 108 mmol/L (ref 98–111)
GFR, EST AFRICAN AMERICAN: 35 mL/min — AB (ref >60)
GFR, EST NON AFRICAN AMERICAN: 31 mL/min — AB (ref >60)
Glucose, Bld: 126 mg/dL — ABNORMAL HIGH (ref 70–99)
Potassium: 3.6 mmol/L (ref 3.5–5.1)
SODIUM: 146 mmol/L — AB (ref 135–145)

## 2018-06-26 LAB — GLUCOSE, CAPILLARY
GLUCOSE-CAPILLARY: 123 mg/dL — AB (ref 70–99)
GLUCOSE-CAPILLARY: 173 mg/dL — AB (ref 70–99)
GLUCOSE-CAPILLARY: 158 mg/dL — AB (ref 70–99)
Glucose-Capillary: 189 mg/dL — ABNORMAL HIGH (ref 70–99)

## 2018-06-26 MED ORDER — POTASSIUM CHLORIDE 20 MEQ/15ML (10%) PO SOLN
20.0000 meq | Freq: Two times a day (BID) | ORAL | Status: DC
  Administered 2018-06-26 – 2018-07-10 (×29): 20 meq via ORAL
  Filled 2018-06-26 (×29): qty 15

## 2018-06-26 NOTE — Progress Notes (Signed)
Physical Therapy Session Note  Patient Details  Name: Mark Montoya MRN: [id number] Date of Birth: Feb 21, 1941  Today's Date: 06/26/2018 PT Individual Time: 1000-1030 PT Individual Time Calculation (min): 30 min   Short Term Goals: Week 2:  PT Short Term Goal 1 (Week 2): pt will perform gait x 100' with min A in controlled environment PT Short Term Goal 2 (Week 2): pt will consistently perform functional transfers with min A  Skilled Therapeutic Interventions  Patient received in bed, sleeping but easily awoken and stating "I want to get out of this bed and move around". Able to complete functional bed mobility with ModA and stand-pivot transfer into wheelchair with ModA today, verbal and tactile cues, manual facilitation utilized to maintain safety during mobility today. Initiated gait approximately 42f in hallway, but gait distance cut short by brief starting to slide down legs during gait; MinA provided during gait for IV management and safety with assistive device. Returned to room and worked briefly on standing balance by having patient pull up brief without UE support and MinA to maintain balance during this activity. Worked on quad and ankle dorsiflexor strength as well. Patient left up in chair with lap belt alarm activated and all needs otherwise met this morning.   Therapy Documentation Precautions:  Precautions Precautions: Fall Restrictions Weight Bearing Restrictions: No General:   Vital Signs:  Pain: Pain Assessment Pain Scale: 0-10 Pain Score: 0-No pain    Therapy/Group: Individual Therapy   KDeniece ReePT, DPT, CBIS  Supplemental Physical Therapist [facility]  Pager [phone]

## 2018-06-26 NOTE — Progress Notes (Signed)
Physical Therapy Session Note  Patient Details  Name: Mark Montoya MRN: [id number] Date of Birth: Sep 21, 1941  Today's Date: 06/26/2018 PT Individual Time: 1300-1330 PT Individual Time Calculation (min): 30 min   Short Term Goals: Week 2:  PT Short Term Goal 1 (Week 2): pt will perform gait x 100' with min A in controlled environment PT Short Term Goal 2 (Week 2): pt will consistently perform functional transfers with min A  Skilled Therapeutic Interventions/Progress Updates:    Pt received semi-reclined in bed, agreeable to PT. No complaints of pain. Pt reports still feeling hungry after lunch, agreeable to get up to w/c to finish eating. Semi-reclined to sitting EOB with min A, max cues and encouragement as pt is easily distracted and unable to follow verbal commands consistently due to global aphasia. Sit to stand mod A to RW. SPT bed to w/c with RW and mod A, cues for safe transfer technique. Once seated in w/c pt declines to eat any more of his lunch. Seated BLE therex x 10-15 reps with max cues and demonstration for correct exercise performance. Pt left seated in w/c in room with needs in reach and quick-release belt in place.  Therapy Documentation Precautions:  Precautions Precautions: Fall Restrictions Weight Bearing Restrictions: No  See Function Navigator for Current Functional Status.   Therapy/Group: Individual Therapy  Peter Congoaylor Chakara Bognar, PT, DPT  06/26/2018, 3:34 PM

## 2018-06-26 NOTE — Progress Notes (Signed)
Pharmacy Antibiotic Note  Mark Montoya is a 77 y.o. male admitted on 06/18/2018 for rehab s/p left ventricular hemorrhage. Pharmacy has been consulted for Zosyn dosing for fever.  Afebrile, WBC decreased to wnl, SCr 1.98 (CKD III with BL SCr ~ 2) CrCl 31 mL/min.    Plan: Zosyn 3.375 g IV every 8 hours  Monitor renal function, clinical progress, de-escalation as able  Height: 5\' 7"  (170.2 cm) Weight: 176 lb 5.9 oz (80 kg) IBW/kg (Calculated) : 66.1  Temp (24hrs), Avg:98 F (36.7 C), Min:97.7 F (36.5 C), Max:98.5 F (36.9 C)  Recent Labs  Lab 06/20/18 0556 06/23/18 0533 06/23/18 2136 06/24/18 0608 06/25/18 0529 06/26/18 0600  WBC 9.9  --  20.6* 23.5* 11.5* 8.4  CREATININE 1.50* 1.65*  --   --  2.08* 1.98*    Estimated Creatinine Clearance: 31.2 mL/min (A) (by C-G formula based on SCr of 1.98 mg/dL (H)).    Allergies  Allergen Reactions  . Lipitor [Atorvastatin Calcium]   . Other     provastatin  . Simvastatin      Mark Montoya, PharmD Clinical Pharmacist Please check AMION for all Pharmacist numbers by unit 06/26/2018 9:21 AM

## 2018-06-26 NOTE — Progress Notes (Signed)
[place] PHYSICAL MEDICINE & REHABILITATION     PROGRESS NOTE    Subjective/Complaints: Up in bed. Says he slept better.   ROS: limited due to language/communication   Objective:  Dg Orthopantogram  Result Date: 06/24/2018 CLINICAL DATA:  Port intention.  Fever. EXAM: ORTHOPANTOGRAM/PANORAMIC COMPARISON:  CT 05/26/2018 FINDINGS: Patient has poor dentition. Prominent periapical lucencies noted about the lower incisors. Mild periapical lucencies noted about the remaining teeth. No evidence of fracture. Visualized paranasal sinuses clear. IMPRESSION: Patient's poor dentition. Prominent periapical lucencies noted about the lower incisors. Mild periapical lucencies noted about the remaining teeth. Electronically Signed   By: Maisie Fushomas  Register   On: 06/24/2018 12:35   Dg Chest 2 View  Result Date: 06/25/2018 CLINICAL DATA:  Pneumonia EXAM: CHEST - 2 VIEW COMPARISON:  Two days ago FINDINGS: Borderline heart size.  Aortic tortuosity.  Status post CABG. Indistinct opacity at the left base without definite volume loss is stable. No Kerley lines, effusion, or pneumothorax. IMPRESSION: Stable pneumonia or atelectasis at the left base. Electronically Signed   By: Marnee SpringJonathon  Watts M.D.   On: 06/25/2018 09:58   Recent Labs    06/25/18 0529 06/26/18 0600  WBC 11.5* 8.4  HGB 9.2* 9.0*  HCT 28.8* 28.9*  PLT 150 151   Recent Labs    06/25/18 0529 06/26/18 0600  NA 143 146*  K 3.1* 3.6  CL 106 108  GLUCOSE 105* 126*  BUN 49* 45*  CREATININE 2.08* 1.98*  CALCIUM 8.1* 8.5*   CBG (last 3)  Recent Labs    06/25/18 1657 06/25/18 2123 06/26/18 0644  GLUCAP 235* 145* 123*    Wt Readings from Last 3 Encounters:  06/25/18 80 kg  06/13/18 78.5 kg  05/19/17 79.4 kg     Intake/Output Summary (Last 24 hours) at 06/26/2018 0853 Last data filed at 06/25/2018 1819 Gross per 24 hour  Intake 490 ml  Output -  Net 490 ml    Vital Signs: Blood pressure (!) 149/72, pulse 60, temperature 98.5 F  (36.9 C), temperature source Oral, resp. rate 18, height 5\' 7"  (1.702 m), weight 80 kg, SpO2 96 %. Physical Exam:  Constitutional: No distress . Vital signs reviewed. HEENT: EOMI, oral membranes moist Neck: supple Cardiovascular: RRR without murmur. No JVD    Respiratory: CTA Bilaterally without wheezes or rales. Normal effort    GI: BS +, non-tender, non-distended   Musculoskeletal:  No edema or tenderness in extremities  Neurological: He is more alert.  Expressive aphasia.  Following commands more consistently  right inattention still present  RLE hyperrefexia  Skin: Scalp wound with scab. Dry 4 staples in scalp Psychiatric: less irritable, cooperative    Assessment/Plan: 1. Functional deficits secondary to TBI which require 3+ hours per day of interdisciplinary therapy in a comprehensive inpatient rehab setting. Physiatrist is providing close team supervision and 24 hour management of active medical problems listed below. Physiatrist and rehab team continue to assess barriers to discharge/monitor patient progress toward functional and medical goals.  Function:  Bathing Bathing position   Position: Wheelchair/chair at sink  Bathing parts Body parts bathed by patient: Right upper leg, Left upper leg, Front perineal area, Right arm, Left arm, Chest, Abdomen Body parts bathed by helper: Right lower leg, Left lower leg, Buttocks  Bathing assist Assist Level: Touching or steadying assistance(Pt > 75%)      Upper Body Dressing/Undressing Upper body dressing   What is the patient wearing?: Pull over shirt/dress     Pull over  shirt/dress - Perfomed by patient: Thread/unthread right sleeve, Thread/unthread left sleeve, Put head through opening Pull over shirt/dress - Perfomed by helper: Pull shirt over trunk        Upper body assist Assist Level: Touching or steadying assistance(Pt > 75%)      Lower Body Dressing/Undressing Lower body dressing   What is the patient wearing?:  Pants     Pants- Performed by patient: Thread/unthread right pants leg Pants- Performed by helper: Thread/unthread left pants leg, Pull pants up/down   Non-skid slipper socks- Performed by helper: Don/doff right sock, Don/doff left sock               TED Hose - Performed by helper: Don/doff right TED hose, Don/doff left TED hose  Lower body assist Assist for lower body dressing: Touching or steadying assistance (Pt > 75%)      Toileting Toileting Toileting activity did not occur: No continent bowel/bladder event Toileting steps completed by patient: Adjust clothing prior to toileting Toileting steps completed by helper: Performs perineal hygiene, Adjust clothing after toileting    Toileting assist Assist level: Touching or steadying assistance (Pt.75%)   Transfers Chair/bed transfer Chair/bed transfer activity did not occur: Safety/medical concerns Chair/bed transfer method: Stand pivot Chair/bed transfer assist level: Moderate assist (Pt 50 - 74%/lift or lower) Chair/bed transfer assistive device: Patent attorney     Max distance: 40 Assist level: Moderate assist (Pt 50 - 74%)   Wheelchair   Type: Manual Max wheelchair distance: 100' Assist Level: Moderate assistance (Pt 50 - 74%)  Cognition Comprehension Comprehension assist level: Understands basic 25 - 49% of the time/ requires cueing 50 - 75% of the time  Expression Expression assist level: Expresses basic 25 - 49% of the time/requires cueing 50 - 75% of the time. Uses single words/gestures.  Social Interaction Social Interaction assist level: Interacts appropriately 25 - 49% of time - Needs frequent redirection.  Problem Solving Problem solving assist level: Solves basic 25 - 49% of the time - needs direction more than half the time to initiate, plan or complete simple activities  Memory Memory assist level: Recognizes or recalls 25 - 49% of the time/requires cueing 50 - 75% of the time    Medical Problem List and Plan: 1.  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  DVT Prophylaxis/Anticoagulation: Subcutaneous heparin initiated 06/17/2018.  Monitor for any bleeding episodes 3. Pain Management: Tylenol as needed 4. Mood: Aricept 10 mg nightly, Xanax 0.125 mg nightly, Zoloft 100 mg daily, Seroquel 12.5 mg twice daily 5. Neuropsych: This patient is not capable of making decisions on his own behalf. 6. Skin/Wound Care: Routine skin checks  -remove scalp staples today     7. Fluids/Electrolytes/Nutrition: encourage PO     \ -BUN with further increase  -continue IVF as ordered   -  8.  Dysphasia.  Dysphasia #1 honey thick liquid.   doesn't like much  -MBS today 9.  Hypertension.  Clonidine 0.3 mg TID, hydralazine 50 mg every 8 hours, Norvasc 10 mg daily.   -good control 8/8 10.  Diabetes mellitus.  Hemoglobin A1c 7.0.  Glucotrol 5 mg daily, Glucophage 1000 mg twice daily.    -glucotrol 5mg  bid now   -cover with SSI 11.  CKD stage III.  Creatinine baseline 2.07. (37/1.65 8/5)  -continue IVF  -slow improvement (45/1.98) 12.  BPH.  Flomax 0.8 mg daily.    13.  CAD with CABG 2003.  Aspirin currently on hold due to left temporal lobe hemorrhage.  No chest pain or shortness of breath 13. ABLA:     -stool OB negative, no signs of blood loss   -further dip to 9.0 today 8/8. Probably dilutional component too with IVF   -continue to monitor 14. Fever: afebrile last 24   -clinically improving  -WBC's initially 23.5k 8/6---down to 8.4 8/8  -exam is not impressive but recent cxr suggest LLL pneumonia   -likely aspiration   -seems to be responding to zosyn---continue for 1 week   -ua neg, ucx neg, dopplers negative   -orthopanogram with lucencies, no signs of osteo         LOS (Days) 8 A FACE TO FACE EVALUATION WAS PERFORMED  Ranelle Oyster, MD 06/26/2018 8:53 AM

## 2018-06-26 NOTE — Progress Notes (Signed)
Occupational Therapy Weekly Progress Note  Patient Details  Name: Mark Montoya MRN: [id number] Date of Birth: [birth year]  Beginning of progress report period: June 19, 2018 End of progress report period: June 26, 2018  Today's Date: 06/26/2018 OT Individual Time: 1105-1200 OT Individual Time Calculation (min): 55 min   Patient has met 4 of 4 short term goals.  Pt is making steady progress with OT at this time. Pt is able to complete functional transfers with RW with consistent mod A and is working towards min A. Pt has demonstrated improved activity tolerance, attention, and initiation within BADL tasks. Sit<>stands are progressing as well as standing balance at min A overall. Continue current plan of care.   Patient continues to demonstrate the following deficits: muscle weakness, impaired timing and sequencing, unbalanced muscle activation, ataxia, decreased coordination and decreased motor planning, decreased attention to right, decreased initiation, decreased attention, decreased awareness, decreased problem solving, decreased safety awareness, decreased memory and delayed processing and decreased sitting balance, decreased standing balance, decreased postural control, hemiplegia and decreased balance strategies and therefore will continue to benefit from skilled OT intervention to enhance overall performance with BADL and Reduce care partner burden.  Patient progressing toward long term goals..  Continue plan of care.  OT Short Term Goals Week 1:  OT Short Term Goal 1 (Week 1): Pt will complete BSC/toilet transfer with MAX A OT Short Term Goal 1 - Progress (Week 1): Met OT Short Term Goal 2 (Week 1): Pt will locate 3/5 needed ADL items on R side of sink wiht min VC OT Short Term Goal 2 - Progress (Week 1): Met OT Short Term Goal 3 (Week 1): Pt will sit to stand wiht MOD A in prep for clothing managment OT Short Term Goal 3 - Progress (Week 1): Met OT Short Term Goal 4 (Week 1): pt will  complete 2/4 steps of donning shirt OT Short Term Goal 4 - Progress (Week 1): Met Week 2:  OT Short Term Goal 1 (Week 2): Pt will complete 1/3 toileting steps with touching assist OT Short Term Goal 2 (Week 2): Pt will complete LB dressing with no more than min A OT Short Term Goal 3 (Week 2): Pt will complete toilet transfer with consistent min A  Skilled Therapeutic Interventions/Progress Updates:    Pt greeted seated in wc handoff from PT session. Pt agreeable to bathing/dressing at the sink. Pt able to follow 75% of commands throughout familiar BADL tasks and initiated 50% without cuing. Sit<>stand with increased time to initiate. Once standing, pt reports need to urinate and begins urinating before OT could get urinal or get pt to the bathroom. Pt then begins having loose incontinent stool on the floor. Total A for peri-care and clean up s/p incontinent episode. Pt did tolerate standing for 10 mins using BUE support on sink while OT cleaned up. Pt able to don new socks with set-up A. He then complete stand-pivot back to bed with Min A. Pt left semi-reclined in bed with bed alarm on and needs met.   Therapy Documentation Precautions:  Precautions Precautions: Fall Restrictions Weight Bearing Restrictions: No Pain: Pain Assessment Pain Scale: 0-10 Pain Score: 0-No pain  See Function Navigator for Current Functional Status.   Therapy/Group: Individual Therapy  Valma Cava 06/26/2018, 11:59 AM

## 2018-06-26 NOTE — Progress Notes (Signed)
Modified Barium Swallow Progress Note  Patient Details  Name: Mark Montoya F Loney MRN: [id number] Date of Birth: [birth year]  Today's Date: 06/26/2018  Modified Barium Swallow completed.  Full report located under Chart Review in the Imaging Section.  Brief recommendations include the following:  Clinical Impression  Pt presents with severe oral phase and mild sensorimotor pharyngeal phase dysphagia. Pt's oral phase is c/b increased bolus cohesion and overall organization of bolus. Pt with no active chewing on trials of cracker with applesauce and swallowed cracker whole. Pt's pharyngeal phase is c/b delayed swallow initiation to pyriform sinuses and intermittently decreased epiglottic closure with thin liquids. Pt with consistent penetration with thin liquids via tsp or cup sips. When pt self-fed thin liquids via cup, he presented with deep penetration on ~ 50% of trials but ejected out during the swallow. On 3 instances, penetration reached level of vocal cords but was not aspirated. Pt sensed penetration x2. Despite penetration with thin liquids, pt was able to protect airway among all trials. Although pt was able to protect his airway wiht thin liquids, pt continues to be treated for pneumonia and recurrent chest x-rays. Additionally, pt has comorbidity of dementia and severe dental decay, along with acute severe cognitive deficits from recent fall. Therefore recommend dysphagia 1 diet with nectar thick liquids via cup, oral care prior to and after PO intake, medicine crushed in applesauce with water protocol initiated. Recommend SLP observing pt with full meal and thin liquids to observe for any fatigue/increase in s/s of penetration.    Swallow Evaluation Recommendations       SLP Diet Recommendations: Dysphagia 1 (Puree) solids;Nectar thick liquid   Liquid Administration via: Cup   Medication Administration: Crushed with puree   Supervision: Patient able to self feed;Full supervision/cueing for  compensatory strategies   Compensations: Minimize environmental distractions;Slow rate;Small sips/bites   Postural Changes: Remain semi-upright after after feeds/meals (Comment);Seated upright at 90 degrees   Oral Care Recommendations: Oral care BID;Oral care before and after PO   Other Recommendations: Order thickener from pharmacy    Lilliona Blakeney 06/26/2018,1:01 PM

## 2018-06-26 NOTE — Progress Notes (Signed)
Physical Therapy Weekly Progress Note  Patient Details  Name: Mark Montoya MRN: [id number] Date of Birth: [birth year]  Beginning of progress report period: June 19, 2018 End of progress report period: June 26, 2018   Patient has met 3 of 4 short term goals.  Pt improving functional strength and mobility. Pt continues to be inconsistent with transfers and gait, limited by decreased activity tolerance.  Patient continues to demonstrate the following deficits muscle weakness, impaired timing and sequencing, decreased coordination and decreased motor planning, decreased awareness, decreased problem solving, decreased safety awareness, decreased memory and delayed processing and decreased standing balance, decreased postural control and decreased balance strategies and therefore will continue to benefit from skilled PT intervention to increase functional independence with mobility.  Patient progressing toward long term goals..  Continue plan of care.  PT Short Term Goals Week 1:  PT Short Term Goal 1 (Week 1): Pt will consistenty perform bed mobility with mod assist consistently  PT Short Term Goal 1 - Progress (Week 1): Met PT Short Term Goal 2 (Week 1): Pt will transfer to [facility] with mod assist consistently  PT Short Term Goal 2 - Progress (Week 1): Met PT Short Term Goal 3 (Week 1): Pt will ambulate >145f with mod assist and LRAD  PT Short Term Goal 3 - Progress (Week 1): Progressing toward goal PT Short Term Goal 4 (Week 1): pt will maintain standing balance with min assist x 2 min  PT Short Term Goal 4 - Progress (Week 1): Met Week 2:  PT Short Term Goal 1 (Week 2): pt will perform gait x 100' with min A in controlled environment PT Short Term Goal 2 (Week 2): pt will consistently perform functional transfers with min A  Skilled Therapeutic Interventions/Progress Updates:  Ambulation/gait training;Balance/vestibular training;Cognitive remediation/compensation;Community  reintegration;Discharge planning;Disease management/prevention;Functional mobility training;Functional electrical stimulation;DME/adaptive equipment instruction;Neuromuscular re-education;Pain management;Patient/family education;Splinting/orthotics;Skin care/wound management;Psychosocial support;Stair training;Therapeutic Activities;Therapeutic Exercise;Visual/perceptual remediation/compensation;UE/LE Coordination activities;UE/LE Strength taining/ROM;Wheelchair propulsion/positioning     Kelechi Astarita 06/26/2018, 8:12 AM

## 2018-06-26 NOTE — Progress Notes (Addendum)
Physical Therapy Session Note  Patient Details  Name: Mark Montoya MRN: [id number] Date of Birth: 03-Aug-1941  Today's Date: 06/26/2018 PT Individual Time: 1040-1105 and 1355-1433 PT Individual Time Calculation (min): 25 min and 38 minutes  Short Term Goals: Week 2:  PT Short Term Goal 1 (Week 2): pt will perform gait x 100' with min A in controlled environment PT Short Term Goal 2 (Week 2): pt will consistently perform functional transfers with min A  Skilled Therapeutic Interventions/Progress Updates:  Session focused on gait training with obstacle negotiation. Pt performs sit to stand with min A with cues each repetition for UE placement.  Pt performs gait with min A for straight line, mod A for turns. Pt able to avoid increasing obstacles with supervision cuing. Pt continues to be mod A and max cues for safety when turning and when turning to sit.  W/c mobility with bilat UEs x 8075' with supervision. Pt handed off to OT at end of session.  Session 2: Pt with no c/o pain. Attempted to have pt engage in cognitive activity with peg board task. Pt initially able to follow mod/max cues to complete alternating color peg board task, then pt limited by increasingly distracting environment and c/o fatigue.  Pt then performs gait without AD with pt requiring mod cues for attention to task, mod A for gait without AD due to decreased attention and decreased awareness of LOB requiring mod A to correct.  Pt able to gait 50' x 2, 120' with min/mod A without AD.  attempted to have pt identify people in pictures in room.  Pt perseverating on "my wife" and identifies all in picture as his wife.  Pt able to perform sit to supine with min A. Pt left in bed with alarm set, needs at hand, RN present.  Therapy Documentation Precautions:  Precautions Precautions: Fall Restrictions Weight Bearing Restrictions: No Pain: Pain Assessment Pain Scale: 0-10 Pain Score: 0-No pain   Therapy/Group: Individual  Therapy  Calieb Lichtman 06/26/2018, 11:21 AM

## 2018-06-27 ENCOUNTER — Inpatient Hospital Stay (HOSPITAL_COMMUNITY): Payer: Medicare Other | Admitting: Occupational Therapy

## 2018-06-27 ENCOUNTER — Inpatient Hospital Stay (HOSPITAL_COMMUNITY): Payer: Medicare Other | Admitting: Physical Therapy

## 2018-06-27 ENCOUNTER — Inpatient Hospital Stay (HOSPITAL_COMMUNITY): Payer: Medicare Other | Admitting: Speech Pathology

## 2018-06-27 LAB — CBC
HEMATOCRIT: 30.3 % — AB (ref 39.0–52.0)
Hemoglobin: 9.5 g/dL — ABNORMAL LOW (ref 13.0–17.0)
MCH: 29.8 pg (ref 26.0–34.0)
MCHC: 31.4 g/dL (ref 30.0–36.0)
MCV: 95 fL (ref 78.0–100.0)
Platelets: 154 10*3/uL (ref 150–400)
RBC: 3.19 MIL/uL — ABNORMAL LOW (ref 4.22–5.81)
RDW: 13.6 % (ref 11.5–15.5)
WBC: 5.3 10*3/uL (ref 4.0–10.5)

## 2018-06-27 LAB — GLUCOSE, CAPILLARY
GLUCOSE-CAPILLARY: 127 mg/dL — AB (ref 70–99)
Glucose-Capillary: 148 mg/dL — ABNORMAL HIGH (ref 70–99)
Glucose-Capillary: 207 mg/dL — ABNORMAL HIGH (ref 70–99)
Glucose-Capillary: 194 mg/dL — ABNORMAL HIGH (ref 70–99)

## 2018-06-27 LAB — BASIC METABOLIC PANEL
Anion gap: 11 (ref 5–15)
BUN: 34 mg/dL — AB (ref 8–23)
CHLORIDE: 108 mmol/L (ref 98–111)
CO2: 26 mmol/L (ref 22–32)
Calcium: 8.6 mg/dL — ABNORMAL LOW (ref 8.9–10.3)
Creatinine, Ser: 1.91 mg/dL — ABNORMAL HIGH (ref 0.61–1.24)
GFR calc Af Amer: 37 mL/min — ABNORMAL LOW (ref >60)
GFR calc non Af Amer: 32 mL/min — ABNORMAL LOW (ref >60)
Glucose, Bld: 164 mg/dL — ABNORMAL HIGH (ref 70–99)
POTASSIUM: 3.8 mmol/L (ref 3.5–5.1)
SODIUM: 145 mmol/L (ref 135–145)

## 2018-06-27 MED ORDER — SODIUM CHLORIDE 0.45 % IV SOLN
INTRAVENOUS | Status: DC
  Administered 2018-06-27 – 2018-07-03 (×6): via INTRAVENOUS

## 2018-06-27 NOTE — Progress Notes (Signed)
[place] PHYSICAL MEDICINE & REHABILITATION     PROGRESS NOTE    Subjective/Complaints: Pt up in bed. Says he slept better. "I am ready to go home"  ROS: limited due to language/communication    Objective:  Dg Swallowing Func-speech Pathology  Result Date: 06/26/2018 Objective Swallowing Evaluation: Type of Study: MBS-Modified Barium Swallow Study  Patient Details Name: Mark Montoya MRN: [id number] Date of Birth: September 28, 1941 Today's Date: 06/26/2018 Time: SLP Start Time (ACUTE ONLY): 1200 -SLP Stop Time (ACUTE ONLY): 1215 SLP Time Calculation (min) (ACUTE ONLY): 15 min Past Medical History: Past Medical History: Diagnosis Date . Abnormal echocardiogram   NUCLEAR STRESS TEST, 01/28/2007 - EKG negative for ischemia, no significant ischemia demonstrated . Coronary artery disease 02/03/02  status post CABG by Dr. Andrey SpearmanSteve Hendrickson. LIMA-LAD, seq SVG-D1-D2, SVG-OM, SVG-PDA. Marland Kitchen. Hyperlipidemia  . Hypertension  . Stroke (HCC)  . Structural heart disease   2D ECHO, 01/08/2005 - normal-mild . Syncope   CAROTID DOPPLER, 01/08/2005 - No significant carotid vadcular disease . Type 2 diabetes mellitus (HCC)  Past Surgical History: Past Surgical History: Procedure Laterality Date . CARDIAC CATHETERIZATION  01/22/2002  CABG recommended . CORONARY ARTERY BYPASS GRAFT  02/03/2002  x5; LIMA-LAD, SVG-D1 and D2, SVG-OM1, and SVG-PDA . ORIF TIBIA PLATEAU Right 11/02/2013  Procedure: OPEN REDUCTION INTERNAL FIXATION (ORIF) RIGHT TIBIAL PLATEAU;  Surgeon: Budd PalmerMichael H Handy, MD;  Location: [facility] OR;  Service: Orthopedics;  Laterality: Right; HPI: Patient is a 77 y.o. male with PMH: CVA, HTN, CAD, who [facility] secondary to head injury where he was found to be hypotensive. Head CT revealed multiple areas of ICH including a significant hematoma in left temporal lobe. Repeat CT 7/28 showed Increased size of left intraventricular hemorrhage, now measuring 6.7 x 2.8 x 3.0 cm without hydrocephalus, Increase in diffuse  subarachnoid hemorrhage, Stable remote lacunar infarcts involving the basal ganglia. BSE ordered. CXR No acute findings. Stable subsegmental atelectasis at the left lung  Subjective: very lethargic, followed command to open eyes and sqeeze hand, but no others. Assessment / Plan / Recommendation CHL IP CLINICAL IMPRESSIONS 06/26/2018 Clinical Impression Pt presents with severe oral phase and mild sensorimotor pharyngeal phase dysphagia. Pt's oral phase is c/b increased bolus cohesion and overall organization of bolus. Pt with no active chewing on trials of cracker with applesauce and swallowed cracker whole. Pt's pharyngeal phase is c/b delayed swallow initiation to pyriform sinuses and intermittently decreased epiglottic closure with thin liquids. Pt with consistent penetration with thin liquids via tsp or cup sips. When pt self-fed thin liquids via cup, he presented with deep penetration on ~ 50% of trials but ejected out during the swallow. On 3 instances, penetration reached level of vocal cords but was not aspirated. Pt sensed penetration x2. Despite penetration with thin liquids, pt was able to protect airway among all trials. Although pt was able to protect his airway wiht thin liquids, pt continues to be treated for pneumonia and recurrent chest x-rays. Additionally, pt has comorbidity of dementia and severe dental decay, along with acute severe cognitive deficits from recent fall. Therefore recommend dysphagia 1 diet with nectar thick liquids via cup, oral care prior to and after PO intake, medicine crushed in applesauce with water protocol initiated. Recommend SLP observing pt with full meal and thin liquids to observe for any fatigue/increase in s/s of penetration.  SLP Visit Diagnosis Dysphagia, oropharyngeal phase (R13.12) Attention and concentration deficit following -- Frontal lobe and executive function deficit following --  Impact on safety and function Mild aspiration risk   CHL IP TREATMENT  RECOMMENDATION 06/26/2018 Treatment Recommendations Therapy as outlined in treatment plan below   Prognosis 06/15/2018 Prognosis for Safe Diet Advancement Good Barriers to Reach Goals Cognitive deficits Barriers/Prognosis Comment -- CHL IP DIET RECOMMENDATION 06/26/2018 SLP Diet Recommendations Dysphagia 1 (Puree) solids;Nectar thick liquid Liquid Administration via Cup Medication Administration Crushed with puree Compensations Minimize environmental distractions;Slow rate;Small sips/bites Postural Changes Remain semi-upright after after feeds/meals (Comment);Seated upright at 90 degrees   CHL IP OTHER RECOMMENDATIONS 06/26/2018 Recommended Consults -- Oral Care Recommendations Oral care BID;Oral care before and after PO Other Recommendations Order thickener from pharmacy   CHL IP FOLLOW UP RECOMMENDATIONS 06/15/2018 Follow up Recommendations Skilled Nursing facility   [facility] IP FREQUENCY AND DURATION 06/15/2018 Speech Therapy Frequency (ACUTE ONLY) min 2x/week Treatment Duration 2 weeks      CHL IP ORAL PHASE 06/26/2018 Oral Phase Impaired Oral - Pudding Teaspoon -- Oral - Pudding Cup -- Oral - Honey Teaspoon -- Oral - Honey Cup NT Oral - Nectar Teaspoon Delayed oral transit Oral - Nectar Cup Delayed oral transit Oral - Nectar Straw -- Oral - Thin Teaspoon Reduced posterior propulsion;Holding of bolus;Delayed oral transit Oral - Thin Cup Reduced posterior propulsion;Holding of bolus;Delayed oral transit Oral - Thin Straw -- Oral - Puree Delayed oral transit Oral - Mech Soft Weak lingual manipulation;Reduced posterior propulsion;Holding of bolus;Delayed oral transit Oral - Regular -- Oral - Multi-Consistency -- Oral - Pill -- Oral Phase - Comment --  CHL IP PHARYNGEAL PHASE 06/26/2018 Pharyngeal Phase Impaired Pharyngeal- Pudding Teaspoon -- Pharyngeal -- Pharyngeal- Pudding Cup -- Pharyngeal -- Pharyngeal- Honey Teaspoon -- Pharyngeal -- Pharyngeal- Honey Cup NT Pharyngeal -- Pharyngeal- Nectar Teaspoon Delayed swallow  initiation-vallecula;Delayed swallow initiation-pyriform sinuses Pharyngeal -- Pharyngeal- Nectar Cup Delayed swallow initiation-vallecula;Delayed swallow initiation-pyriform sinuses;Other (Comment) Pharyngeal -- Pharyngeal- Nectar Straw -- Pharyngeal -- Pharyngeal- Thin Teaspoon Delayed swallow initiation-pyriform sinuses;Other (Comment);Penetration/Aspiration during swallow Pharyngeal Material enters airway, remains ABOVE vocal cords then ejected out Pharyngeal- Thin Cup Delayed swallow initiation-pyriform sinuses;Reduced epiglottic inversion;Reduced airway/laryngeal closure;Penetration/Aspiration during swallow Pharyngeal Material enters airway, remains ABOVE vocal cords then ejected out;Material enters airway, CONTACTS cords and then ejected out Pharyngeal- Thin Straw -- Pharyngeal -- Pharyngeal- Puree Pharyngeal residue - valleculae Pharyngeal -- Pharyngeal- Mechanical Soft Pharyngeal residue - valleculae Pharyngeal -- Pharyngeal- Regular -- Pharyngeal -- Pharyngeal- Multi-consistency -- Pharyngeal -- Pharyngeal- Pill -- Pharyngeal -- Pharyngeal Comment --  CHL IP CERVICAL ESOPHAGEAL PHASE 06/26/2018 Cervical Esophageal Phase WFL Pudding Teaspoon -- Pudding Cup -- Honey Teaspoon -- Honey Cup -- Nectar Teaspoon -- Nectar Cup -- Nectar Straw -- Thin Teaspoon -- Thin Cup -- Thin Straw -- Puree -- Mechanical Soft -- Regular -- Multi-consistency -- Pill -- Cervical Esophageal Comment -- Happi Overton 06/26/2018, 1:02 PM              Recent Labs    06/26/18 0600 06/27/18 0548  WBC 8.4 5.3  HGB 9.0* 9.5*  HCT 28.9* 30.3*  PLT 151 154   Recent Labs    06/26/18 0600 06/27/18 0548  NA 146* 145  K 3.6 3.8  CL 108 108  GLUCOSE 126* 164*  BUN 45* 34*  CREATININE 1.98* 1.91*  CALCIUM 8.5* 8.6*   CBG (last 3)  Recent Labs    06/26/18 1651 06/26/18 2143 06/27/18 0624  GLUCAP 158* 189* 148*    Wt Readings from Last 3 Encounters:  06/25/18 80 kg  06/13/18 78.5 kg  05/19/17 79.4 kg  Intake/Output Summary (Last 24 hours) at 06/27/2018 0940 Last data filed at 06/27/2018 1610 Gross per 24 hour  Intake 240 ml  Output -  Net 240 ml    Vital Signs: Blood pressure (!) 175/65, pulse (!) 52, temperature 98.1 F (36.7 C), temperature source Oral, resp. rate 17, height 5\' 7"  (1.702 m), weight 80 kg, SpO2 93 %. Physical Exam:  Constitutional: No distress . Vital signs reviewed. HEENT: EOMI, oral membranes moist, poor dentition Neck: supple, bruising Cardiovascular: RRR without murmur. No JVD    Respiratory: CTA Bilaterally without wheezes or rales. Normal effort    GI: BS +, non-tender, non-distended   Musculoskeletal:  No edema or tenderness in extremities  Neurological: He is more alert.  Expressive aphasia.  Following commands more consistently. Language clearer and more fluid today  right inattention still present  RLE hyperrefexia  Skin: Scalp wound with scab.  Psychiatric: pleasant and cooperative    Assessment/Plan: 1. Functional deficits secondary to TBI which require 3+ hours per day of interdisciplinary therapy in a comprehensive inpatient rehab setting. Physiatrist is providing close team supervision and 24 hour management of active medical problems listed below. Physiatrist and rehab team continue to assess barriers to discharge/monitor patient progress toward functional and medical goals.  Function:  Bathing Bathing position   Position: Wheelchair/chair at sink  Bathing parts Body parts bathed by patient: Right upper leg, Left upper leg, Front perineal area, Right arm, Left arm, Chest, Abdomen, Right lower leg, Left lower leg Body parts bathed by helper: Buttocks  Bathing assist Assist Level: Touching or steadying assistance(Pt > 75%)      Upper Body Dressing/Undressing Upper body dressing   What is the patient wearing?: Pull over shirt/dress     Pull over shirt/dress - Perfomed by patient: Thread/unthread right sleeve, Thread/unthread left  sleeve, Put head through opening Pull over shirt/dress - Perfomed by helper: Pull shirt over trunk        Upper body assist Assist Level: Touching or steadying assistance(Pt > 75%)      Lower Body Dressing/Undressing Lower body dressing   What is the patient wearing?: Pants     Pants- Performed by patient: Thread/unthread right pants leg Pants- Performed by helper: Thread/unthread left pants leg, Pull pants up/down   Non-skid slipper socks- Performed by helper: Don/doff right sock, Don/doff left sock               TED Hose - Performed by helper: Don/doff right TED hose, Don/doff left TED hose  Lower body assist Assist for lower body dressing: Touching or steadying assistance (Pt > 75%)      Toileting Toileting Toileting activity did not occur: No continent bowel/bladder event Toileting steps completed by patient: Adjust clothing prior to toileting Toileting steps completed by helper: Performs perineal hygiene, Adjust clothing after toileting    Toileting assist Assist level: Touching or steadying assistance (Pt.75%)   Transfers Chair/bed transfer Chair/bed transfer activity did not occur: Safety/medical concerns Chair/bed transfer method: Stand pivot Chair/bed transfer assist level: Moderate assist (Pt 50 - 74%/lift or lower) Chair/bed transfer assistive device: Patent attorney     Max distance: 15(limited due to briefs starting to slide down ) Assist level: Touching or steadying assistance (Pt > 75%)   Wheelchair   Type: Manual Max wheelchair distance: 100' Assist Level: Moderate assistance (Pt 50 - 74%)  Cognition Comprehension Comprehension assist level: Understands basic 25 - 49% of the time/ requires cueing 50 - 75% of  the time  Expression Expression assist level: Expresses basic 25 - 49% of the time/requires cueing 50 - 75% of the time. Uses single words/gestures.  Social Interaction Social Interaction assist level: Interacts appropriately  25 - 49% of time - Needs frequent redirection.  Problem Solving Problem solving assist level: Solves basic 25 - 49% of the time - needs direction more than half the time to initiate, plan or complete simple activities  Memory Memory assist level: Recognizes or recalls 25 - 49% of the time/requires cueing 50 - 75% of the time   Medical Problem List and Plan: 1.  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  DVT Prophylaxis/Anticoagulation: Subcutaneous heparin initiated 06/17/2018.  Monitor for any bleeding episodes 3. Pain Management: Tylenol as needed 4. Mood: Aricept 10 mg nightly, Xanax 0.125 mg nightly, Zoloft 100 mg daily, Seroquel 12.5 mg twice daily 5. Neuropsych: This patient is not capable of making decisions on his own behalf. 6. Skin/Wound Care: Routine skin checks  -continue local care as needed to scalp     7. Fluids/Electrolytes/Nutrition: encourage PO     \ -BUN improving (34/1.9 8/9)  -continue IVF but change to HS   -liquids advanced 8.  Dysphasia.  Dysphasia #1 honey thick liquid---advanced to nectars per MBS 8/8    9.  Hypertension.  Clonidine 0.3 mg TID, hydralazine 50 mg every 8 hours, Norvasc 10 mg daily.   -good control 8/8 10.  Diabetes mellitus.  Hemoglobin A1c 7.0.  Glucotrol 5 mg daily, Glucophage 1000 mg twice daily.    -glucotrol 5mg  bid now   -cover with SSI 11.  CKD stage III.  Creatinine baseline 2.07. (34/1.9 8.9)  -probably close to baseline 12.  BPH.  Flomax 0.8 mg daily.    13.  CAD with CABG 2003.  Aspirin currently on hold due to left temporal lobe hemorrhage.  No chest pain or shortness of breath 13. ABLA:     -stool OB negative, no signs of blood loss   -hgb 9.5 8/9   -continue to monitor 14. Fever: afebrile last 24   -clinically improving  -WBC's initially 23.5k 8/6---down to 5.3 on 8/9  -exam is not impressive but most recent cxr suggest LLL pneumonia   -likely aspiration  event   -seems to be responding to zosyn---continue for 1 week   -ua neg, ucx neg, dopplers negative   -orthopanogram with lucencies, no signs of osteo         LOS (Days) 9 A FACE TO FACE EVALUATION WAS PERFORMED  Ranelle Oyster, MD 06/27/2018 9:40 AM

## 2018-06-27 NOTE — Progress Notes (Addendum)
Occupational Therapy Session Note  Patient Details  Name: Mark Montoya MRN: [id number] Date of Birth: 08/01/1941  Today's Date: 06/27/2018  Session 1 OT Individual Time: 0803-0900 OT Individual Time Calculation (min): 57 min   Session 2 OT Individual Time: 1335-1400 OT Individual Time Calculation (min): 25 min   Short Term Goals: Week 2:  OT Short Term Goal 1 (Week 2): Pt will complete 1/3 toileting steps with touching assist OT Short Term Goal 2 (Week 2): Pt will complete LB dressing with no more than min A OT Short Term Goal 3 (Week 2): Pt will complete toilet transfer with consistent min A  Skilled Therapeutic Interventions/Progress Updates:  Session 1   Pt greeted semi-reclined in bed, improved language today and was able to have good conversation with MD and OT. Pt able to answer questions appropriately at least 50% of the time. Pt completed stand-pivot transfers bed>wc>rasied toilet seat with min A and verbal cues for hand placement and technique. Assistance for clothing management, then pt able to successfully void bowel and bladder. Worked on hip hike for self toileting with pt able to demonstrate understanding, but OT provided assistance for thoroughness. Bathing/dressing completed wc at the sink with focus on initiation and sequencing of tasks. Pt able to thread B LE into pants without cues, then stood with CGA and pulled pants up with CGA for balance. Standing toothbrushing task completed with pt able to sequence 75% of task without cues. CGA for balance throughout standing task. Pt able to don shoes today with min A and was also able to tie them with increased time and effort. Pt left seated in wc at end of session with alarm belt on and needs met.   Session 2 Pt came to sitting EOB with increased time and verbal cues to initiate. Stand-pivot to wc on R side with min A. Treatment session focused on self-feeding. Pt able to self-feed with min verbal cues for appropriate sized bites  and cues for visual scanning to locate items across tray. Pt also needed occasional cues to swallow and clear oral residue. Pt left seated in wc at end of session with safety alarm belt on and needs met.   Therapy Documentation Precautions:  Precautions Precautions: Fall Restrictions Weight Bearing Restrictions: No Pain:   none denies pain See Function Navigator for Current Functional Status.  Therapy/Group: Individual Therapy  Valma Cava 06/27/2018, 3:23 PM

## 2018-06-27 NOTE — Plan of Care (Signed)
Pt is confused and forgets lots of information the moment the conversation is over.

## 2018-06-27 NOTE — Progress Notes (Signed)
Physical Therapy Session Note  Patient Details  Name: Mark Montoya MRN: [id number] Date of Birth: 1941/07/26  Today's Date: 06/27/2018 PT Individual Time: 0900-0955 PT Individual Time Calculation (min): 55 min   Short Term Goals: Week 2:  PT Short Term Goal 1 (Week 2): pt will perform gait x 100' with min A in controlled environment PT Short Term Goal 2 (Week 2): pt will consistently perform functional transfers with min A  Skilled Therapeutic Interventions/Progress Updates:    pt performs gait with RW with min A in controlled environment, max cues for staying close to RW and for RW safety with obstacle negotiation and tight spaces.  Pt requires mod A with RW for turns to sit due to decreased safety awareness.  Step ups to 4'' step for LE strengthening x 10 with max cues for attention to task.  nustep x 6 minutes level 5 for increased activity tolerance and strength.  Floor transfer with max cues for problem solving, mod A to get down onto floor, max A to get up into standing and into chair. Pt limited by decreased problem solving and attention to task but is improving strength and activity tolerance.  Therapy Documentation Precautions:  Precautions Precautions: Fall Restrictions Weight Bearing Restrictions: No Pain:  no c/o pain  Therapy/Group: Individual Therapy  Matthew Pais 06/27/2018, 10:00 AM

## 2018-06-27 NOTE — Progress Notes (Signed)
Speech Language Pathology Daily Session Note  Patient Details  Name: Mark Montoya MRN: [id number] Date of Birth: 04-Jan-1941  Today's Date: 06/27/2018 SLP Individual Time: 1100-1157 SLP Individual Time Calculation (min): 57 min  Short Term Goals: Week 2: SLP Short Term Goal 1 (Week 2): Patient will consume current diet with minimal overt s/s of aspiration with Min A verbal cues for use of swallowing strategies.  SLP Short Term Goal 2 (Week 2): Patient will demonstrate sustained attention to functional tasks for 10 minutes with Mod A verbal cues for redirection.  SLP Short Term Goal 3 (Week 2): Patient will self-monitor and correct verbal erros at the word level with Max A multimodal cues.  SLP Short Term Goal 4 (Week 2): Patient will attend to right field of enviornment/body with functional and familiar tasks with Mod A verbal cues.  SLP Short Term Goal 5 (Week 2): Patient will follow basic commands with functional and familiar tasks with Mod A multimodal cues.  SLP Short Term Goal 6 (Week 2): Patient will answer basic yes/no questions with Mod A verbal cues and 75% accuracy.   Skilled Therapeutic Interventions: Skilled treatment session focused on dysphagia and speech goals. Upon arrival, patient was supine in bed but agreeable to transfer to the wheelchair for sips of thin liquids. Patient transferred to the wheelchair with extra time and overall supervision. SLP facilitated session by providing Min A verbal and visual cues for problem solving during oral care via the suction toothbrush. Patient consumed trials of thin liquids via cup without overt s/s of aspiration with Min A verbal cues for use of small sips. Patient declined lunch and snack of pureed textures. 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 Patient continues to demonstrate increased  awareness of verbal errors but requires total A to self-correct. Patient left upright in wheelchair with alarm on and all needs within reach. Continue with current plan of care.       Function:  Eating Eating   Modified Consistency Diet: No(with trials from SLP) Eating Assist Level: Supervision or verbal cues   Eating Set Up Assist For: Opening containers       Cognition Comprehension Comprehension assist level: Understands basic 50 - 74% of the time/ requires cueing 25 - 49% of the time  Expression   Expression assist level: Expresses basic 50 - 74% of the time/requires cueing 25 - 49% of the time. Needs to repeat parts of sentences.  Social Interaction Social Interaction assist level: Interacts appropriately 50 - 74% of the time - May be physically or verbally inappropriate.  Problem Solving Problem solving assist level: Solves basic 50 - 74% of the time/requires cueing 25 - 49% of the time  Memory Memory assist level: Recognizes or recalls 25 - 49% of the time/requires cueing 50 - 75% of the time    Pain No/Denies Pain   Therapy/Group: Individual Therapy  Mark Montoya 06/27/2018, 12:02 PM

## 2018-06-28 ENCOUNTER — Inpatient Hospital Stay (HOSPITAL_COMMUNITY): Payer: Medicare Other | Admitting: Occupational Therapy

## 2018-06-28 ENCOUNTER — Inpatient Hospital Stay (HOSPITAL_COMMUNITY): Payer: Medicare Other | Admitting: Physical Therapy

## 2018-06-28 LAB — OCCULT BLOOD X 1 CARD TO LAB, STOOL: FECAL OCCULT BLD: NEGATIVE

## 2018-06-28 LAB — GLUCOSE, CAPILLARY
GLUCOSE-CAPILLARY: 112 mg/dL — AB (ref 70–99)
GLUCOSE-CAPILLARY: 129 mg/dL — AB (ref 70–99)
GLUCOSE-CAPILLARY: 145 mg/dL — AB (ref 70–99)
Glucose-Capillary: 149 mg/dL — ABNORMAL HIGH (ref 70–99)

## 2018-06-28 NOTE — Progress Notes (Signed)
   Subjective/Complaints: Feels well.  Eager to go home.  He has no complaints.  He denies pain.  Objective:  Vital Signs: Blood pressure (!) 172/72, pulse (!) 56, temperature 98.1 F (36.7 C), temperature source Oral, resp. rate 16, height 5\' 7"  (1.702 m), weight 80 kg, SpO2 94 %. Physical Exam:  Elderly male in no acute distress.  HEENT exam atraumatic, normocephalic, extra ocular muscles are intact.  Has very poor dentition.  Neck is supple.  Chest clear to auscultation.  Cardiac exam S1-S2 regular.  Abdominal exam thin, active bowel sounds, soft Extremities without significant edema.  Assessment/Plan: 1. Functional deficits secondary to TBI   Medical Problem List and Plan: 1.  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 2.  DVT Prophylaxis/Anticoagulation: Subcutaneous heparin.   3. Pain Management: Patient denies pain. 4. Mood: He is on several psychotropic drugs including Seroquel, Xanax, Zoloft, Aricept.  We will continue to monitor. 5. Neuropsych: This patient is not capable of making decisions on his own behalf. 6. Skin/Wound Care: Routine skin checks  -continue local care as needed to scalp     7. Fluids/Electrolytes/Nutrition: Basic Metabolic Panel:    Component Value Date/Time   NA 145 06/27/2018 0548   K 3.8 06/27/2018 0548   CL 108 06/27/2018 0548   CO2 26 06/27/2018 0548   BUN 34 (H) 06/27/2018 0548   CREATININE 1.91 (H) 06/27/2018 0548   GLUCOSE 164 (H) 06/27/2018 0548   CALCIUM 8.6 (L) 06/27/2018 0548    8.  Dysphasia.  Dysphasia #1 honey thick liquid---advanced to nectars per MBS 8/8    9.  Hypertension.    105/84-172/72 Variable, will follow today 10.  Diabetes mellitus.   CBG (last 3)  Recent Labs    06/27/18 1648 06/27/18 2110 06/28/18 0705  GLUCAP 127* 194* 112*   ine baseline 2.07. (34/1.9 8.9)  -probably close to baseline 12.  BPH.  Flomax 0.8 mg daily.    13.  CAD with  CABG 2003.  Aspirin currently on hold due to left temporal lobe hemorrhage.  No chest pain or shortness of breath 13. ABLA:     -stool OB negative, no signs of blood loss   -hgb 9.5 8/9   -continue to monitor 14. Fever: Patient is afebrile.  Clinically no signs or symptoms of pneumonia.  We will continue antibiotics.         LOS (Days) 10 A FACE TO FACE EVALUATION WAS PERFORMED  Lindley MagnusBruce H Swords, MD 06/28/2018 10:27 AM

## 2018-06-28 NOTE — Progress Notes (Signed)
Occupational Therapy Session Note  Patient Details  Name: Mark Montoya MRN: [id number] Date of Birth: 1941-08-09  Today's Date: 06/28/2018 OT Individual Time: 1015-1045 OT Individual Time Calculation (min): 30 min   Short Term Goals: Week 2:  OT Short Term Goal 1 (Week 2): Pt will complete 1/3 toileting steps with touching assist OT Short Term Goal 2 (Week 2): Pt will complete LB dressing with no more than min A OT Short Term Goal 3 (Week 2): Pt will complete toilet transfer with consistent min A  Skilled Therapeutic Interventions/Progress Updates:    Pt greeted semi-reclined in bed and agreeable to OT. Pt slow to get out of bed today and needed assistance to initiate. Min A and increased time to get to sitting EOB. Min A stand-pivot to wc with verbal cues. Pt noted to be incontinent of urine and soaked through brief. Worked on sit<>stand and standing balance while pt washed peri-area and buttocks- CGA for balance. New brief donned, then pt able to sequence threading pant legs using figure 4 position and min verbal cues. Pt left seated in wc at end of session with alarm belt on and needs met.   Therapy Documentation Precautions:  Precautions Precautions: Fall Restrictions Weight Bearing Restrictions: No Pain: None/denies pain See Function Navigator for Current Functional Status.  Therapy/Group: Individual Therapy  Valma Cava 06/28/2018, 10:46 AM

## 2018-06-29 ENCOUNTER — Inpatient Hospital Stay (HOSPITAL_COMMUNITY): Payer: Medicare Other | Admitting: Occupational Therapy

## 2018-06-29 DIAGNOSIS — J181 Lobar pneumonia, unspecified organism: Secondary | ICD-10-CM

## 2018-06-29 LAB — GLUCOSE, CAPILLARY
GLUCOSE-CAPILLARY: 128 mg/dL — AB (ref 70–99)
GLUCOSE-CAPILLARY: 95 mg/dL (ref 70–99)
Glucose-Capillary: 86 mg/dL (ref 70–99)
Glucose-Capillary: 150 mg/dL — ABNORMAL HIGH (ref 70–99)
Glucose-Capillary: 78 mg/dL (ref 70–99)

## 2018-06-29 MED ORDER — AMOXICILLIN-POT CLAVULANATE 875-125 MG PO TABS
1.0000 | ORAL_TABLET | Freq: Two times a day (BID) | ORAL | Status: AC
  Administered 2018-06-29 – 2018-06-30 (×4): 1 via ORAL
  Filled 2018-06-29 (×4): qty 1

## 2018-06-29 MED ORDER — METOPROLOL TARTRATE 12.5 MG HALF TABLET
12.5000 mg | ORAL_TABLET | Freq: Two times a day (BID) | ORAL | Status: DC
  Administered 2018-06-29 – 2018-07-10 (×23): 12.5 mg via ORAL
  Filled 2018-06-29 (×23): qty 1

## 2018-06-29 NOTE — Progress Notes (Addendum)
   Subjective/Complaints: Feels well.  No complaints..  Objective:  Vital Signs: Blood pressure (!) 180/70, pulse (!) 51, temperature 98.3 F (36.8 C), temperature source Oral, resp. rate 17, height 5\' 7"  (1.702 m), weight 80 kg, SpO2 95 %. Physical Exam: (no change in exam from yesterday) Elderly male in no acute distress.  HEENT exam atraumatic, normocephalic, extra ocular muscles are intact.  Has very poor dentition.  Neck is supple.  Chest clear to auscultation.  Cardiac exam S1-S2 regular.  Abdominal exam thin, active bowel sounds, soft Extremities without significant edema.  Assessment/Plan: 1. Functional deficits secondary to TBI   Medical Problem List and Plan: 1.  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 2.  DVT Prophylaxis/Anticoagulation: Subcutaneous heparin.   3. Pain Management: Patient denies pain. 4. Mood: He is on several psychotropic drugs including Seroquel, Xanax, Zoloft, Aricept.  We will continue to monitor. 5. Neuropsych: This patient is not capable of making decisions on his own behalf. 6. Skin/Wound Care: Routine skin checks  -continue local care as needed to scalp     7. Fluids/Electrolytes/Nutrition: Basic Metabolic Panel:    Component Value Date/Time   NA 145 06/27/2018 0548   K 3.8 06/27/2018 0548   CL 108 06/27/2018 0548   CO2 26 06/27/2018 0548   BUN 34 (H) 06/27/2018 0548   CREATININE 1.91 (H) 06/27/2018 0548   GLUCOSE 164 (H) 06/27/2018 0548   CALCIUM 8.6 (L) 06/27/2018 0548    8.  Dysphasia.  Dysphasia #1 honey thick liquid---advanced to nectars per MBS 8/8    9.  Hypertension.   130/55-180/70  will adjust meds today Options are limited given bradycardia and renal insufficiency. He is on hydralazine and amlodipine.  Will add low dose lopressor and watch heart rate 10.  Diabetes mellitus.   CBG (last 3)  Recent Labs    06/28/18 1646 06/28/18 2129 06/29/18 0616   GLUCAP 129* 145* 86   Chronic kidney dzs: Lab Results  Component Value Date   CREATININE 1.91 (H) 06/27/2018   12.  BPH.  Flomax 0.8 mg daily.    13.  CAD with CABG 2003.  Aspirin currently on hold due to left temporal lobe hemorrhage.  No chest pain or shortness of breath 13. ABLA:     -stool OB negative, no signs of blood loss   -hgb 9.5 8/9   -continue to monitor 14. Fever: Patient is afebrile.  Clinically no signs or symptoms of pneumonia.  We will continue antibiotics- change tol augmentin.         LOS (Days) 11 A FACE TO FACE EVALUATION WAS PERFORMED  Lindley MagnusBruce H Swords, MD 06/29/2018 8:41 AM

## 2018-06-29 NOTE — Progress Notes (Signed)
Occupational Therapy Session Note  Patient Details  Name: Mark Montoya MRN: [id number] Date of Birth: 1941/05/13  Today's Date: 06/29/2018 OT Individual Time: [id number] OT Individual Time Calculation (min): 32 min   Skilled Therapeutic Interventions/Progress Updates:    Pt greeted supine in bed without s/s pain. Stand pivot<w/c completed with Mod A and cues for redirection to task due to perseverative behaviors. Worked on standing balance, endurance, and ADL retraining during pericare completion and LB dressing at sink. Pt incontinent of urine in brief. He required increased time and cues for safety and sequencing during these tasks. Hairbrushing completed with assist for Lt side of head due to staple site. Pt left in w/c with all needs within reach and safety belt fastened.   Therapy Documentation Precautions:  Precautions Precautions: Fall Restrictions Weight Bearing Restrictions: No Vital Signs: Therapy Vitals Temp: 98.2 F (36.8 C) Temp Source: Oral Pulse Rate: (!) 56 Resp: (!) 21 BP: (!) 109/51 Patient Position (if appropriate): Lying Oxygen Therapy SpO2: 96 % O2 Device: Room Air ADL:       See Function Navigator for Current Functional Status.   Therapy/Group: Individual Therapy  Kuba Shepherd A Junius Faucett 06/29/2018, 4:28 PM

## 2018-06-30 ENCOUNTER — Ambulatory Visit (HOSPITAL_COMMUNITY): Payer: Medicare Other | Admitting: Speech Pathology

## 2018-06-30 ENCOUNTER — Inpatient Hospital Stay (HOSPITAL_COMMUNITY): Payer: Medicare Other

## 2018-06-30 ENCOUNTER — Inpatient Hospital Stay (HOSPITAL_COMMUNITY): Payer: Medicare Other | Admitting: Occupational Therapy

## 2018-06-30 DIAGNOSIS — R7989 Other specified abnormal findings of blood chemistry: Secondary | ICD-10-CM

## 2018-06-30 DIAGNOSIS — J690 Pneumonitis due to inhalation of food and vomit: Secondary | ICD-10-CM

## 2018-06-30 DIAGNOSIS — I611 Nontraumatic intracerebral hemorrhage in hemisphere, cortical: Secondary | ICD-10-CM

## 2018-06-30 LAB — BASIC METABOLIC PANEL
Anion gap: 10 (ref 5–15)
BUN: 23 mg/dL (ref 8–23)
CALCIUM: 8.6 mg/dL — AB (ref 8.9–10.3)
CO2: 23 mmol/L (ref 22–32)
CREATININE: 1.59 mg/dL — AB (ref 0.61–1.24)
Chloride: 106 mmol/L (ref 98–111)
GFR calc Af Amer: 46 mL/min — ABNORMAL LOW (ref >60)
GFR, EST NON AFRICAN AMERICAN: 40 mL/min — AB (ref >60)
GLUCOSE: 96 mg/dL (ref 70–99)
Potassium: 4.3 mmol/L (ref 3.5–5.1)
Sodium: 139 mmol/L (ref 135–145)

## 2018-06-30 LAB — CBC
HCT: 29.3 % — ABNORMAL LOW (ref 39.0–52.0)
Hemoglobin: 9.2 g/dL — ABNORMAL LOW (ref 13.0–17.0)
MCH: 29.9 pg (ref 26.0–34.0)
MCHC: 31.4 g/dL (ref 30.0–36.0)
MCV: 95.1 fL (ref 78.0–100.0)
PLATELETS: 159 10*3/uL (ref 150–400)
RBC: 3.08 MIL/uL — ABNORMAL LOW (ref 4.22–5.81)
RDW: 13.5 % (ref 11.5–15.5)
WBC: 7.5 10*3/uL (ref 4.0–10.5)

## 2018-06-30 LAB — GLUCOSE, CAPILLARY
GLUCOSE-CAPILLARY: 167 mg/dL — AB (ref 70–99)
Glucose-Capillary: 86 mg/dL (ref 70–99)
Glucose-Capillary: 132 mg/dL — ABNORMAL HIGH (ref 70–99)
Glucose-Capillary: 76 mg/dL (ref 70–99)

## 2018-06-30 NOTE — Progress Notes (Signed)
Physical Therapy Session Note  Patient Details  Name: Mark Montoya MRN: [id number] Date of Birth: 1941/04/03  Today's Date: 06/30/2018 PT Individual Time: 1600-1655 PT Individual Time Calculation (min): 55 min   Short Term Goals: Week 2:  PT Short Term Goal 1 (Week 2): pt will perform gait x 100' with min A in controlled environment PT Short Term Goal 2 (Week 2): pt will consistently perform functional transfers with min A  Skilled Therapeutic Interventions/Progress Updates:    Pt supine in bed upon PT arrival, agreeable to therapy tx and denies pain. Pt transferred to EOB with min assist and increased time, pt disoriented with therapist attempting to reorient. Pt seated EOB donned pants with increased time to participate/perform sit<>stand. Pt performs sit<>stand with mod assist and max encouragement, pulls up pants in standing. Pt ambulates to w/c with RW and min assist, x 10 ft. Pt transported to the gym. Pt ambulated 2 x 80 ft this session with RW and min assist, verbal cues for device management and obstacle avoidance. Pt worked on dynamic balance while ambulating without AD x 80 ft, verbal cues for increased step length. Pt worked on dynamic balance while ambulating backwards at the rail x 20 ft with min assist. Pt worked on dynamic balance to perform toe taps on 4 inch step with RW for UE support, max verbal cues for techniques. Pt used nustep x 6 minutes on workload 5 for strengthening and endurance. Pt transported back to room at end of session and left seated in w/c with needs in reach and chair alarm set.   Therapy Documentation Precautions:  Precautions Precautions: Fall Restrictions Weight Bearing Restrictions: No   See Function Navigator for Current Functional Status.   Therapy/Group: Individual Therapy  Cresenciano GenreEmily van Schagen, PT, DPT 06/30/2018, 7:55 AM

## 2018-06-30 NOTE — Progress Notes (Deleted)
Patient reporting headache and weakness in AM. Rested in bed, drank 6 cups of water. Ate late breakfast then lunch. Felt better prior to 1300 therapy. Worked with PT but some transient dizziness noted and BP dropped to Sys 98 after standing. Patient wearing TED hose and ABD binder at the time. Patient stated he felt well and had better mobility of L arm during therapy session. BP remain in 100-110 range. PA made aware. Patient resting in bed in no acute distress. Will continue to push fluids and monitor.  

## 2018-06-30 NOTE — Progress Notes (Signed)
Speech Language Pathology Daily Session Note  Patient Details  Name: Mark Montoya MRN: [id number] Date of Birth: Oct 12, 1941  Today's Date: 06/30/2018 SLP Individual Time: 0830-0930 SLP Individual Time Calculation (min): 60 min  Short Term Goals: Week 2: SLP Short Term Goal 1 (Week 2): Patient will consume current diet with minimal overt s/s of aspiration with Min A verbal cues for use of swallowing strategies.  SLP Short Term Goal 2 (Week 2): Patient will demonstrate sustained attention to functional tasks for 10 minutes with Mod A verbal cues for redirection.  SLP Short Term Goal 3 (Week 2): Patient will self-monitor and correct verbal erros at the word level with Max A multimodal cues.  SLP Short Term Goal 4 (Week 2): Patient will attend to right field of enviornment/body with functional and familiar tasks with Mod A verbal cues.  SLP Short Term Goal 5 (Week 2): Patient will follow basic commands with functional and familiar tasks with Mod A multimodal cues.  SLP Short Term Goal 6 (Week 2): Patient will answer basic yes/no questions with Mod A verbal cues and 75% accuracy.   Skilled Therapeutic Interventions: Skilled treatment session focused on dysphagia and communication goals.  SLP facilitated session by providing skilled observation of pt consuming dysphagia 1 breakfast tray with thin liquids via cup. Pt required Max A cues to continue consuming beyond initial 5 sips. Pt states that he doesn't want to drink too much d/t "leaky bladder." Pt with improved oral clearing of puree boluses and 1 delayed throat clear throughout consumption of 16 oz thin liquids via cup. Recommend pt be upgraded to thin liquids via CUP d/t decreased overt s/s of aspiration over weekend with trials and during consumption of meal this session. Education provided to nurse and posted in room. Pt must have oral care before and after PO intake d/t poor dental health. SLP further facilitated session by providing supervision  cues for phrase level "conversation" about meal. Pt with increased intelligible sentences (~ 75%) when discussing meal, bladder, types of liquids, what he likes to eat at home etc. Pt with improved intelligibility this session over previous sessions with this Clinical research associatewriter. Pt left upright in bed, bed alarm on and all needs within reach. Continue per current plan of care.      Function:  Eating Eating   Modified Consistency Diet: Yes(Puree diet with thin liquids) Eating Assist Level: Supervision or verbal cues   Eating Set Up Assist For: Opening containers   Helper Brings Food to Mouth: Every scoop   Cognition Comprehension Comprehension assist level: Understands basic 50 - 74% of the time/ requires cueing 25 - 49% of the time  Expression   Expression assist level: Expresses basic 50 - 74% of the time/requires cueing 25 - 49% of the time. Needs to repeat parts of sentences.;Expresses basic 75 - 89% of the time/requires cueing 10 - 24% of the time. Needs helper to occlude trach/needs to repeat words.  Social Interaction Social Interaction assist level: Interacts appropriately 25 - 49% of time - Needs frequent redirection.  Problem Solving Problem solving assist level: Solves basic 25 - 49% of the time - needs direction more than half the time to initiate, plan or complete simple activities  Memory Memory assist level: Recognizes or recalls 25 - 49% of the time/requires cueing 50 - 75% of the time    Pain    Therapy/Group: Individual Therapy  Max Nuno 06/30/2018, 9:22 AM

## 2018-06-30 NOTE — Progress Notes (Signed)
[facility]     PROGRESS NOTE    Subjective/Complaints: Up in bed. No new issues.   ROS: Patient denies fever, rash, sore throat, blurred vision, nausea, vomiting, diarrhea, cough, shortness of breath or chest pain, joint or back pain, headache, or mood change.    Objective:  No results found. Recent Labs    06/30/18 0606  WBC 7.5  HGB 9.2*  HCT 29.3*  PLT 159   Recent Labs    06/30/18 0606  NA 139  K 4.3  CL 106  GLUCOSE 96  BUN 23  CREATININE 1.59*  CALCIUM 8.6*   CBG (last 3)  Recent Labs    06/29/18 2350 06/30/18 0701 06/30/18 1118  GLUCAP 95 86 132*    Wt Readings from Last 3 Encounters:  06/25/18 80 kg  06/13/18 78.5 kg  05/19/17 79.4 kg     Intake/Output Summary (Last 24 hours) at 06/30/2018 1145 Last data filed at 06/30/2018 0800 Gross per 24 hour  Intake 720 ml  Output -  Net 720 ml    Vital Signs: Blood pressure (!) 144/67, pulse (!) 55, temperature 98.4 F (36.9 C), temperature source Oral, resp. rate 17, height 5\' 7"  (1.702 m), weight 80 kg, SpO2 95 %. Physical Exam:  Constitutional: No distress . Vital signs reviewed. HEENT: EOMI, oral membranes moist Neck: supple Cardiovascular: RRR without murmur. No JVD    Respiratory: CTA Bilaterally without wheezes or rales. Normal effort    GI: BS +, non-tender, non-distended   Musculoskeletal:  No edema or tenderness in extremities  Neurological: He is more alert.  Expressive aphasia.  Following commands more consistently. Language clearer and more fluid today  right inattention still present  RLE hyperrefexia  Skin: Scalp wound with scab.  Psychiatric: pleasant and cooperative    Assessment/Plan: 1. Functional deficits secondary to TBI which require 3+ hours per day of interdisciplinary therapy in a comprehensive inpatient rehab setting. Physiatrist is providing close team supervision and 24 hour management of active medical problems listed  below. Physiatrist and rehab team continue to assess barriers to discharge/monitor patient progress toward functional and medical goals.  Function:  Bathing Bathing position   Position: Other (comment)(sitting on BSC over toilet)  Bathing parts Body parts bathed by patient: Right upper leg, Left upper leg, Front perineal area, Right arm, Left arm, Chest, Abdomen, Right lower leg, Left lower leg, Buttocks Body parts bathed by helper: Back  Bathing assist Assist Level: Touching or steadying assistance(Pt > 75%)      Upper Body Dressing/Undressing Upper body dressing   What is the patient wearing?: Pull over shirt/dress     Pull over shirt/dress - Perfomed by patient: Thread/unthread right sleeve, Thread/unthread left sleeve, Put head through opening, Pull shirt over trunk Pull over shirt/dress - Perfomed by helper: Pull shirt over trunk        Upper body assist Assist Level: Touching or steadying assistance(Pt > 75%)      Lower Body Dressing/Undressing Lower body dressing   What is the patient wearing?: Pants     Pants- Performed by patient: Pull pants up/down Pants- Performed by helper: Thread/unthread right pants leg, Thread/unthread left pants leg, Fasten/unfasten pants   Non-skid slipper socks- Performed by helper: Don/doff right sock, Don/doff left sock               TED Hose - Performed by helper: Don/doff right TED hose, Don/doff left TED hose  Lower body assist Assist for lower body  dressing: Touching or steadying assistance (Pt > 75%)      Toileting Toileting Toileting activity did not occur: No continent bowel/bladder event Toileting steps completed by patient: Adjust clothing prior to toileting Toileting steps completed by helper: Performs perineal hygiene, Adjust clothing after toileting    Toileting assist Assist level: Touching or steadying assistance (Pt.75%)   Transfers Chair/bed transfer Chair/bed transfer activity did not occur: Safety/medical  concerns Chair/bed transfer method: Stand pivot Chair/bed transfer assist level: Moderate assist (Pt 50 - 74%/lift or lower) Chair/bed transfer assistive device: Patent attorneyWalker     Locomotion Ambulation     Max distance: 15(limited due to briefs starting to slide down ) Assist level: Touching or steadying assistance (Pt > 75%)   Wheelchair   Type: Manual Max wheelchair distance: 100' Assist Level: Moderate assistance (Pt 50 - 74%)  Cognition Comprehension Comprehension assist level: Understands basic 50 - 74% of the time/ requires cueing 25 - 49% of the time  Expression Expression assist level: Expresses basic 50 - 74% of the time/requires cueing 25 - 49% of the time. Needs to repeat parts of sentences., Expresses basic 75 - 89% of the time/requires cueing 10 - 24% of the time. Needs helper to occlude trach/needs to repeat words.  Social Interaction Social Interaction assist level: Interacts appropriately 25 - 49% of time - Needs frequent redirection.  Problem Solving Problem solving assist level: Solves basic 25 - 49% of the time - needs direction more than half the time to initiate, plan or complete simple activities  Memory Memory assist level: Recognizes or recalls 25 - 49% of the time/requires cueing 50 - 75% of the time   Medical Problem List and Plan: 1.  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  DVT Prophylaxis/Anticoagulation: Subcutaneous heparin initiated 06/17/2018.  Monitor for any bleeding episodes 3. Pain Management: Tylenol as needed 4. Mood: Aricept 10 mg nightly, Xanax 0.125 mg nightly, Zoloft 100 mg daily, Seroquel 12.5 mg twice daily 5. Neuropsych: This patient is not capable of making decisions on his own behalf. 6. Skin/Wound Care: Routine skin checks  -continue local care as needed to scalp     7. Fluids/Electrolytes/Nutrition: encourage PO     \ -BUN improved to 23/1.59 8/12  -now on thins, dc IVF     8.  Dysphasia.  Dysphasia #1 -advanced to thins     9.  Hypertension.  Clonidine 0.3 mg TID, hydralazine 50 mg every 8 hours, Norvasc 10 mg daily.   -good control 8/12 10.  Diabetes mellitus.  Hemoglobin A1c 7.0.  Glucotrol 5 mg daily, Glucophage 1000 mg twice daily.    -glucotrol 5mg  bid now---watch for hypoglycemia  -needs to eat consistently   -cover with SSI 11.  CKD stage III. Cr 1.59  -probably close to baseline 12.  BPH.  Flomax 0.8 mg daily.    13.  CAD with CABG 2003.  Aspirin currently on hold due to left temporal lobe hemorrhage.  No chest pain or shortness of breath 13. ABLA:     -stool OB negative, no signs of blood loss   -hgb 9.5 8/9   -continue to monitor 14. Fever: afebrile last 24   -clinically improving  -afebrile, wbc's normal  -exam is not impressive but most recent cxr suggest LLL pneumonia   -likely aspiration event   -seems to be responding to zosyn---now on augmentin--continue for 7 day course   -ua neg, ucx neg, dopplers negative   -  orthopanogram with lucencies, no signs of osteo         LOS (Days) 12 A FACE TO FACE EVALUATION WAS PERFORMED  Ranelle OysterZachary T Jeryl Wilbourn, MD 06/30/2018 11:45 AM

## 2018-06-30 NOTE — Progress Notes (Signed)
Occupational Therapy Session Note  Patient Details  Name: Mark Montoya F Krotz MRN: [id number] Date of Birth: 04/06/1941  Today's Date: 06/30/2018 OT Individual Time: [id number] OT Individual Time Calculation (min): 75 min    Short Term Goals: Week 2:  OT Short Term Goal 1 (Week 2): Pt will complete 1/3 toileting steps with touching assist OT Short Term Goal 2 (Week 2): Pt will complete LB dressing with no more than min A OT Short Term Goal 3 (Week 2): Pt will complete toilet transfer with consistent min A  Skilled Therapeutic Interventions/Progress Updates:    Pt received in bed and pt was quite disoriented thinking it was 9pm at night and not realizing that he was in the hospital.  Pt was quite verbal but difficult to understand and direct. Pt responded best with gentle tactile direction or short firm verbal instructions.  Pt was able to sit to EOB, stand and stand pivot to wc with min A.  His brief was soaked so pt taken to bathroom. [facility] with min A using bar.  Pt sat on toilet but was unable to urinate more.  He completed bath from sitting and standing with verbal cues fluctuating from min to max A. At times, he demonstrated good attention and initiation and other times could not start without hand over hand A.  He donned shirt without A but with one verbal cue and extra time.  The pants however, he needed more A to start to today over feet as he was very distracted.   Pt needed extra time and max verbal cues with oral care to completely rinse and spit.    Pt was transported to the gym to work on RUE AROM and coordination.  He actually followed cues fairly well with overhead reaching exercise with dowel bar and ball bounce activity off of dowel bar.  He then practiced tossing bean bags with his R hand.  Pt taken back to room to be transferred to bed to rest. Nurse tech offered to do that as she needed to change his sheets first.   Therapy Documentation Precautions:   Precautions Precautions: Fall Restrictions Weight Bearing Restrictions: No    Vital Signs: Therapy Vitals Pulse Rate: (!) 55 BP: (!) 144/67 Patient Position (if appropriate): Lying Pain: Pain Assessment Pain Scale: 0-10 Pain Score: 0-No pain ADL:    See Function Navigator for Current Functional Status.   Therapy/Group: Individual Therapy  Leilanny Fluitt 06/30/2018, 10:05 AM

## 2018-07-01 ENCOUNTER — Inpatient Hospital Stay (HOSPITAL_COMMUNITY): Payer: Medicare Other | Admitting: Speech Pathology

## 2018-07-01 ENCOUNTER — Inpatient Hospital Stay (HOSPITAL_COMMUNITY): Payer: Medicare Other | Admitting: Occupational Therapy

## 2018-07-01 ENCOUNTER — Inpatient Hospital Stay (HOSPITAL_COMMUNITY): Payer: Medicare Other | Admitting: Physical Therapy

## 2018-07-01 LAB — GLUCOSE, CAPILLARY
GLUCOSE-CAPILLARY: 106 mg/dL — AB (ref 70–99)
GLUCOSE-CAPILLARY: 136 mg/dL — AB (ref 70–99)
Glucose-Capillary: 84 mg/dL (ref 70–99)
Glucose-Capillary: 107 mg/dL — ABNORMAL HIGH (ref 70–99)

## 2018-07-01 LAB — OCCULT BLOOD X 1 CARD TO LAB, STOOL: Fecal Occult Bld: NEGATIVE

## 2018-07-01 MED ORDER — MEGESTROL ACETATE 400 MG/10ML PO SUSP
400.0000 mg | Freq: Two times a day (BID) | ORAL | Status: DC
  Administered 2018-07-01 – 2018-07-10 (×19): 400 mg via ORAL
  Filled 2018-07-01 (×19): qty 10
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
## 2018-07-01 NOTE — Progress Notes (Signed)
Occupational Therapy Session Note  Patient Details  Name: Mark Montoya MRN: [id number] Date of Birth: November 13, 1941  Today's Date: 07/01/2018 OT Individual Time: 0905-1000 OT Individual Time Calculation (min): 55 min   Short Term Goals: Week 2:  OT Short Term Goal 1 (Week 2): Pt will complete 1/3 toileting steps with touching assist OT Short Term Goal 2 (Week 2): Pt will complete LB dressing with no more than min A OT Short Term Goal 3 (Week 2): Pt will complete toilet transfer with consistent min A  Skilled Therapeutic Interventions/Progress Updates:    Pt greeted semi-reclined in bed and agreeable to OT treatment session. Nursing entered to take pt off of IV. Stand-pivot bed>wc>toilet>wc with CGA and verbal cues for body awareness. Pt's brief was soaked already and unable to void more in commode. Bathing/dressing completed wc at the sink. Focus on attention and sequencing within functional task. Pt needed between min -max verbal cues to maintain attention depending on distractions. Overall min A for BADL tasks with improved sit<>stand and standing balance. Pt left seated in wc at end of session with safety belt on and needs met.   Therapy Documentation Precautions:  Precautions Precautions: Fall Restrictions Weight Bearing Restrictions: No Pain:  none/denies pain See Function Navigator for Current Functional Status.   Therapy/Group: Individual Therapy  Valma Cava 07/01/2018, 9:30 AM

## 2018-07-01 NOTE — Plan of Care (Signed)
  Problem: Consults Goal: RH GENERAL PATIENT EDUCATION Description See Patient Education module for education specifics. Outcome: Progressing Goal: Skin Care Protocol Initiated - if Braden Score 18 or less Description If consults are not indicated, leave blank or document N/A Outcome: Progressing Goal: Nutrition Consult-if indicated Outcome: Progressing Goal: Diabetes Guidelines if Diabetic/Glucose > 140 Description If diabetic or lab glucose is > 140 mg/dl - Initiate Diabetes/Hyperglycemia Guidelines & Document Interventions  Outcome: Progressing   Problem: RH BOWEL ELIMINATION Goal: RH STG MANAGE BOWEL WITH ASSISTANCE Description STG Manage Bowel with mod  Assistance.  Outcome: Progressing Goal: RH STG MANAGE BOWEL W/MEDICATION W/ASSISTANCE Description STG Manage Bowel with Medication with min  Assistance.  Outcome: Progressing Goal: RH STG MANAGE BOWEL W/EQUIPMENT W/ASSISTANCE Description STG Manage Bowel With Equipment With min Assistance  Outcome: Progressing   Problem: RH BLADDER ELIMINATION Goal: RH STG MANAGE BLADDER WITH ASSISTANCE Description STG Manage Bladder With min  Assistance  Outcome: Progressing Goal: RH STG MANAGE BLADDER WITH MEDICATION WITH ASSISTANCE Description STG Manage Bladder With Medication With min Assistance.  Outcome: Progressing Goal: RH STG MANAGE BLADDER WITH EQUIPMENT WITH ASSISTANCE Description STG Manage Bladder With Equipment With Assistance Outcome: Progressing   Problem: RH SKIN INTEGRITY Goal: RH STG SKIN FREE OF INFECTION/BREAKDOWN Description Mod   Outcome: Progressing Goal: RH STG MAINTAIN SKIN INTEGRITY WITH ASSISTANCE Description STG Maintain Skin Integrity With min Assistance.  Outcome: Progressing   Problem: RH SAFETY Goal: RH STG ADHERE TO SAFETY PRECAUTIONS W/ASSISTANCE/DEVICE Description STG Adhere to Safety Precautions With min Assistance/Device.  Outcome: Progressing Goal: RH STG DECREASED RISK OF FALL  WITH ASSISTANCE Description STG Decreased Risk of Fall With min Assistance.  Outcome: Progressing Goal: RH STG DEMO UNDERSTANDING HOME SAFETY PRECAUTIONS Outcome: Progressing   Problem: RH KNOWLEDGE DEFICIT GENERAL Goal: RH STG INCREASE KNOWLEDGE OF SELF CARE AFTER HOSPITALIZATION Description Pt will be able to verbalize self care measures regarding skin breakdown, bowel/bladder management, and transferring safely with min assist.   Outcome: Progressing   Problem: Consults Goal: RH BRAIN INJURY PATIENT EDUCATION Description Description: See Patient Education module for eduction specifics Outcome: Progressing

## 2018-07-01 NOTE — Progress Notes (Signed)
[facility] PHYSICAL MEDICINE & REHABILITATION     PROGRESS NOTE    Subjective/Complaints: Pt rested fairly well. No new complaints. Denies pain  ROS: Patient denies fever, rash, sore throat, blurred vision, nausea, vomiting, diarrhea, cough, shortness of breath or chest pain, joint or back pain, headache, or mood change.    Objective:  No results found. Recent Labs    06/30/18 0606  WBC 7.5  HGB 9.2*  HCT 29.3*  PLT 159   Recent Labs    06/30/18 0606  NA 139  K 4.3  CL 106  GLUCOSE 96  BUN 23  CREATININE 1.59*  CALCIUM 8.6*   CBG (last 3)  Recent Labs    06/30/18 1648 06/30/18 2113 07/01/18 0714  GLUCAP 76 167* 84    Wt Readings from Last 3 Encounters:  06/25/18 80 kg  06/13/18 78.5 kg  05/19/17 79.4 kg     Intake/Output Summary (Last 24 hours) at 07/01/2018 0940 Last data filed at 07/01/2018 0900 Gross per 24 hour  Intake 840 ml  Output -  Net 840 ml    Vital Signs: Blood pressure (!) 168/65, pulse (!) 54, temperature 97.8 F (36.6 C), temperature source Oral, resp. rate 16, height 5\' 7"  (1.702 m), weight 80 kg, SpO2 97 %. Physical Exam:  Constitutional: No distress . Vital signs reviewed. HEENT: EOMI, oral membranes moist, abrasion healing Neck: supple Cardiovascular: RRR without murmur. No JVD    Respiratory: CTA Bilaterally without wheezes or rales. Normal effort    GI: BS +, non-tender, non-distended   Musculoskeletal:  No edema or tenderness in extremities  Neurological: He is more alert.  Expressive aphasia.  Following commands more consistently. Language clearer and more fluid today  right inattention  RLE hyperrefexia  Skin: Scalp wound with scab, healing.  Psychiatric: pleasant and cooperative    Assessment/Plan: 1. Functional deficits secondary to TBI which require 3+ hours per day of interdisciplinary therapy in a comprehensive inpatient rehab setting. Physiatrist is providing close team supervision and 24 hour management of  active medical problems listed below. Physiatrist and rehab team continue to assess barriers to discharge/monitor patient progress toward functional and medical goals.  Function:  Bathing Bathing position   Position: Other (comment)(sitting on BSC over toilet)  Bathing parts Body parts bathed by patient: Right upper leg, Left upper leg, Front perineal area, Right arm, Left arm, Chest, Abdomen, Right lower leg, Left lower leg, Buttocks Body parts bathed by helper: Back  Bathing assist Assist Level: Touching or steadying assistance(Pt > 75%)      Upper Body Dressing/Undressing Upper body dressing   What is the patient wearing?: Pull over shirt/dress     Pull over shirt/dress - Perfomed by patient: Thread/unthread right sleeve, Thread/unthread left sleeve, Put head through opening, Pull shirt over trunk Pull over shirt/dress - Perfomed by helper: Pull shirt over trunk        Upper body assist Assist Level: Touching or steadying assistance(Pt > 75%)      Lower Body Dressing/Undressing Lower body dressing   What is the patient wearing?: Pants     Pants- Performed by patient: Pull pants up/down Pants- Performed by helper: Thread/unthread right pants leg, Thread/unthread left pants leg, Fasten/unfasten pants   Non-skid slipper socks- Performed by helper: Don/doff right sock, Don/doff left sock               TED Hose - Performed by helper: Don/doff right TED hose, Don/doff left TED hose  Lower body assist Assist  for lower body dressing: Touching or steadying assistance (Pt > 75%)      Toileting Toileting Toileting activity did not occur: No continent bowel/bladder event Toileting steps completed by patient: Adjust clothing prior to toileting Toileting steps completed by helper: Adjust clothing prior to toileting, Performs perineal hygiene, Adjust clothing after toileting    Toileting assist Assist level: Two helpers, Supervision or verbal cues   Transfers Chair/bed  transfer Chair/bed transfer activity did not occur: Safety/medical concerns Chair/bed transfer method: Stand pivot Chair/bed transfer assist level: Touching or steadying assistance (Pt > 75%) Chair/bed transfer assistive device: Armrests, Patent attorneyWalker     Locomotion Ambulation     Max distance: 80 ft Assist level: Touching or steadying assistance (Pt > 75%)   Wheelchair   Type: Manual Max wheelchair distance: 100' Assist Level: Moderate assistance (Pt 50 - 74%)  Cognition Comprehension Comprehension assist level: Understands basic 50 - 74% of the time/ requires cueing 25 - 49% of the time  Expression Expression assist level: Expresses basic 50 - 74% of the time/requires cueing 25 - 49% of the time. Needs to repeat parts of sentences., Expresses basic 75 - 89% of the time/requires cueing 10 - 24% of the time. Needs helper to occlude trach/needs to repeat words.  Social Interaction Social Interaction assist level: Interacts appropriately 25 - 49% of time - Needs frequent redirection.  Problem Solving Problem solving assist level: Solves basic 25 - 49% of the time - needs direction more than half the time to initiate, plan or complete simple activities  Memory Memory assist level: Recognizes or recalls 25 - 49% of the time/requires cueing 50 - 75% of the time   Medical Problem List and Plan: 1.  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  DVT Prophylaxis/Anticoagulation: Subcutaneous heparin initiated 06/17/2018.  Monitor for any bleeding episodes 3. Pain Management: Tylenol as needed 4. Mood: Aricept 10 mg nightly, Xanax 0.125 mg nightly, Zoloft 100 mg daily, Seroquel 12.5 mg twice daily 5. Neuropsych: This patient is not capable of making decisions on his own behalf. 6. Skin/Wound Care: Routine skin checks  - scalp wound healing     7. Fluids/Electrolytes/Nutrition: encourage PO     \ -BUN improved to 23/1.59 8/12  -now on  thins and off IVF  -check BMET again on Thursday  -add megace foe appetite  8.  Dysphasia.  Dysphasia #1 -advanced to thins     9.  Hypertension.  Clonidine 0.3 mg TID, hydralazine 50 mg every 8 hours, Norvasc 10 mg daily.   -good control 8/12 10.  Diabetes mellitus.  Hemoglobin A1c 7.0.  Glucotrol 5 mg daily, Glucophage 1000 mg twice daily.    -glucotrol 5mg  bid now---watch for hypoglycemia  -still not eating consistently   -cover with SSI 11.  CKD stage III. Cr 1.59  -probably close to baseline 12.  BPH.  Flomax 0.8 mg daily.    13.  CAD with CABG 2003.  Aspirin currently on hold due to left temporal lobe hemorrhage.  No chest pain or shortness of breath 13. ABLA:     -stool OB negative, no signs of blood loss   -hgb 9.5 8/9   -continue to monitor 14. Fever:    -clinically improved  -afebrile, wbc's normal  -LLL pneumonia, s/p 1 week of abx---now off abx  -orthopanogram with lucencies, no signs of osteo         LOS (Days) 13 A FACE TO  FACE EVALUATION WAS PERFORMED  Ranelle Oyster, MD 07/01/2018 9:40 AM

## 2018-07-01 NOTE — Progress Notes (Signed)
Physical Therapy Session Note  Patient Details  Name: Mark Montoya MRN: [id number] Date of Birth: 26-Oct-1941  Today's Date: 07/01/2018 PT Individual Time: 1045-1150 PT Individual Time Calculation (min): 65 min   Short Term Goals: Week 2:  PT Short Term Goal 1 (Week 2): pt will perform gait x 100' with min A in controlled environment PT Short Term Goal 2 (Week 2): pt will consistently perform functional transfers with min A  Skilled Therapeutic Interventions/Progress Updates:    pt performs gait 150' x 2, 3150' with RW with min A, mod/max cuing for safety with RW. Pt tends to lose balance forward requiring min A to correct.  Stair negotiation 12 stairs x 2 with bilat handrails with min A.  Standing horseshoe task with encouragement with min A for balance with 1 UE support.  Pt with episode of incontinence of urine.  Pt performs stand pivot transfer to toilet with min A with grab bar, min A for balance with clothing management.  nustep for UE/LE strength and endurance x 5 minutes level 5.  Pt fatigued throughout session requiring frequent rests and frequent encouragement to participate.  Pt performs sit to supine with supervision. Left in bed with needs at hand, alarm set.  Therapy Documentation Precautions:  Precautions Precautions: Fall Restrictions Weight Bearing Restrictions: No Pain:  no c/o pain   Therapy/Group: Individual Therapy  Tomeca Helm 07/01/2018, 12:19 PM

## 2018-07-01 NOTE — Progress Notes (Signed)
Speech Language Pathology Daily Session Note  Patient Details  Name: Mark Montoya MRN: [id number] Date of Birth: September 09, 1941  Today's Date: 07/01/2018 SLP Individual Time: [id number] SLP Individual Time Calculation (min): 65 min  Short Term Goals: Week 2: SLP Short Term Goal 1 (Week 2): Patient will consume current diet with minimal overt s/s of aspiration with Min A verbal cues for use of swallowing strategies.  SLP Short Term Goal 2 (Week 2): Patient will demonstrate sustained attention to functional tasks for 10 minutes with Mod A verbal cues for redirection.  SLP Short Term Goal 3 (Week 2): Patient will self-monitor and correct verbal erros at the word level with Max A multimodal cues.  SLP Short Term Goal 4 (Week 2): Patient will attend to right field of enviornment/body with functional and familiar tasks with Mod A verbal cues.  SLP Short Term Goal 5 (Week 2): Patient will follow basic commands with functional and familiar tasks with Mod A multimodal cues.  SLP Short Term Goal 6 (Week 2): Patient will answer basic yes/no questions with Mod A verbal cues and 75% accuracy.   Skilled Therapeutic Interventions: Skilled treatment session focused on dysphagia and cognitive goals. Upon arrival, patient was awake in bed but agreeable to transfer to the wheelchair with maximize arousal and safety for PO intake. SLP facilitated session by providing Mod-Max A verbal cues for patient to utilize small bites/sips with lunch meal of Dys. 1 textures with thin liquids. Patient consumed meal without overt s/s of aspiration. Recommend patient continue current diet. SLP also facilitated session by providing Min A verbal cues for problem solving during tray set-up and self-feeding. However, patient required extra time and Max A verbal cues for problem solving and initiation during bed mobility and for problem solving while folding his laundry. Suspect increased cueing needed today due to fatigue. Patient left supine  in bed with alarm on and all needs within reach. Continue with current plan of care.      Function:  Eating Eating   Modified Consistency Diet: Yes Eating Assist Level: Supervision or verbal cues   Eating Set Up Assist For: Opening containers;Applying device (includes dentures)       Cognition Comprehension Comprehension assist level: Understands basic 50 - 74% of the time/ requires cueing 25 - 49% of the time  Expression   Expression assist level: Expresses basic 50 - 74% of the time/requires cueing 25 - 49% of the time. Needs to repeat parts of sentences.  Social Interaction Social Interaction assist level: Interacts appropriately 25 - 49% of time - Needs frequent redirection.  Problem Solving Problem solving assist level: Solves basic 25 - 49% of the time - needs direction more than half the time to initiate, plan or complete simple activities  Memory Memory assist level: Recognizes or recalls 25 - 49% of the time/requires cueing 50 - 75% of the time    Pain No/Denies Pain   Therapy/Group: Individual Therapy  Kalla Watson 07/01/2018, 1:40 PM

## 2018-07-02 ENCOUNTER — Inpatient Hospital Stay (HOSPITAL_COMMUNITY): Payer: Medicare Other

## 2018-07-02 ENCOUNTER — Inpatient Hospital Stay (HOSPITAL_COMMUNITY): Payer: Medicare Other | Admitting: Physical Therapy

## 2018-07-02 ENCOUNTER — Inpatient Hospital Stay (HOSPITAL_COMMUNITY): Payer: Medicare Other | Admitting: Occupational Therapy

## 2018-07-02 ENCOUNTER — Inpatient Hospital Stay (HOSPITAL_COMMUNITY): Payer: Medicare Other | Admitting: Speech Pathology

## 2018-07-02 LAB — GLUCOSE, CAPILLARY
GLUCOSE-CAPILLARY: 82 mg/dL (ref 70–99)
GLUCOSE-CAPILLARY: 96 mg/dL (ref 70–99)
GLUCOSE-CAPILLARY: 66 mg/dL — AB (ref 70–99)
Glucose-Capillary: 76 mg/dL (ref 70–99)

## 2018-07-02 NOTE — Plan of Care (Signed)
  Problem: Consults Goal: RH GENERAL PATIENT EDUCATION Description See Patient Education module for education specifics. Outcome: Progressing Goal: Skin Care Protocol Initiated - if Braden Score 18 or less Description If consults are not indicated, leave blank or document N/A Outcome: Progressing Goal: Nutrition Consult-if indicated Outcome: Progressing Goal: Diabetes Guidelines if Diabetic/Glucose > 140 Description If diabetic or lab glucose is > 140 mg/dl - Initiate Diabetes/Hyperglycemia Guidelines & Document Interventions  Outcome: Progressing   Problem: RH BOWEL ELIMINATION Goal: RH STG MANAGE BOWEL WITH ASSISTANCE Description STG Manage Bowel with mod  Assistance.  Outcome: Progressing Goal: RH STG MANAGE BOWEL W/MEDICATION W/ASSISTANCE Description STG Manage Bowel with Medication with min  Assistance.  Outcome: Progressing Goal: RH STG MANAGE BOWEL W/EQUIPMENT W/ASSISTANCE Description STG Manage Bowel With Equipment With min Assistance  Outcome: Progressing   Problem: RH BLADDER ELIMINATION Goal: RH STG MANAGE BLADDER WITH ASSISTANCE Description STG Manage Bladder With min  Assistance  Outcome: Progressing Goal: RH STG MANAGE BLADDER WITH MEDICATION WITH ASSISTANCE Description STG Manage Bladder With Medication With min Assistance.  Outcome: Progressing Goal: RH STG MANAGE BLADDER WITH EQUIPMENT WITH ASSISTANCE Description STG Manage Bladder With Equipment With Assistance Outcome: Progressing   Problem: RH SKIN INTEGRITY Goal: RH STG SKIN FREE OF INFECTION/BREAKDOWN Description Mod   Outcome: Progressing Goal: RH STG MAINTAIN SKIN INTEGRITY WITH ASSISTANCE Description STG Maintain Skin Integrity With min Assistance.  Outcome: Progressing   Problem: RH SAFETY Goal: RH STG ADHERE TO SAFETY PRECAUTIONS W/ASSISTANCE/DEVICE Description STG Adhere to Safety Precautions With min Assistance/Device.  Outcome: Progressing Goal: RH STG DECREASED RISK OF FALL  WITH ASSISTANCE Description STG Decreased Risk of Fall With min Assistance.  Outcome: Progressing Goal: RH STG DEMO UNDERSTANDING HOME SAFETY PRECAUTIONS Outcome: Progressing   Problem: RH KNOWLEDGE DEFICIT GENERAL Goal: RH STG INCREASE KNOWLEDGE OF SELF CARE AFTER HOSPITALIZATION Description Pt will be able to verbalize self care measures regarding skin breakdown, bowel/bladder management, and transferring safely with min assist.   Outcome: Progressing   Problem: Consults Goal: RH BRAIN INJURY PATIENT EDUCATION Description Description: See Patient Education module for eduction specifics Outcome: Progressing   

## 2018-07-02 NOTE — Progress Notes (Signed)
Speech Language Pathology Weekly Progress and Session Note  Patient Details  Name: CARLYLE MCELRATH MRN: [id number] Date of Birth: 1941/06/01  Beginning of progress report period: June 25, 2018 End of progress report period: July 02, 2018  Today's Date: 07/02/2018 SLP Individual Time: 0730-0830 SLP Individual Time Calculation (min): 60 min  Short Term Goals: Week 2: SLP Short Term Goal 1 (Week 2): Patient will consume current diet with minimal overt s/s of aspiration with Min A verbal cues for use of swallowing strategies.  SLP Short Term Goal 1 - Progress (Week 2): Met SLP Short Term Goal 2 (Week 2): Patient will demonstrate sustained attention to functional tasks for 10 minutes with Mod A verbal cues for redirection.  SLP Short Term Goal 2 - Progress (Week 2): Met SLP Short Term Goal 3 (Week 2): Patient will self-monitor and correct verbal erros at the word level with Max A multimodal cues.  SLP Short Term Goal 3 - Progress (Week 2): Not met SLP Short Term Goal 4 (Week 2): Patient will attend to right field of enviornment/body with functional and familiar tasks with Mod A verbal cues.  SLP Short Term Goal 4 - Progress (Week 2): Met SLP Short Term Goal 5 (Week 2): Patient will follow basic commands with functional and familiar tasks with Mod A multimodal cues.  SLP Short Term Goal 5 - Progress (Week 2): Met SLP Short Term Goal 6 (Week 2): Patient will answer basic yes/no questions with Mod A verbal cues and 75% accuracy.  SLP Short Term Goal 6 - Progress (Week 2): Met    New Short Term Goals: Week 3: SLP Short Term Goal 1 (Week 3): Patient will consume current diet with minimal overt s/s of aspiration with supervision verbal cues for use of swallowing strategies.  SLP Short Term Goal 2 (Week 3): Patient will demonstrate sustained attention to functional tasks for 10 minutes with Min A verbal cues for redirection.  SLP Short Term Goal 3 (Week 3): Patient will attend to right field of  enviornment/body with functional and familiar tasks with Min A verbal cues.  SLP Short Term Goal 4 (Week 3): Patient will follow basic commands with functional and familiar tasks with Min A multimodal cues.  SLP Short Term Goal 5 (Week 3): Patient will self-monitor and correct verbal errors at the word level with Max A multimodal cues.   Weekly Progress Updates: Patient has made excellent gains and has met 5 of 6 STG's this reporting period. Currently, patient is consuming Dys. 1 textures with thin liquids with minimal overt s/s of aspiration and overall Min A verbal cues for use of swallowing compensatory strategies. Patient continues to be aphasic with minimal ability to self-correct errors, however, patient can express his basic wants/needs at the word and phrase level with overall extra time and Min-Mod A multimodal cues. Patient also continues to require Mod-Max A verbal cues to complete functional and familiar tasks safely in regards to attention, awareness and problem solving. Patient and family education ongoing. Patient would benefit from continued skilled SLP intervention to maximize his cognitive-linguistic and swallowing function prior to discharge.      Intensity: Minumum of 1-2 x/day, 30 to 90 minutes Frequency: 3 to 5 out of 7 days Duration/Length of Stay: 8/22 Treatment/Interventions: Cognitive remediation/compensation;Environmental controls;Internal/external aids;Speech/Language facilitation;Therapeutic Activities;Patient/family education;Functional tasks;Dysphagia/aspiration precaution training;Cueing hierarchy   Daily Session  Skilled Therapeutic Interventions: Skilled treatment session focused on cognitive and dysphagia goals. Upon arrival, patient was asleep while supine in bed but  easily awakened. SLP facilitated session by repositioning patient to maximize arousal and safety for PO intake.  Patient consumed Dys. 1 textures with thin liquids without overt s/s of aspiration but  required Min-Mod A verbal cues for use of small bites/sips and Min A verbal cues for initiation with self-feeding throughout meal due to fatigue. Patient was essentially unintelligible today at the phrase and sentence level when expressing wants/needs, suspect due to fatigue and lethargy. Recommend patient continue current diet. Patient left upright in bed with nursing present. Continue with current plan of care.      Function:   Eating Eating   Modified Consistency Diet: Yes Eating Assist Level: Supervision or verbal cues;Helper scoops food on utensil   Eating Set Up Assist For: Opening containers Helper [place] on Utensil: Occasionally Helper Brings Food to Mouth: Every scoop   Cognition Comprehension Comprehension assist level: Understands basic 50 - 74% of the time/ requires cueing 25 - 49% of the time  Expression   Expression assist level: Expresses basic 50 - 74% of the time/requires cueing 25 - 49% of the time. Needs to repeat parts of sentences.  Social Interaction Social Interaction assist level: Interacts appropriately 25 - 49% of time - Needs frequent redirection.  Problem Solving Problem solving assist level: Solves basic 25 - 49% of the time - needs direction more than half the time to initiate, plan or complete simple activities  Memory Memory assist level: Recognizes or recalls 25 - 49% of the time/requires cueing 50 - 75% of the time   Pain Pain Assessment Pain Scale: 0-10 Pain Score: 0-No pain  Therapy/Group: Individual Therapy  Shatana Saxton 07/02/2018, 11:49 AM

## 2018-07-02 NOTE — Progress Notes (Signed)
Occupational Therapy Session Note  Patient Details  Name: Mark Montoya MRN: [id number] Date of Birth: 03/05/1941  Today's Date: 07/02/2018 OT Individual Time: 1415-1530 OT Individual Time Calculation (min): 75 min   Short Term Goals: Week 2:  OT Short Term Goal 1 (Week 2): Pt will complete 1/3 toileting steps with touching assist OT Short Term Goal 2 (Week 2): Pt will complete LB dressing with no more than min A OT Short Term Goal 3 (Week 2): Pt will complete toilet transfer with consistent min A  Skilled Therapeutic Interventions/Progress Updates:    Pt greeted seated in wc and agreeable to OT. Asked pt if he needed to use the bathroom, and pt stated, "yes." Stand-pivot to commode using grab bars with increased time to initiate and CGA. Pt needed min A for balance while pushing pants off of hips. Pt with continent large watery BM. Pt able to assist with peri-care, but needed assist from OT for thoroughness and clothing management. Standing balance/endurance to wash hands at the sink. Worked on sequencing and visual scanning within activity with pt needed mod instructional cues. Pt noted to have something black on mouth. Oral care completed seated in wc at the sink with pt undershooting toothpaste placement on brush head requiring hand over hand A. Instructional cues to initiate and terminate activity. Pt brought to dayroom in wc for graded beading activity. Pt returned to bed at end of session with min A stand-pivot. Pt left semi-reclined in bed with needs met.   Therapy Documentation Precautions:  Precautions Precautions: Fall Restrictions Weight Bearing Restrictions: No Pain: None/denies pain See Function Navigator for Current Functional Status.  Therapy/Group: Individual Therapy  Valma Cava 07/02/2018, 2:50 PM

## 2018-07-02 NOTE — Progress Notes (Signed)
[place] PHYSICAL MEDICINE & REHABILITATION     PROGRESS NOTE    Subjective/Complaints: Pt up in bed. No new complaints. Able to sleep somewhat  ROS: Limited due to cognitive/behavioral    Objective:  No results found. Recent Labs    06/30/18 0606  WBC 7.5  HGB 9.2*  HCT 29.3*  PLT 159   Recent Labs    06/30/18 0606  NA 139  K 4.3  CL 106  GLUCOSE 96  BUN 23  CREATININE 1.59*  CALCIUM 8.6*   CBG (last 3)  Recent Labs    07/01/18 2110 07/02/18 0633 07/02/18 1200  GLUCAP 136* 82 96    Wt Readings from Last 3 Encounters:  06/25/18 80 kg  06/13/18 78.5 kg  05/19/17 79.4 kg     Intake/Output Summary (Last 24 hours) at 07/02/2018 1604 Last data filed at 07/02/2018 1230 Gross per 24 hour  Intake 720 ml  Output -  Net 720 ml    Vital Signs: Blood pressure 115/77, pulse (!) 56, temperature 97.9 F (36.6 C), resp. rate 18, height 5\' 7"  (1.702 m), weight 80 kg, SpO2 99 %. Physical Exam:  Constitutional: No distress . Vital signs reviewed. HEENT: EOMI, oral membranes moist Neck: supple Cardiovascular: RRR without murmur. No JVD    Respiratory: CTA Bilaterally without wheezes or rales. Normal effort    GI: BS +, non-tender, non-distended   Musculoskeletal:  No edema or tenderness in extremities  Neurological: He is more alert.  Expressive aphasia.  Following commands more consistently. Language clearer and more fluid today  right inattention   Skin: Scalp wound with scab, healed--staples.  Psychiatric: pleasant and cooperative    Assessment/Plan: 1. Functional deficits secondary to TBI which require 3+ hours per day of interdisciplinary therapy in a comprehensive inpatient rehab setting. Physiatrist is providing close team supervision and 24 hour management of active medical problems listed below. Physiatrist and rehab team continue to assess barriers to discharge/monitor patient progress toward functional and medical  goals.  Function:  Bathing Bathing position   Position: Wheelchair/chair at sink  Bathing parts Body parts bathed by patient: Right upper leg, Left upper leg, Front perineal area, Right arm, Left arm, Chest, Abdomen, Right lower leg, Left lower leg, Buttocks Body parts bathed by helper: Back  Bathing assist Assist Level: Touching or steadying assistance(Pt > 75%)      Upper Body Dressing/Undressing Upper body dressing   What is the patient wearing?: Pull over shirt/dress     Pull over shirt/dress - Perfomed by patient: Thread/unthread right sleeve, Thread/unthread left sleeve, Put head through opening, Pull shirt over trunk Pull over shirt/dress - Perfomed by helper: Pull shirt over trunk        Upper body assist Assist Level: Supervision or verbal cues, Set up, More than reasonable time      Lower Body Dressing/Undressing Lower body dressing   What is the patient wearing?: Pants, Non-skid slipper socks     Pants- Performed by patient: Thread/unthread right pants leg, Thread/unthread left pants leg, Pull pants up/down Pants- Performed by helper: Thread/unthread right pants leg, Thread/unthread left pants leg, Fasten/unfasten pants Non-skid slipper socks- Performed by patient: Don/doff right sock, Don/doff left sock Non-skid slipper socks- Performed by helper: Don/doff right sock, Don/doff left sock               TED Hose - Performed by helper: Don/doff right TED hose, Don/doff left TED hose  Lower body assist Assist for lower body dressing: Touching or  steadying assistance (Pt > 75%)      Toileting Toileting Toileting activity did not occur: No continent bowel/bladder event Toileting steps completed by patient: Adjust clothing prior to toileting Toileting steps completed by helper: Performs perineal hygiene, Adjust clothing after toileting    Toileting assist Assist level: Touching or steadying assistance (Pt.75%)   Transfers Chair/bed transfer Chair/bed transfer  activity did not occur: Safety/medical concerns Chair/bed transfer method: Ambulatory Chair/bed transfer assist level: Touching or steadying assistance (Pt > 75%) Chair/bed transfer assistive device: Armrests, Patent attorneyWalker     Locomotion Ambulation     Max distance: 12260ft Assist level: Touching or steadying assistance (Pt > 75%)   Wheelchair   Type: Manual Max wheelchair distance: 100' Assist Level: Moderate assistance (Pt 50 - 74%)  Cognition Comprehension Comprehension assist level: Understands basic 50 - 74% of the time/ requires cueing 25 - 49% of the time  Expression Expression assist level: Expresses basic 50 - 74% of the time/requires cueing 25 - 49% of the time. Needs to repeat parts of sentences.  Social Interaction Social Interaction assist level: Interacts appropriately 25 - 49% of time - Needs frequent redirection.  Problem Solving Problem solving assist level: Solves basic 25 - 49% of the time - needs direction more than half the time to initiate, plan or complete simple activities  Memory Memory assist level: Recognizes or recalls 25 - 49% of the time/requires cueing 50 - 75% of the time   Medical Problem List and Plan: 1.  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  DVT Prophylaxis/Anticoagulation: Subcutaneous heparin initiated 06/17/2018.  Monitor for any bleeding episodes 3. Pain Management: Tylenol as needed 4. Mood: Aricept 10 mg nightly, Xanax 0.125 mg nightly, Zoloft 100 mg daily, Seroquel 12.5 mg twice daily 5. Neuropsych: This patient is not capable of making decisions on his own behalf. 6. Skin/Wound Care: Routine skin checks  - scalp wound healing     7. Fluids/Electrolytes/Nutrition: encourage PO     \ -BUN improved to 23/1.59 8/12  -now on thins and off IVF  -check BMET again on Thursday  -added megace foe appetite. Seemed to eat better yesterday afternoon and this morning 8.  Dysphasia.   Dysphasia #1 -advanced to thins     9.  Hypertension.  Clonidine 0.3 mg TID, hydralazine 50 mg every 8 hours, Norvasc 10 mg daily.   -good control 8/12 10.  Diabetes mellitus.  Hemoglobin A1c 7.0.  Glucotrol 5 mg daily, Glucophage 1000 mg twice daily.    -glucotrol 5mg  bid now---   -follow for more consistent PO intake   -cover with SSI 11.  CKD stage III. Cr 1.59  -probably close to baseline 12.  BPH.  Flomax 0.8 mg daily.    13.  CAD with CABG 2003.  Aspirin currently on hold due to left temporal lobe hemorrhage.  No chest pain or shortness of breath 13. ABLA:     -stool OB negative, no signs of blood loss   -hgb 9.5 8/9   -continue to monitor 14. Fever:    -clinically improved  -afebrile, wbc's normal  -LLL pneumonia, s/p 1 week of abx---now off abx  -orthopanogram with lucencies, no signs of osteo         LOS (Days) 14 A FACE TO FACE EVALUATION WAS PERFORMED  Ranelle OysterZachary T Leondra Cullin, MD 07/02/2018 4:04 PM

## 2018-07-02 NOTE — Progress Notes (Signed)
Physical Therapy Session Note  Patient Details  Name: Mark Montoya F Sikora MRN: [id number] Date of Birth: 11-24-1940  Today's Date: 07/02/2018 PT Individual Time: 1100-1215 PT Individual Time Calculation (min): 75 min   Short Term Goals:  Week 2:  PT Short Term Goal 1 (Week 2): pt will perform gait x 100' with min A in controlled environment PT Short Term Goal 2 (Week 2): pt will consistently perform functional transfers with min A Week 3:     Skilled Therapeutic Interventions/Progress Updates:     PT donned TEDS while pt was in bed, and shoes with pt sitting in w/c.  Min assist supine> sit EOB.  Stand pivot transfer with RW to L, min assist.    Gait training with grocery cart as AD, on level tile with turns L and R, x 90' x 2 with min> mod assist.  Pt unsafely pushes cart too far ahead; mod cues to keep it at a safe distance and speed.  Balance retraining via external peruurbations; pt demonstrated delayed bil ankle strategies and minimal hip strategy.  Standing on Airex mat with anterior bias. X 1 minute with A-P swaying but only 1 LOB requriing assistance.  Upon attempted standing on wedge, pt had bowel accident, and verbalized this.    Toilet transfer for large amounts of further diarrhea.  +2 for hygiene and donning clothes in standing. Pt left in care of LeeAnn,NT.    Therapy Documentation Precautions:  Precautions Precautions: Fall Restrictions Weight Bearing Restrictions: No  Pain: none per pt       See Function Navigator for Current Functional Status.   Therapy/Group: Individual Therapy  Alaa Eyerman 07/02/2018, 4:33 PM

## 2018-07-02 NOTE — Progress Notes (Signed)
Physical Therapy Session Note  Patient Details  Name: Mark Montoya MRN: [id number] Date of Birth: 1941/02/20  Today's Date: 07/02/2018 PT Individual Time: 9584-4171 PT Individual Time Calculation (min): 30 min   Short Term Goals: Week 1:  PT Short Term Goal 1 (Week 1): Pt will consistenty perform bed mobility with mod assist consistently  PT Short Term Goal 1 - Progress (Week 1): Met PT Short Term Goal 2 (Week 1): Pt will transfer to [facility] with mod assist consistently  PT Short Term Goal 2 - Progress (Week 1): Met PT Short Term Goal 3 (Week 1): Pt will ambulate >173f with mod assist and LRAD  PT Short Term Goal 3 - Progress (Week 1): Progressing toward goal PT Short Term Goal 4 (Week 1): pt will maintain standing balance with min assist x 2 min  PT Short Term Goal 4 - Progress (Week 1): Met Week 2:  PT Short Term Goal 1 (Week 2): pt will perform gait x 100' with min A in controlled environment PT Short Term Goal 2 (Week 2): pt will consistently perform functional transfers with min A  Skilled Therapeutic Interventions/Progress Updates:   Pt received supine in bed and agreeable to PT. Supine>sit transfer with min assist and min cues for use of bed features.   PT instructed pt in donning pants and socks sitting EOB with supervision assist.   Gait training with RW 2 x 1656fwith min assist from PT for AD management, improved posture, and safety in turns.   Pt returned to room and performed ambulatory transfer to bed with min assist for safety. Sit>supine completed with supervision assist, and left supine in bed with call bell in reach and all needs met.          Therapy Documentation Precautions:  Precautions Precautions: Fall Restrictions Weight Bearing Restrictions: No Pain: Pain Assessment Pain Scale: 0-10 Pain Score: 0-No pain   See Function Navigator for Current Functional Status.   Therapy/Group: Individual Therapy  AuLorie Phenix/14/2019, 9:57 AM

## 2018-07-03 ENCOUNTER — Encounter (HOSPITAL_COMMUNITY): Payer: Medicare Other | Admitting: Occupational Therapy

## 2018-07-03 ENCOUNTER — Inpatient Hospital Stay (HOSPITAL_COMMUNITY): Payer: Medicare Other | Admitting: Occupational Therapy

## 2018-07-03 ENCOUNTER — Inpatient Hospital Stay (HOSPITAL_COMMUNITY): Payer: Medicare Other | Admitting: Physical Therapy

## 2018-07-03 ENCOUNTER — Inpatient Hospital Stay (HOSPITAL_COMMUNITY): Payer: Medicare Other | Admitting: Speech Pathology

## 2018-07-03 LAB — GLUCOSE, CAPILLARY
GLUCOSE-CAPILLARY: 58 mg/dL — AB (ref 70–99)
GLUCOSE-CAPILLARY: 88 mg/dL (ref 70–99)
GLUCOSE-CAPILLARY: 95 mg/dL (ref 70–99)
GLUCOSE-CAPILLARY: 98 mg/dL (ref 70–99)
Glucose-Capillary: 66 mg/dL — ABNORMAL LOW (ref 70–99)
Glucose-Capillary: 72 mg/dL (ref 70–99)
Glucose-Capillary: 62 mg/dL — ABNORMAL LOW (ref 70–99)
Glucose-Capillary: 95 mg/dL (ref 70–99)
Glucose-Capillary: 80 mg/dL (ref 70–99)

## 2018-07-03 MED ORDER — GLUCOSE 40 % PO GEL
ORAL | Status: AC
  Administered 2018-07-03: 37.5 g
  Administered 2018-07-03: 12:00:00
  Filled 2018-07-03: qty 1

## 2018-07-03 NOTE — Progress Notes (Signed)
Speech Language Pathology Daily Session Note  Patient Details  Name: Mark Montoya MRN: [id number] Date of Birth: 09/18/1941  Today's Date: 07/03/2018 SLP Individual Time: 1100-1207 SLP Individual Time Calculation (min): 67 min  Short Term Goals: Week 3: SLP Short Term Goal 1 (Week 3): Patient will consume current diet with minimal overt s/s of aspiration with supervision verbal cues for use of swallowing strategies.  SLP Short Term Goal 2 (Week 3): Patient will demonstrate sustained attention to functional tasks for 10 minutes with Min A verbal cues for redirection.  SLP Short Term Goal 3 (Week 3): Patient will attend to right field of enviornment/body with functional and familiar tasks with Min A verbal cues.  SLP Short Term Goal 4 (Week 3): Patient will follow basic commands with functional and familiar tasks with Min A multimodal cues.  SLP Short Term Goal 5 (Week 3): Patient will self-monitor and correct verbal errors at the word level with Max A multimodal cues.   Skilled Therapeutic Interventions: Skilled treatment session focused on family education and dysphagia goals. Upon arrival, patient was upright in the wheelchair but was exteremly lethargic and required Max verbal and tactile cues for arousal. SLP facilitated session by providing patient with extra time and Mod-Max A verbal and visual cues for problem solving during oral care at the sink. Patient consumed lunch meal of Dys. 1 textures with thin liquids without overt s/s of aspiration but required Mod A verbal cues for use of small bites/sips. Patient's family present throughout session and educated in regards to patient's current swallowing and cognitive-linguistic function and strategies to utilize at home to maximize verbal expression, safety with PO intake and problem solving/safety at home in regards to cognitive functioning. All verbalized understanding. Patient left upright in wheelchair with alarm on and visitor present.  Continue with current plan of care.      Function:  Eating Eating   Modified Consistency Diet: Yes Eating Assist Level: Supervision or verbal cues;Helper scoops food on utensil   Eating Set Up Assist For: Opening containers Helper Scoops Food on Utensil: Occasionally     Cognition Comprehension Comprehension assist level: Understands basic 50 - 74% of the time/ requires cueing 25 - 49% of the time  Expression   Expression assist level: Expresses basic 50 - 74% of the time/requires cueing 25 - 49% of the time. Needs to repeat parts of sentences.  Social Interaction Social Interaction assist level: Interacts appropriately 25 - 49% of time - Needs frequent redirection.  Problem Solving Problem solving assist level: Solves basic 25 - 49% of the time - needs direction more than half the time to initiate, plan or complete simple activities  Memory Memory assist level: Recognizes or recalls 25 - 49% of the time/requires cueing 50 - 75% of the time    Pain No/Denies Pain   Therapy/Group: Individual Therapy  Margerie Fraiser 07/03/2018, 12:56 PM

## 2018-07-03 NOTE — Progress Notes (Signed)
Occupational Therapy Weekly Progress Note  Patient Details  Name: Mark Montoya MRN: [id number] Date of Birth: 19-Nov-1941  Beginning of progress report period: June 18, 2018 End of progress report period: July 03, 2018  Today's Date: 07/03/2018 OT Individual Time: 1000-1055 OT Individual Time Calculation (min): 55 min   Patient has met 3 of 3 short term goals.  Pt has made steady progress towards OT goals this week. He has demonstrated improved sit<>stand, but continues to need increased time and verbal cues for safety awareness and CGA for dynamic task within BADLs. t has also been progressing within bathing/dressing tasks to an overall CGA/supervision level-mostly needs cues for safety awareness, initiation, and sequencing.   Patient continues to demonstrate the following deficits: muscle weakness, impaired timing and sequencing, decreased coordination and decreased motor planning, decreased attention to right, decreased initiation, decreased attention, decreased awareness, decreased problem solving, decreased safety awareness, decreased memory and delayed processing and decreased standing balance, decreased postural control and decreased balance strategies and therefore will continue to benefit from skilled OT intervention to enhance overall performance with BADL and Reduce care partner burden.  Patient progressing toward long term goals..  Continue plan of care.  OT Short Term Goals Week 2:  OT Short Term Goal 1 (Week 2): Pt will complete 1/3 toileting steps with touching assist OT Short Term Goal 1 - Progress (Week 2): Met OT Short Term Goal 2 (Week 2): Pt will complete LB dressing with no more than min A OT Short Term Goal 2 - Progress (Week 2): Met OT Short Term Goal 3 (Week 2): Pt will complete toilet transfer with consistent min A OT Short Term Goal 3 - Progress (Week 2): Met Week 3:  OT Short Term Goal 1 (Week 3): STG=LTG 2.2 ELOS  Skilled Therapeutic Interventions/Progress  Updates:    OT treatments session focused on dc planning and pt/family education. Pt greeted sitting in wc, more lethargic this morning after PT session. Pt brought down to apartment in wc and discussed home with family. Had pt's granddaughter ambulate with pt into bathroom and complete tub shower transfer with pt. Pt needed max multimodal cues to safely sit onto tub bench, but his granddaughter was able to provide appropriate cues and keep pt safe. Pt also able to lift BLEs in and out of tub. Discussed home bathroom set-up and modifications for safe BADL participation. Discussed home DME needs, energy conservation techniques, and ways to decrease falls risk. Pt left seated in wc at end of session with alarm belt on and family present.   Therapy Documentation Precautions:  Precautions Precautions: Fall Restrictions Weight Bearing Restrictions: No Pain: None/denies pain  See Function Navigator for Current Functional Status.   Therapy/Group: Individual Therapy  Valma Cava 07/03/2018, 10:56 AM

## 2018-07-03 NOTE — Progress Notes (Signed)
[facility] PH[facility]ICINE & REHABILITATION     PROGRESS NOTE    Subjective/Complaints: State he didn't sleep well---restless. Denies pain. Wants some "greens"  ROS: Limited due to cognitive/behavioral   Objective:  No results found. No results for input(s): WBC, HGB, HCT, PLT in the last 72 hours. No results for input(s): NA, K, CL, GLUCOSE, BUN, CREATININE, CALCIUM in the last 72 hours.  Invalid input(s): CO CBG (last 3)  Recent Labs    07/03/18 0143 07/03/18 0651 07/03/18 0808  GLUCAP 72 98 95    Wt Readings from Last 3 Encounters:  06/25/18 80 kg  06/13/18 78.5 kg  05/19/17 79.4 kg     Intake/Output Summary (Last 24 hours) at 07/03/2018 1109 Last data filed at 07/02/2018 1700 Gross per 24 hour  Intake 480 ml  Output -  Net 480 ml    Vital Signs: Blood pressure (!) 147/67, pulse 60, temperature 98.4 F (36.9 C), temperature source Oral, resp. rate 18, height 5\' 7"  (1.702 m), weight 80 kg, SpO2 96 %. Physical Exam:  Constitutional: No distress . Vital signs reviewed. HEENT: EOMI, oral membranes moist Neck: supple Cardiovascular: RRR without murmur. No JVD    Respiratory: CTA Bilaterally without wheezes or rales. Normal effort    GI: BS +, non-tender, non-distended   Musculoskeletal:  No edema or tenderness in extremities  Neurological: He is more alert.  Expressive aphasia.  Following commands more consistently. Language clearer and more fluid today Right neglect  Skin: Scalp wound with scab, healed--staples.  Psychiatric: pleasant and cooperative    Assessment/Plan: 1. Functional deficits secondary to TBI which require 3+ hours per day of interdisciplinary therapy in a comprehensive inpatient rehab setting. Physiatrist is providing close team supervision and 24 hour management of active medical problems listed below. Physiatrist and rehab team continue to assess barriers to discharge/monitor patient progress toward functional and medical  goals.  Function:  Bathing Bathing position   Position: Wheelchair/chair at sink  Bathing parts Body parts bathed by patient: Right upper leg, Left upper leg, Front perineal area, Right arm, Left arm, Chest, Abdomen, Right lower leg, Left lower leg, Buttocks Body parts bathed by helper: Back  Bathing assist Assist Level: Touching or steadying assistance(Pt > 75%)      Upper Body Dressing/Undressing Upper body dressing   What is the patient wearing?: Pull over shirt/dress     Pull over shirt/dress - Perfomed by patient: Thread/unthread right sleeve, Thread/unthread left sleeve, Put head through opening, Pull shirt over trunk Pull over shirt/dress - Perfomed by helper: Pull shirt over trunk        Upper body assist Assist Level: Supervision or verbal cues, Set up, More than reasonable time      Lower Body Dressing/Undressing Lower body dressing   What is the patient wearing?: Pants, Non-skid slipper socks     Pants- Performed by patient: Thread/unthread right pants leg, Thread/unthread left pants leg, Pull pants up/down Pants- Performed by helper: Thread/unthread right pants leg, Thread/unthread left pants leg, Fasten/unfasten pants Non-skid slipper socks- Performed by patient: Don/doff right sock, Don/doff left sock Non-skid slipper socks- Performed by helper: Don/doff right sock, Don/doff left sock               TED Hose - Performed by helper: Don/doff right TED hose, Don/doff left TED hose  Lower body assist Assist for lower body dressing: Touching or steadying assistance (Pt > 75%)      Toileting Toileting Toileting activity did not occur: No continent  bowel/bladder event Toileting steps completed by patient: Adjust clothing prior to toileting Toileting steps completed by helper: Performs perineal hygiene, Adjust clothing after toileting    Toileting assist Assist level: Touching or steadying assistance (Pt.75%)   Transfers Chair/bed transfer Chair/bed transfer  activity did not occur: Safety/medical concerns Chair/bed transfer method: Ambulatory Chair/bed transfer assist level: Touching or steadying assistance (Pt > 75%) Chair/bed transfer assistive device: Armrests, Patent attorneyWalker     Locomotion Ambulation     Max distance: 16960ft Assist level: Touching or steadying assistance (Pt > 75%)   Wheelchair   Type: Manual Max wheelchair distance: 100' Assist Level: Moderate assistance (Pt 50 - 74%)  Cognition Comprehension Comprehension assist level: Understands basic 50 - 74% of the time/ requires cueing 25 - 49% of the time  Expression Expression assist level: Expresses basic 50 - 74% of the time/requires cueing 25 - 49% of the time. Needs to repeat parts of sentences.  Social Interaction Social Interaction assist level: Interacts appropriately 25 - 49% of time - Needs frequent redirection.  Problem Solving Problem solving assist level: Solves basic 25 - 49% of the time - needs direction more than half the time to initiate, plan or complete simple activities  Memory Memory assist level: Recognizes or recalls 25 - 49% of the time/requires cueing 50 - 75% of the time   Medical Problem List and Plan: 1.  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  DVT Prophylaxis/Anticoagulation: Subcutaneous heparin initiated 06/17/2018.  Monitor for any bleeding episodes 3. Pain Management: Tylenol as needed 4. Mood: Aricept 10 mg nightly, Xanax 0.125 mg nightly, Zoloft 100 mg daily, Seroquel 12.5 mg twice daily 5. Neuropsych: This patient is not capable of making decisions on his own behalf. 6. Skin/Wound Care: Routine skin checks  - scalp wound healing     7. Fluids/Electrolytes/Nutrition: encourage PO     \ -BUN improved to 23/1.59 8/12---recheck tomorrow  -now on thins    -added megace foe appetite.  Intake picking up 8.  Dysphasia.  Dysphasia #1 -advanced to thins     9.  Hypertension.  Clonidine  0.3 mg TID, hydralazine 50 mg every 8 hours, Norvasc 10 mg daily.   -good control 8/15 10.  Diabetes mellitus.  Hemoglobin A1c 7.0.  Glucotrol 5 mg daily, Glucophage 1000 mg twice daily.    -continue glucotrol 5mg  bid now---    -cover with SSI as intake improves 11.  CKD stage III. Cr 1.59   12.  BPH.  Flomax 0.8 mg daily.    13.  CAD with CABG 2003.  Aspirin currently on hold due to left temporal lobe hemorrhage.  No chest pain or shortness of breath 13. ABLA:     -stool OB negative, no signs of blood loss   -hgb 9.5 8/9   [facility] 14. Fever:    -afebrile, wbc's normal  -LLL pneumonia, s/p 1 week of abx---now off abx           LOS (Days) 15 A FACE TO FACE EVALUATION WAS PERFORMED  Ranelle OysterZachary T Swartz, MD 07/03/2018 11:09 AM

## 2018-07-03 NOTE — Progress Notes (Addendum)
Social Work Patient ID: Elgie CollardEarl F Chiaramonte, male   DOB: Jun 20, 1941, 77 y.o.   MRN: [id number]   Lucila MaineGrandson and grandaughter in today for family education and all went well.  Grandaughter, Kennyth ArnoldStacy, reports that she was pleased with progress and feels comfortable with LOC needed.  Discussed possible need for hospital bed but will follow up with pt/fam early next week to confirm.  Continue to plan for d/c 8/22.  Tally Mattox, LCSW

## 2018-07-03 NOTE — Progress Notes (Signed)
Occupational Therapy Session Note  Patient Details  Name: Mark Montoya MRN: [id number] Date of Birth: [birth year]  Today's Date: 07/03/2018 OT Individual Time: 1600-1630 OT Individual Time Calculation (min): 30 min   Skilled Therapeutic Interventions/Progress Updates: patient very difficult to engage.   He did not attend and did not follow instructions.   He was disoriented and spoke of situations not applicable to present.   He was not able to follow 1 step command to match two of three cards.   He would not stand up for endurance or balance activities;  So, all his session was attempted sitting edge of bed.   He was assisted back to supine bed with close S.   Call bell was engaged and call bell was within reach.     Therapy Documentation Precautions:  Precautions Precautions: Fall Restrictions Weight Bearing Restrictions: No  Pain:denied   Therapy/Group: Individual Therapy  Bud Faceickett, Meliyah Simon [facility] HospitalYeary 07/03/2018, 11:24 PM

## 2018-07-03 NOTE — Significant Event (Signed)
Hypoglycemic Event  CBG 62 Treatment: Gluctose15 gel  Symptoms: none  Follow-up CBG1231: Time CBG Result 95  Possible Reasons for Event: decrease intake disease process  Comments/MD notified:Dan Angiulli PA notified    Claudean SeveranceMelanie M Marin Wisner

## 2018-07-03 NOTE — Progress Notes (Signed)
Physical Therapy Weekly Progress Note  Patient Details  Name: ARLYN BUERKLE MRN: [id number] Date of Birth: April 26, 1941  Beginning of progress report period: June 26, 2018 End of progress report period: July 03, 2018  Today's Date: 07/03/2018 PT Individual Time: 0825-0926 PT Individual Time Calculation (min): 61 min   Patient has met 2 of 2 short term goals.  Pt improved consistency with therapy and is consistently at a min A level for gait and transfers.  Patient continues to demonstrate the following deficits muscle weakness, decreased initiation, decreased attention, decreased awareness, decreased problem solving, decreased safety awareness, decreased memory and delayed processing and decreased standing balance, decreased postural control and decreased balance strategies and therefore will continue to benefit from skilled PT intervention to increase functional independence with mobility.  Patient progressing toward long term goals..  Continue plan of care.  PT Short Term Goals Week 2:  PT Short Term Goal 1 (Week 2): pt will perform gait x 100' with min A in controlled environment PT Short Term Goal 1 - Progress (Week 2): Met PT Short Term Goal 2 (Week 2): pt will consistently perform functional transfers with min A PT Short Term Goal 2 - Progress (Week 2): Met Week 3:  PT Short Term Goal 1 (Week 3): = LTG  Skilled Therapeutic Interventions/Progress Updates:    pt rec'd finishing breakfast with nursing.  Pt performs supine to sit with supervision, increased time and cuing.  Pt able to don pants with increased time and cues.  Gait training with RW 50', 100' with min guard, improved balance and safety with RW this session.  Standing balance on foam with horseshoe toss with min/mod A due to anterior LOB, cues for attention to task.  Pt's family present for education. Pt's family observed gait, stair negotiation and transfers at min A/min guard level and state they feel comfortable with  caring for pt at this level of care.  Pt performs w/c with supervision x 100'.  Pt left in w/c with alarm set, family present, needs at hand.  Pain:  no c/o pain  Therapy/Group: Individual Therapy  DONAWERTH,KAREN 07/03/2018, 9:27 AM

## 2018-07-04 ENCOUNTER — Inpatient Hospital Stay (HOSPITAL_COMMUNITY): Payer: Medicare Other | Admitting: Speech Pathology

## 2018-07-04 ENCOUNTER — Inpatient Hospital Stay (HOSPITAL_COMMUNITY): Payer: Medicare Other | Admitting: Occupational Therapy

## 2018-07-04 ENCOUNTER — Inpatient Hospital Stay (HOSPITAL_COMMUNITY): Payer: Medicare Other | Admitting: Physical Therapy

## 2018-07-04 LAB — CBC
HCT: 31.7 % — ABNORMAL LOW (ref 39.0–52.0)
Hemoglobin: 10.2 g/dL — ABNORMAL LOW (ref 13.0–17.0)
MCH: 30.3 pg (ref 26.0–34.0)
MCHC: 32.2 g/dL (ref 30.0–36.0)
MCV: 94.1 fL (ref 78.0–100.0)
PLATELETS: 150 10*3/uL (ref 150–400)
RBC: 3.37 MIL/uL — ABNORMAL LOW (ref 4.22–5.81)
RDW: 14.2 % (ref 11.5–15.5)
WBC: 6.3 10*3/uL (ref 4.0–10.5)

## 2018-07-04 LAB — BASIC METABOLIC PANEL
Anion gap: 7 (ref 5–15)
BUN: 23 mg/dL (ref 8–23)
CALCIUM: 9.1 mg/dL (ref 8.9–10.3)
CO2: 22 mmol/L (ref 22–32)
Chloride: 108 mmol/L (ref 98–111)
Creatinine, Ser: 1.66 mg/dL — ABNORMAL HIGH (ref 0.61–1.24)
GFR calc Af Amer: 44 mL/min — ABNORMAL LOW (ref >60)
GFR calc non Af Amer: 38 mL/min — ABNORMAL LOW (ref >60)
GLUCOSE: 84 mg/dL (ref 70–99)
Potassium: 4.4 mmol/L (ref 3.5–5.1)
Sodium: 137 mmol/L (ref 135–145)

## 2018-07-04 LAB — GLUCOSE, CAPILLARY
GLUCOSE-CAPILLARY: 85 mg/dL (ref 70–99)
Glucose-Capillary: 70 mg/dL (ref 70–99)
Glucose-Capillary: 104 mg/dL — ABNORMAL HIGH (ref 70–99)
Glucose-Capillary: 83 mg/dL (ref 70–99)

## 2018-07-04 NOTE — Progress Notes (Signed)
Speech Language Pathology Daily Session Note  Patient Details  Name: Mark Montoya MRN: [id number] Date of Birth: 09-04-1941  Today's Date: 07/04/2018 SLP Individual Time: 0900-0955 SLP Individual Time Calculation (min): 55 min  Short Term Goals: Week 3: SLP Short Term Goal 1 (Week 3): Patient will consume current diet with minimal overt s/s of aspiration with supervision verbal cues for use of swallowing strategies.  SLP Short Term Goal 2 (Week 3): Patient will demonstrate sustained attention to functional tasks for 10 minutes with Min A verbal cues for redirection.  SLP Short Term Goal 3 (Week 3): Patient will attend to right field of enviornment/body with functional and familiar tasks with Min A verbal cues.  SLP Short Term Goal 4 (Week 3): Patient will follow basic commands with functional and familiar tasks with Min A multimodal cues.  SLP Short Term Goal 5 (Week 3): Patient will self-monitor and correct verbal errors at the word level with Max A multimodal cues.   Skilled Therapeutic Interventions: Skilled treatment session focused on cognitive-linguistic goals. SLP facilitated session by providing Max-Total A multimodal cues for patient to name functional items at the word level. Patient with perseveration and neologisms throughout task without awareness of errors. However, patient able to identify functional items from a field of 2 with 100% accuracy. Patient continues to demonstrate increased lethargy with sessions which impact his attention and overall function with tasks. Patient requested to use the bathroom and required extra time and total A for problem solving while sitting EOB and performing sit to stand via the RW. Patient left on commode with NT present. Continue with current plan of care.      Function:  Cognition Comprehension Comprehension assist level: Understands basic 50 - 74% of the time/ requires cueing 25 - 49% of the time  Expression   Expression assist level:  Expresses basic 50 - 74% of the time/requires cueing 25 - 49% of the time. Needs to repeat parts of sentences.  Social Interaction Social Interaction assist level: Interacts appropriately 25 - 49% of time - Needs frequent redirection.  Problem Solving Problem solving assist level: Solves basic 25 - 49% of the time - needs direction more than half the time to initiate, plan or complete simple activities  Memory Memory assist level: Recognizes or recalls 25 - 49% of the time/requires cueing 50 - 75% of the time    Pain Pain Assessment Pain Scale: 0-10 Pain Score: 0-No pain Faces Pain Scale: No hurt  Therapy/Group: Individual Therapy  Zilphia Kozinski 07/04/2018, 11:35 AM

## 2018-07-04 NOTE — Progress Notes (Signed)
Occupational Therapy Session Note  Patient Details  Name: Mark Montoya MRN: [id number] Date of Birth: 19-Dec-1940  Today's Date: 07/04/2018 OT Individual Time: 1105-1200 OT Individual Time Calculation (min): 55 min   Short Term Goals: Week 3:  OT Short Term Goal 1 (Week 3): STG=LTG 2.2 ELOS  Skilled Therapeutic Interventions/Progress Updates:    Pt awake in bed upon OT arrival. Pt required max multimodal cues and 15 mins to initiate sitting EOB today. Pt would start to move LEs, then stop and say he was tired. Eventually able to get pt into sitting. Pt then needed way more than reasonable amount of time to transfer to the wc, but only CGA to complete transfer once he initiated it. Pt brought to the sink in wc for bathing/dressing. Pt continued to need max multimodal cues for sequencing, initiation, and completion of task. Pt stated " I don't know whats wrong with me" and "I think I just need some sleep." Speech less intelligible today as well. Pt left seated in wc at end of session with alarm belt on and needs met.   Therapy Documentation Precautions:  Precautions Precautions: Fall Restrictions Weight Bearing Restrictions: No Pain: Pain Assessment Pain Scale: 0-10 Pain Score: 0-No pain Faces Pain Scale: No hurt  See Function Navigator for Current Functional Status.  Therapy/Group: Individual Therapy  Valma Cava 07/04/2018, 11:43 AM

## 2018-07-04 NOTE — Progress Notes (Signed)
Occupational Therapy Session Note  Patient Details  Name: Mark Montoya MRN: [id number] Date of Birth: November 09, 1941  Today's Date: 07/04/2018 OT Individual Time: 1400-1431 OT Individual Time Calculation (min): 31 min   Short Term Goals: Week 2:  OT Short Term Goal 1 (Week 2): Pt will complete 1/3 toileting steps with touching assist OT Short Term Goal 1 - Progress (Week 2): Met OT Short Term Goal 2 (Week 2): Pt will complete LB dressing with no more than min A OT Short Term Goal 2 - Progress (Week 2): Met OT Short Term Goal 3 (Week 2): Pt will complete toilet transfer with consistent min A OT Short Term Goal 3 - Progress (Week 2): Met  Skilled Therapeutic Interventions/Progress Updates:    Pt greeted in w/c with RN present to administer medication. Agreeable to tx with encouragement. Tx focus on functional transfers, balance, and cognitive remediation during functional tasks. Started session with oral care completion w/c level at sink. After he completed stand pivot transfer to toilet with steady assist. Continent B+B. He completed clothing mgt x2 with balance assist, and therapist assisted with perihygiene to ensure thoroughness. Throughout tx, he required step by step cues for sequencing, and cues for initiation and safety awareness. At end of session pt was left in w/c with all needs within reach and safety belt fastened.   Therapy Documentation Precautions:  Precautions Precautions: Fall Restrictions Weight Bearing Restrictions: No General PT Missed Treatment Reason: Patient fatigue Pain: No s/s pain during tx    ADL:      See Function Navigator for Current Functional Status.   Therapy/Group: Individual Therapy  Youcef Klas A Olyver Hawes 07/04/2018, 4:07 PM

## 2018-07-04 NOTE — Progress Notes (Signed)
[place] PHYSICAL MEDICINE & REHABILITATION     PROGRESS NOTE    Subjective/Complaints: Pt awake, in good spirits. Denies pain. RN states he slept well  ROS: Patient denies fever, rash, sore throat, blurred vision, nausea, vomiting, diarrhea, cough, shortness of breath or chest pain, joint or back pain, headache, or mood change.    Objective:  No results found. Recent Labs    07/04/18 0605  WBC 6.3  HGB 10.2*  HCT 31.7*  PLT 150   Recent Labs    07/04/18 0605  NA 137  K 4.4  CL 108  GLUCOSE 84  BUN 23  CREATININE 1.66*  CALCIUM 9.1   CBG (last 3)  Recent Labs    07/03/18 1756 07/03/18 2148 07/04/18 0654  GLUCAP 88 80 85    Wt Readings from Last 3 Encounters:  06/25/18 80 kg  06/13/18 78.5 kg  05/19/17 79.4 kg     Intake/Output Summary (Last 24 hours) at 07/04/2018 1046 Last data filed at 07/04/2018 0747 Gross per 24 hour  Intake 340 ml  Output -  Net 340 ml    Vital Signs: Blood pressure (!) 153/77, pulse 63, temperature 98.4 F (36.9 C), temperature source Oral, resp. rate 17, height 5\' 7"  (1.702 m), weight 80 kg, SpO2 95 %. Physical Exam:  Constitutional: No distress . Vital signs reviewed. HEENT: EOMI, oral membranes moist Neck: supple Cardiovascular: RRR without murmur. No JVD    Respiratory: CTA Bilaterally without wheezes or rales. Normal effort    GI: BS +, non-tender, non-distended   Musculoskeletal:  No edema or tenderness in extremities  Neurological: He is more alert.  Expressive aphasia.  Following commands more consistently. Speech continues to improve Right neglect perhaps decreased Skin: Scalp wound with scab, healed--staples.  Psychiatric: cooperative    Assessment/Plan: 1. Functional deficits secondary to TBI which require 3+ hours per day of interdisciplinary therapy in a comprehensive inpatient rehab setting. Physiatrist is providing close team supervision and 24 hour management of active medical problems listed  below. Physiatrist and rehab team continue to assess barriers to discharge/monitor patient progress toward functional and medical goals.  Function:  Bathing Bathing position   Position: Wheelchair/chair at sink  Bathing parts Body parts bathed by patient: Right upper leg, Left upper leg, Front perineal area, Right arm, Left arm, Chest, Abdomen, Right lower leg, Left lower leg, Buttocks Body parts bathed by helper: Back  Bathing assist Assist Level: Touching or steadying assistance(Pt > 75%)      Upper Body Dressing/Undressing Upper body dressing   What is the patient wearing?: Pull over shirt/dress     Pull over shirt/dress - Perfomed by patient: Thread/unthread right sleeve, Thread/unthread left sleeve, Put head through opening, Pull shirt over trunk Pull over shirt/dress - Perfomed by helper: Pull shirt over trunk        Upper body assist Assist Level: Supervision or verbal cues, Set up, More than reasonable time      Lower Body Dressing/Undressing Lower body dressing   What is the patient wearing?: Pants, Non-skid slipper socks     Pants- Performed by patient: Thread/unthread right pants leg, Thread/unthread left pants leg, Pull pants up/down Pants- Performed by helper: Thread/unthread right pants leg, Thread/unthread left pants leg, Fasten/unfasten pants Non-skid slipper socks- Performed by patient: Don/doff right sock, Don/doff left sock Non-skid slipper socks- Performed by helper: Don/doff right sock, Don/doff left sock               TED Hose - Performed  by helper: Don/doff right TED hose, Don/doff left TED hose  Lower body assist Assist for lower body dressing: Touching or steadying assistance (Pt > 75%)      Toileting Toileting Toileting activity did not occur: No continent bowel/bladder event Toileting steps completed by patient: Adjust clothing prior to toileting Toileting steps completed by helper: Performs perineal hygiene, Adjust clothing after  toileting    Toileting assist Assist level: Touching or steadying assistance (Pt.75%)   Transfers Chair/bed transfer Chair/bed transfer activity did not occur: Safety/medical concerns Chair/bed transfer method: Ambulatory Chair/bed transfer assist level: Touching or steadying assistance (Pt > 75%) Chair/bed transfer assistive device: Armrests, Patent attorneyWalker     Locomotion Ambulation     Max distance: 12060ft Assist level: Touching or steadying assistance (Pt > 75%)   Wheelchair   Type: Manual Max wheelchair distance: 100' Assist Level: Moderate assistance (Pt 50 - 74%)  Cognition Comprehension Comprehension assist level: Understands basic 50 - 74% of the time/ requires cueing 25 - 49% of the time  Expression Expression assist level: Expresses basic 50 - 74% of the time/requires cueing 25 - 49% of the time. Needs to repeat parts of sentences.  Social Interaction Social Interaction assist level: Interacts appropriately 25 - 49% of time - Needs frequent redirection.  Problem Solving Problem solving assist level: Solves basic 25 - 49% of the time - needs direction more than half the time to initiate, plan or complete simple activities  Memory Memory assist level: Recognizes or recalls 25 - 49% of the time/requires cueing 50 - 75% of the time   Medical Problem List and Plan: 1.  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  DVT Prophylaxis/Anticoagulation: Subcutaneous heparin initiated 06/17/2018.  Monitor for any bleeding episodes 3. Pain Management: Tylenol as needed 4. Mood: Aricept 10 mg nightly, Xanax 0.125 mg nightly, Zoloft 100 mg daily, Seroquel 12.5 mg twice daily 5. Neuropsych: This patient is not capable of making decisions on his own behalf. 6. Skin/Wound Care: Routine skin checks  - scalp wound healing     7. Fluids/Electrolytes/Nutrition: encourage PO     \ -BUN improved to 23/1.59 8/12---> stable 8/16  23/1.66  -now on thin liquids  -dc IVF    -added megace foe appetite.  Intake picking up (40-65%) 8.  Dysphasia.  Dysphasia #1 -advanced to thins     9.  Hypertension.  Clonidine 0.3 mg TID, hydralazine 50 mg every 8 hours, Norvasc 10 mg daily.   -good control 8/16 10.  Diabetes mellitus.  Hemoglobin A1c 7.0.  Glucotrol 5 mg daily, Glucophage 1000 mg twice daily.    -continue glucotrol 5mg  bid now---    -cover with SSI as intake improves 11.  CKD stage III. Cr 1.59   12.  BPH.  Flomax 0.8 mg daily.    13.  CAD with CABG 2003.  Aspirin currently on hold due to left temporal lobe hemorrhage.  No chest pain or shortness of breath 13. ABLA:     -stool OB negative, no signs of blood loss   -hgb 9.5 8/9---> 10.2 8/16   -continue to monitor  14. Fever:    -afebrile, wbc's normal  -LLL pneumonia, s/p 1 week of abx---now off abx           LOS (Days) 16 A FACE TO FACE EVALUATION WAS PERFORMED  Ranelle OysterZachary T Swartz, MD 07/04/2018 10:46 AM

## 2018-07-04 NOTE — Progress Notes (Signed)
Physical Therapy Session Note  Patient Details  Name: Elgie Collardarl F Chernick MRN: [id number] Date of Birth: 12/01/1940  Today's Date: 07/04/2018 PT Individual Time: 1300-1340 PT Individual Time Calculation (min): 40 min   Short Term Goals: Week 3:  PT Short Term Goal 1 (Week 3): = LTG  Skilled Therapeutic Interventions/Progress Updates:    pt supine in bed, awake. Pt requires max cuing, max encouragement and increased time for supine to sit as pt keeps laying back down.  Pt continues to state "I just don't feel right".  Pt able to cross legs and then requires total A to don shoes.  Pt able to perform stand pivot transfer with increased time and min A.  W/c mobility with max verbal and visual cues 2 x 50' with pt able to problem solve obstacle negotiation in 50% of trials.  Stair negotiation with min A with bilat handrails x 8 stairs.  Pt then states "I can't do any more".  PT encourages pt to propel w/c or perform gait back to room, pt closes eyes and keeps eyes closed despite cuing.  Pt left in w/c with alarm set, needs at hand.  Therapy Documentation Precautions:  Precautions Precautions: Fall Restrictions Weight Bearing Restrictions: No General: PT Amount of Missed Time (min): 20 Minutes PT Missed Treatment Reason: Patient fatigue Pain:  no c/o pain   Therapy/Group: Individual Therapy  Sahej Hauswirth 07/04/2018, 1:41 PM

## 2018-07-04 NOTE — Progress Notes (Signed)
Patient restful throughout shift with no complaints, respiration unlabored on room aor. Sleep chart in place and patient show no sifns of discomfort or distress. Tele monitoring remain in place ,IVF infusing,

## 2018-07-05 ENCOUNTER — Inpatient Hospital Stay (HOSPITAL_COMMUNITY): Payer: Medicare Other | Admitting: Physical Therapy

## 2018-07-05 DIAGNOSIS — R269 Unspecified abnormalities of gait and mobility: Secondary | ICD-10-CM

## 2018-07-05 DIAGNOSIS — S065X9A Traumatic subdural hemorrhage with loss of consciousness of unspecified duration, initial encounter: Secondary | ICD-10-CM

## 2018-07-05 LAB — GLUCOSE, CAPILLARY
GLUCOSE-CAPILLARY: 107 mg/dL — AB (ref 70–99)
GLUCOSE-CAPILLARY: 154 mg/dL — AB (ref 70–99)
GLUCOSE-CAPILLARY: 92 mg/dL (ref 70–99)
GLUCOSE-CAPILLARY: 148 mg/dL — AB (ref 70–99)

## 2018-07-05 NOTE — Progress Notes (Signed)
Physical Therapy Session Note  Patient Details  Name: Mark Montoya MRN: [id number] Date of Birth: [birth year]  Today's Date: 07/05/2018 PT Individual Time: 0930-1000 PT Individual Time Calculation (min): 30 min   Short Term Goals: Week 1:  PT Short Term Goal 1 (Week 1): Pt will consistenty perform bed mobility with mod assist consistently  PT Short Term Goal 1 - Progress (Week 1): Met PT Short Term Goal 2 (Week 1): Pt will transfer to [facility] with mod assist consistently  PT Short Term Goal 2 - Progress (Week 1): Met PT Short Term Goal 3 (Week 1): Pt will ambulate >133f with mod assist and LRAD  PT Short Term Goal 3 - Progress (Week 1): Progressing toward goal PT Short Term Goal 4 (Week 1): pt will maintain standing balance with min assist x 2 min  PT Short Term Goal 4 - Progress (Week 1): Met  Skilled Therapeutic Interventions/Progress Updates:  Pt was seen bedside in the am with nurse completing bath and dressing change. Pt transferred supine to edge of bed with side rail and min A with verbal cues. Pt performed sit to stand and stand pivot transfers with min A and verbal cues. Pt transported to gym. Pt ambulated with rolling walker and min guard to min A, for 75 feet x 2 with verbal cues. Pt returned to room and left sitting up in w/c with quick release belt in place and call bell within reach.  Therapy Documentation Precautions:  Precautions Precautions: Fall Restrictions Weight Bearing Restrictions: No General:   Pain: No c/o pain.   See Function Navigator for Current Functional Status.   Therapy/Group: Individual Therapy  MDub Amis8/17/2019, 12:13 PM

## 2018-07-05 NOTE — Progress Notes (Signed)
[place] PHYSICAL MEDICINE & REHABILITATION     PROGRESS NOTE    Subjective/Complaints:  Pt oriented to person and place  ROS: Patient denies fever, rash, sore throat, blurred vision, nausea, vomiting, diarrhea, cough, shortness of breath or chest pain, joint or back pain, headache, or mood change.    Objective:  No results found. Recent Labs    07/04/18 0605  WBC 6.3  HGB 10.2*  HCT 31.7*  PLT 150   Recent Labs    07/04/18 0605  NA 137  K 4.4  CL 108  GLUCOSE 84  BUN 23  CREATININE 1.66*  CALCIUM 9.1   CBG (last 3)  Recent Labs    07/04/18 1645 07/04/18 2129 07/05/18 0658  GLUCAP 104* 83 107*    Wt Readings from Last 3 Encounters:  06/25/18 80 kg  06/13/18 78.5 kg  05/19/17 79.4 kg     Intake/Output Summary (Last 24 hours) at 07/05/2018 0925 Last data filed at 07/05/2018 [id number] Gross per 24 hour  Intake 460 ml  Output -  Net 460 ml    Vital Signs: Blood pressure (!) 143/71, pulse (!) 59, temperature 97.7 F (36.5 C), temperature source Oral, resp. rate 20, height 5\' 7"  (1.702 m), weight 80 kg, SpO2 96 %. Physical Exam:  Constitutional: No distress . Vital signs reviewed. HEENT: EOMI, oral membranes moist Neck: supple Cardiovascular: RRR without murmur. No JVD    Respiratory: CTA Bilaterally without wheezes or rales. Normal effort    GI: BS +, non-tender, non-distended   Musculoskeletal:  No edema or tenderness in extremities  Neurological: He is more alert.  Expressive aphasia.  Following commands more consistently. Speech continues to improve Right neglect perhaps decreased Skin: Scalp wound with scab, healed--staples.  Psychiatric: cooperative    Assessment/Plan: 1. Functional deficits secondary to TBI which require 3+ hours per day of interdisciplinary therapy in a comprehensive inpatient rehab setting. Physiatrist is providing close team supervision and 24 hour management of active medical problems listed below. Physiatrist and rehab  team continue to assess barriers to discharge/monitor patient progress toward functional and medical goals.  Function:  Bathing Bathing position   Position: Wheelchair/chair at sink  Bathing parts Body parts bathed by patient: Right upper leg, Left upper leg, Front perineal area, Right arm, Left arm, Chest, Abdomen, Right lower leg, Left lower leg, Buttocks Body parts bathed by helper: Back  Bathing assist Assist Level: Touching or steadying assistance(Pt > 75%)      Upper Body Dressing/Undressing Upper body dressing   What is the patient wearing?: Pull over shirt/dress     Pull over shirt/dress - Perfomed by patient: Thread/unthread right sleeve, Thread/unthread left sleeve, Put head through opening, Pull shirt over trunk Pull over shirt/dress - Perfomed by helper: Pull shirt over trunk        Upper body assist Assist Level: Supervision or verbal cues, Set up, More than reasonable time      Lower Body Dressing/Undressing Lower body dressing   What is the patient wearing?: Pants, Non-skid slipper socks     Pants- Performed by patient: Thread/unthread right pants leg, Thread/unthread left pants leg, Pull pants up/down Pants- Performed by helper: Thread/unthread right pants leg, Thread/unthread left pants leg, Fasten/unfasten pants Non-skid slipper socks- Performed by patient: Don/doff right sock, Don/doff left sock Non-skid slipper socks- Performed by helper: Don/doff right sock, Don/doff left sock               TED Hose - Performed by helper: Don/doff right  TED hose, Don/doff left TED hose  Lower body assist Assist for lower body dressing: Touching or steadying assistance (Pt > 75%)      Toileting Toileting Toileting activity did not occur: No continent bowel/bladder event Toileting steps completed by patient: Adjust clothing prior to toileting, Adjust clothing after toileting Toileting steps completed by helper: Performs perineal hygiene    Toileting assist Assist  level: Touching or steadying assistance (Pt.75%)   Transfers Chair/bed transfer Chair/bed transfer activity did not occur: Safety/medical concerns Chair/bed transfer method: Ambulatory Chair/bed transfer assist level: Touching or steadying assistance (Pt > 75%) Chair/bed transfer assistive device: Armrests, Patent attorneyWalker     Locomotion Ambulation     Max distance: [id number] Assist level: Touching or steadying assistance (Pt > 75%)   Wheelchair   Type: Manual Max wheelchair distance: 100' Assist Level: Moderate assistance (Pt 50 - 74%)  Cognition Comprehension Comprehension assist level: Understands basic 50 - 74% of the time/ requires cueing 25 - 49% of the time  Expression Expression assist level: Expresses basic 50 - 74% of the time/requires cueing 25 - 49% of the time. Needs to repeat parts of sentences.  Social Interaction Social Interaction assist level: Interacts appropriately 25 - 49% of time - Needs frequent redirection.  Problem Solving Problem solving assist level: Solves basic 25 - 49% of the time - needs direction more than half the time to initiate, plan or complete simple activities  Memory Memory assist level: Recognizes or recalls 25 - 49% of the time/requires cueing 50 - 75% of the time   Medical Problem List and Plan: 1.  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  DVT Prophylaxis/Anticoagulation: Subcutaneous heparin initiated 06/17/2018.  Monitor for any bleeding episodes 3. Pain Management: Tylenol as needed 4. Mood: Aricept 10 mg nightly, Xanax 0.125 mg nightly, Zoloft 100 mg daily, Seroquel 12.5 mg twice daily 5. Neuropsych: This patient is not capable of making decisions on his own behalf. 6. Skin/Wound Care: Routine skin checks  - scalp wound healing     7. Fluids/Electrolytes/Nutrition: encourage PO     \ -BUN improved to 23/1.59 8/12---> stable 8/16 23/1.66  -now on thin liquids  -dc IVF    -added  megace for appetite.  Intake picking up (40-65%) 8.  Dysphasia.  Dysphasia #1 -advanced to thins     9.  Hypertension.  Clonidine 0.3 mg TID, hydralazine 50 mg every 8 hours, Norvasc 10 mg daily.    Vitals:   07/04/18 2005 07/05/18 0550  BP: 130/73 (!) 143/71  Pulse: 63 (!) 59  Resp: 16 20  Temp: 97.7 F (36.5 C)   SpO2: 95% 96%   10.  Diabetes mellitus.  Hemoglobin A1c 7.0.  Glucotrol 5 mg daily, Glucophage 1000 mg twice daily.    -continue glucotrol 5mg  bid now---    CBG (last 3)  Recent Labs    07/04/18 1645 07/04/18 2129 07/05/18 0658  GLUCAP 104* 83 107*  Controlled 8/17 11.  CKD stage III. Cr 1.59   12.  BPH.  Flomax 0.8 mg daily.    13.  CAD with CABG 2003.  Aspirin currently on hold due to left temporal lobe hemorrhage.  No chest pain or shortness of breath 13. ABLA:     -stool OB negative, no signs of blood loss   -hgb 9.5 8/9---> 10.2 8/16   -continue to monitor  14. Fever:    resolved  LOS (Days) 17 A FACE TO FACE EVALUATION WAS PERFORMED  Erick Colace, MD 07/05/2018 9:25 AM

## 2018-07-06 ENCOUNTER — Inpatient Hospital Stay (HOSPITAL_COMMUNITY): Payer: Medicare Other | Admitting: Occupational Therapy

## 2018-07-06 DIAGNOSIS — S062X9S Diffuse traumatic brain injury with loss of consciousness of unspecified duration, sequela: Secondary | ICD-10-CM

## 2018-07-06 LAB — GLUCOSE, CAPILLARY
GLUCOSE-CAPILLARY: 121 mg/dL — AB (ref 70–99)
GLUCOSE-CAPILLARY: 70 mg/dL (ref 70–99)
Glucose-Capillary: 90 mg/dL (ref 70–99)
Glucose-Capillary: 158 mg/dL — ABNORMAL HIGH (ref 70–99)

## 2018-07-06 MED ORDER — DOCUSATE SODIUM 100 MG PO CAPS
100.0000 mg | ORAL_CAPSULE | Freq: Every day | ORAL | Status: DC | PRN
  Administered 2018-07-06: 100 mg via ORAL
  Filled 2018-07-06: qty 1

## 2018-07-06 NOTE — Progress Notes (Signed)
Occupational Therapy Session Note  Patient Details  Name: Mark Montoya F Buday MRN: [id number] Date of Birth: [birth year]  Today's Date: 07/06/2018 OT Individual Time: 1019-1050 OT Individual Time Calculation (min): 31 min   Short Term Goals: Week 3:  OT Short Term Goal 1 (Week 3): STG=LTG 2.2 ELOS  Skilled Therapeutic Interventions/Progress Updates:    Pt greeted supine in bed. With encouragement, agreeable to OOB activity. Started tx with toilet transfer at ambulatory level using RW. With increased time, pt unable to void B+B. He proceeded to dress sit<stand from toilet, focusing on standing balance, initiation, and sequencing of tasks as well as DME safety. At end of session pt was taken to dance group, and later returned to room by RN.   Therapy Documentation Precautions:  Precautions Precautions: Fall Restrictions Weight Bearing Restrictions: No Pain: No c/o pain during tx    ADL:      See Function Navigator for Current Functional Status.   Therapy/Group: Individual Therapy  Baptiste Littler A Tamas Suen 07/06/2018, 12:23 PM

## 2018-07-06 NOTE — Plan of Care (Signed)
PRN colace received. No changes to report. Continues to offer fluids and keep skin dry.

## 2018-07-06 NOTE — Progress Notes (Signed)
[place] PHYSICAL MEDICINE & REHABILITATION     PROGRESS NOTE    Subjective/Complaints:  Pt orietned to self only Per RN last BM 8/15  ROS: Patient denies, nausea, vomiting, diarrhea, cough, shortness of breath or chest pain, joint or    Objective:  No results found. Recent Labs    07/04/18 0605  WBC 6.3  HGB 10.2*  HCT 31.7*  PLT 150   Recent Labs    07/04/18 0605  NA 137  K 4.4  CL 108  GLUCOSE 84  BUN 23  CREATININE 1.66*  CALCIUM 9.1   CBG (last 3)  Recent Labs    07/05/18 1656 07/05/18 2137 07/06/18 0714  GLUCAP 92 148* 90    Wt Readings from Last 3 Encounters:  06/25/18 80 kg  06/13/18 78.5 kg  05/19/17 79.4 kg     Intake/Output Summary (Last 24 hours) at 07/06/2018 0946 Last data filed at 07/06/2018 0839 Gross per 24 hour  Intake 600 ml  Output -  Net 600 ml    Vital Signs: Blood pressure (!) 143/68, pulse 60, temperature 99.1 F (37.3 C), temperature source Oral, resp. rate 16, height 5\' 7"  (1.702 m), weight 80 kg, SpO2 96 %. Physical Exam:  Constitutional: No distress . Vital signs reviewed. HEENT: EOMI, oral membranes moist Neck: supple Cardiovascular: RRR without murmur. No JVD    Respiratory: CTA Bilaterally without wheezes or rales. Normal effort    GI: BS +, non-tender, non-distended   Musculoskeletal:  No edema or tenderness in extremities  Neurological: He is more alert.  Expressive aphasia.  Following commands more consistently. Speech continues to improve Right neglect perhaps decreased Skin: Scalp wound with scab, healed--staples.  Psychiatric: cooperative    Assessment/Plan: 1. Functional deficits secondary to TBI which require 3+ hours per day of interdisciplinary therapy in a comprehensive inpatient rehab setting. Physiatrist is providing close team supervision and 24 hour management of active medical problems listed below. Physiatrist and rehab team continue to assess barriers to discharge/monitor patient  progress toward functional and medical goals.  Function:  Bathing Bathing position   Position: Wheelchair/chair at sink  Bathing parts Body parts bathed by patient: Right upper leg, Left upper leg, Front perineal area, Right arm, Left arm, Chest, Abdomen, Right lower leg, Left lower leg, Buttocks Body parts bathed by helper: Back  Bathing assist Assist Level: Touching or steadying assistance(Pt > 75%)      Upper Body Dressing/Undressing Upper body dressing   What is the patient wearing?: Pull over shirt/dress     Pull over shirt/dress - Perfomed by patient: Thread/unthread right sleeve, Thread/unthread left sleeve, Put head through opening, Pull shirt over trunk Pull over shirt/dress - Perfomed by helper: Pull shirt over trunk        Upper body assist Assist Level: Supervision or verbal cues, Set up, More than reasonable time      Lower Body Dressing/Undressing Lower body dressing   What is the patient wearing?: Pants, Non-skid slipper socks     Pants- Performed by patient: Thread/unthread right pants leg, Thread/unthread left pants leg, Pull pants up/down Pants- Performed by helper: Thread/unthread right pants leg, Thread/unthread left pants leg, Fasten/unfasten pants Non-skid slipper socks- Performed by patient: Don/doff right sock, Don/doff left sock Non-skid slipper socks- Performed by helper: Don/doff right sock, Don/doff left sock               TED Hose - Performed by helper: Don/doff right TED hose, Don/doff left TED hose  Lower body  assist Assist for lower body dressing: Touching or steadying assistance (Pt > 75%)      Toileting Toileting Toileting activity did not occur: No continent bowel/bladder event Toileting steps completed by patient: Adjust clothing prior to toileting, Adjust clothing after toileting Toileting steps completed by helper: Performs perineal hygiene    Toileting assist Assist level: Touching or steadying assistance (Pt.75%)    Transfers Chair/bed transfer Chair/bed transfer activity did not occur: Safety/medical concerns Chair/bed transfer method: Ambulatory Chair/bed transfer assist level: Touching or steadying assistance (Pt > 75%) Chair/bed transfer assistive device: Armrests, Patent attorney     Max distance: 75 Assist level: Touching or steadying assistance (Pt > 75%)   Wheelchair   Type: Manual Max wheelchair distance: 100' Assist Level: Moderate assistance (Pt 50 - 74%)  Cognition Comprehension Comprehension assist level: Understands basic 50 - 74% of the time/ requires cueing 25 - 49% of the time  Expression Expression assist level: Expresses basic 50 - 74% of the time/requires cueing 25 - 49% of the time. Needs to repeat parts of sentences.  Social Interaction Social Interaction assist level: Interacts appropriately 25 - 49% of time - Needs frequent redirection.  Problem Solving Problem solving assist level: Solves basic 25 - 49% of the time - needs direction more than half the time to initiate, plan or complete simple activities  Memory Memory assist level: Recognizes or recalls 25 - 49% of the time/requires cueing 50 - 75% of the time   Medical Problem List and Plan: 1.  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  DVT Prophylaxis/Anticoagulation: Subcutaneous heparin initiated 06/17/2018.  Monitor for any bleeding episodes 3. Pain Management: Tylenol as needed 4. Mood: Aricept 10 mg nightly, Xanax 0.125 mg nightly, Zoloft 100 mg daily, Seroquel 12.5 mg twice daily 5. Neuropsych: This patient is not capable of making decisions on his own behalf. 6. Skin/Wound Care: Routine skin checks  - scalp wound healing     7. Fluids/Electrolytes/Nutrition: encourage PO     \ -BUN improved to 23/1.59 8/12---> stable 8/16 23/1.66  -now on thin liquids  -dc IVF    -added megace for appetite.  Intake picking up (40-65%) 8.   Dysphasia.  Dysphasia #1 -advanced to thins     9.  Hypertension.  Clonidine 0.3 mg TID, hydralazine 50 mg every 8 hours, Norvasc 10 mg daily.    Vitals:   07/05/18 2043 07/06/18 0600  BP: (!) 146/66 (!) 143/68  Pulse: 81 60  Resp: 16 16  Temp: 98.4 F (36.9 C) 99.1 F (37.3 C)  SpO2: 98% 96%   10.  Diabetes mellitus.  Hemoglobin A1c 7.0.  Glucotrol 5 mg daily, Glucophage 1000 mg twice daily.    -continue glucotrol 5mg  bid now---    CBG (last 3)  Recent Labs    07/05/18 1656 07/05/18 2137 07/06/18 0714  GLUCAP 92 148* 90  Controlled 8/18 11.  CKD stage III. Cr 1.59   12.  BPH.  Flomax 0.8 mg daily.    13.  CAD with CABG 2003.  Aspirin currently on hold due to left temporal lobe hemorrhage.  No chest pain or shortness of breath 13. ABLA:     -stool OB negative, no signs of blood loss   -hgb 9.5 8/9---> 10.2 8/16   -continue to monitor             LOS (Days) 18 A FACE TO FACE  EVALUATION WAS PERFORMED  Erick ColaceAndrew E Jazir Newey, MD 07/06/2018 9:46 AM

## 2018-07-07 ENCOUNTER — Inpatient Hospital Stay (HOSPITAL_COMMUNITY): Payer: Medicare Other | Admitting: Occupational Therapy

## 2018-07-07 ENCOUNTER — Inpatient Hospital Stay (HOSPITAL_COMMUNITY): Payer: Medicare Other | Admitting: Physical Therapy

## 2018-07-07 ENCOUNTER — Inpatient Hospital Stay (HOSPITAL_COMMUNITY): Payer: Medicare Other | Admitting: Speech Pathology

## 2018-07-07 LAB — GLUCOSE, CAPILLARY
GLUCOSE-CAPILLARY: 87 mg/dL (ref 70–99)
GLUCOSE-CAPILLARY: 91 mg/dL (ref 70–99)
GLUCOSE-CAPILLARY: 112 mg/dL — AB (ref 70–99)
Glucose-Capillary: 89 mg/dL (ref 70–99)

## 2018-07-07 LAB — OCCULT BLOOD X 1 CARD TO LAB, STOOL: Fecal Occult Bld: NEGATIVE

## 2018-07-07 NOTE — Progress Notes (Signed)
Physical Therapy Session Note  Patient Details  Name: Mark Montoya F Schuneman MRN: [id number] Date of Birth: October 16, 1941  Today's Date: 07/07/2018 PT Individual Time: 1415-1500 PT Individual Time Calculation (min): 45 min   Short Term Goals: Week 3:  PT Short Term Goal 1 (Week 3): = LTG  Skilled Therapeutic Interventions/Progress Updates:    pt asleep in bed, aroused to voice.  Pt requires max multimodal cues to sit edge of bed and perform stand pivot transfer with min A.  Pt performs gait with RW 3 x 50' with max cues for attention to task.  Pt performs nustep x 6 mins with mod cuing for attention to task.  Pt requires increased time and cuing to perform all activities this session, more confused requiring max cues for all mobility.  Therapy Documentation Precautions:  Precautions Precautions: Fall Restrictions Weight Bearing Restrictions: No Pain: Pain Assessment Pain Score: 0-No pain Faces Pain Scale: No hurt    Therapy/Group: Individual Therapy  Gustavia Carie 07/07/2018, 3:05 PM

## 2018-07-07 NOTE — Progress Notes (Signed)
Occupational Therapy Session Note  Patient Details  Name: Mark Montoya MRN: [id number] Date of Birth: 04/19/1941  Today's Date: 07/07/2018 OT Individual Time: 1105-1230 OT Individual Time Calculation (min): 85 min   Short Term Goals: Week 3:  OT Short Term Goal 1 (Week 3): STG=LTG 2.2 ELOS  Skilled Therapeutic Interventions/Progress Updates:    Pt greeted semi-reclined in bed and agreeable to OT treatment session. Pt needed max multimodal cues to initiate bed mobility this morning. Often starting to move, but then stopping, requiring cues to continue. Once seated EOB, pt was able to maintain sitting balance with supervision. Pt needed max multimodal cues and increased time to initiate all tasks today. Stand-pivot into shower with CGA, but max cues to initiate. Pt also unable to problem solve doffing shirt today. Bathing completed with supervision and CGA for balance when standing to was buttocks. Pt needed max verbal cues to wash all body parts thoroughly as he perseverated on washing arms only. Pt very easily distracted. Pt able to thread BLEs into pants with multimodal cues, then began tying pants before standing to pull them up. OT cued pt that he had not pulled pants up and needed to do this first, but he remained perseverative on tying pants, requiring max cues and min A to stand. Pt received letter and worked on fine motor skills to open envelope. Pt needed cues to orient card, then had pt try to read card. Graded task to try to have pt read one word at a time, but pt unable to do, so OT read card to patient. Pt left seated in wc at end of session with safety belt on and needs met.  Therapy Documentation Precautions:  Precautions Precautions: Fall Restrictions Weight Bearing Restrictions: No Pain:  none denies pain  See Function Navigator for Current Functional Status.  Therapy/Group: Individual Therapy   S  07/07/2018, 11:36 AM 

## 2018-07-07 NOTE — Progress Notes (Signed)
Speech Language Pathology Daily Session Note  Patient Details  Name: Mark Montoya MRN: [id number] Date of Birth: 1941/04/14  Today's Date: 07/07/2018 SLP Individual Time: 0730-0825 SLP Individual Time Calculation (min): 55 min  Short Term Goals: Week 3: SLP Short Term Goal 1 (Week 3): Patient will consume current diet with minimal overt s/s of aspiration with supervision verbal cues for use of swallowing strategies.  SLP Short Term Goal 2 (Week 3): Patient will demonstrate sustained attention to functional tasks for 10 minutes with Min A verbal cues for redirection.  SLP Short Term Goal 3 (Week 3): Patient will attend to right field of enviornment/body with functional and familiar tasks with Min A verbal cues.  SLP Short Term Goal 4 (Week 3): Patient will follow basic commands with functional and familiar tasks with Min A multimodal cues.  SLP Short Term Goal 5 (Week 3): Patient will self-monitor and correct verbal errors at the word level with Max A multimodal cues.   Skilled Therapeutic Interventions:  Skilled treatment session focused on cognitive and dysphagia goals. SLP facilitated session by providing extra time and Max A instructional cues for patient to sit EOB. Patient consumed meal while sitting EOB with frequent loses of balance that required Max A to self-monitor and correct. Patient with emergent awareness of difficulty with sitting balance today while self-feeding and reported, "what in the shit is wrong with me?" MD aware. Throughout meal, patient required Mod A verbal cues for problem solving and for use of small bites/sips. Patient consumed meal without overt s/s of aspiration, therefore, recommend patient continue current diet. Patient left supine in bed with alarm on and all needs within reach. Continue with current plan of care.       Function:  Eating Eating     Eating Assist Level: Supervision or verbal cues;More than reasonable amount of time;Set up assist for    Eating Set Up Assist For: Opening containers Helper Scoops Food on Utensil: Occasionally Helper Brings Food to Mouth: Occasionally   Cognition Comprehension Comprehension assist level: Understands basic 25 - 49% of the time/ requires cueing 50 - 75% of the time  Expression   Expression assist level: Expresses basic 25 - 49% of the time/requires cueing 50 - 75% of the time. Uses single words/gestures.  Social Interaction Social Interaction assist level: Interacts appropriately 25 - 49% of time - Needs frequent redirection.  Problem Solving Problem solving assist level: Solves basic less than 25% of the time - needs direction nearly all the time or does not effectively solve problems and may need a restraint for safety  Memory Memory assist level: Recognizes or recalls less than 25% of the time/requires cueing greater than 75% of the time    Pain Pain Assessment Pain Score: 0-No pain Faces Pain Scale: No hurt  Therapy/Group: Individual Therapy  Veleka Djordjevic 07/07/2018, 3:29 PM

## 2018-07-07 NOTE — Progress Notes (Signed)
[place] PHYSICAL MEDICINE & REHABILITATION     PROGRESS NOTE    Subjective/Complaints:  Up in bed. Just worked with SLP. SLP reports decreased trunk control. Otherwise no real changes  ROS: Limited due to cognitive/behavioral   Objective:  No results found. No results for input(s): WBC, HGB, HCT, PLT in the last 72 hours. No results for input(s): NA, K, CL, GLUCOSE, BUN, CREATININE, CALCIUM in the last 72 hours.  Invalid input(s): CO CBG (last 3)  Recent Labs    07/06/18 2146 07/07/18 0637 07/07/18 1203  GLUCAP 70 87 91    Wt Readings from Last 3 Encounters:  06/25/18 80 kg  06/13/18 78.5 kg  05/19/17 79.4 kg     Intake/Output Summary (Last 24 hours) at 07/07/2018 1305 Last data filed at 07/07/2018 [id number] Gross per 24 hour  Intake 540 ml  Output 150 ml  Net 390 ml    Vital Signs: Blood pressure (!) 146/74, pulse 65, temperature 98.7 F (37.1 C), resp. rate 17, height 5\' 7"  (1.702 m), weight 80 kg, SpO2 96 %. Physical Exam:  Constitutional: No distress . Vital signs reviewed. HEENT: EOMI, oral membranes moist Neck: supple Cardiovascular: RRR without murmur. No JVD    Respiratory: CTA Bilaterally without wheezes or rales. Normal effort    GI: BS +, non-tender, non-distended   Musculoskeletal:  No edema or tenderness in extremities  Neurological: alert, voice stronger, still aphasic.  consistently. Speech continues to improve Right neglect unchanged Skin: Scalp wound with scab, healed--staples.  Psychiatric: cooperative    Assessment/Plan: 1. Functional deficits secondary to TBI which require 3+ hours per day of interdisciplinary therapy in a comprehensive inpatient rehab setting. Physiatrist is providing close team supervision and 24 hour management of active medical problems listed below. Physiatrist and rehab team continue to assess barriers to discharge/monitor patient progress toward functional and medical goals.  Function:  Bathing Bathing  position   Position: Shower  Bathing parts Body parts bathed by patient: Right upper leg, Left upper leg, Front perineal area, Right arm, Left arm, Chest, Abdomen, Right lower leg, Left lower leg, Buttocks Body parts bathed by helper: Back  Bathing assist Assist Level: Touching or steadying assistance(Pt > 75%)      Upper Body Dressing/Undressing Upper body dressing   What is the patient wearing?: Pull over shirt/dress     Pull over shirt/dress - Perfomed by patient: Thread/unthread right sleeve, Put head through opening, Thread/unthread left sleeve, Pull shirt over trunk Pull over shirt/dress - Perfomed by helper: Pull shirt over trunk        Upper body assist Assist Level: More than reasonable time, Supervision or verbal cues      Lower Body Dressing/Undressing Lower body dressing   What is the patient wearing?: Pants, Non-skid slipper socks     Pants- Performed by patient: Thread/unthread right pants leg, Thread/unthread left pants leg, Pull pants up/down Pants- Performed by helper: Thread/unthread right pants leg, Thread/unthread left pants leg, Fasten/unfasten pants Non-skid slipper socks- Performed by patient: Don/doff right sock, Don/doff left sock Non-skid slipper socks- Performed by helper: Don/doff right sock, Don/doff left sock               TED Hose - Performed by helper: Don/doff right TED hose, Don/doff left TED hose  Lower body assist Assist for lower body dressing: Touching or steadying assistance (Pt > 75%)      Toileting Toileting Toileting activity did not occur: No continent bowel/bladder event Toileting steps completed by patient: Adjust  clothing prior to toileting, Adjust clothing after toileting(no hygiene to complete) Toileting steps completed by helper: Performs perineal hygiene    Toileting assist Assist level: Touching or steadying assistance (Pt.75%)   Transfers Chair/bed transfer Chair/bed transfer activity did not occur: Safety/medical  concerns Chair/bed transfer method: Ambulatory Chair/bed transfer assist level: Touching or steadying assistance (Pt > 75%) Chair/bed transfer assistive device: Armrests, Patent attorneyWalker     Locomotion Ambulation     Max distance: 75 Assist level: Touching or steadying assistance (Pt > 75%)   Wheelchair   Type: Manual Max wheelchair distance: 100' Assist Level: Moderate assistance (Pt 50 - 74%)  Cognition Comprehension Comprehension assist level: Understands basic 50 - 74% of the time/ requires cueing 25 - 49% of the time  Expression Expression assist level: Expresses basic 50 - 74% of the time/requires cueing 25 - 49% of the time. Needs to repeat parts of sentences.  Social Interaction Social Interaction assist level: Interacts appropriately 25 - 49% of time - Needs frequent redirection.  Problem Solving Problem solving assist level: Solves basic 25 - 49% of the time - needs direction more than half the time to initiate, plan or complete simple activities  Memory Memory assist level: Recognizes or recalls 25 - 49% of the time/requires cueing 50 - 75% of the time   Medical Problem List and Plan: 1.  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  DVT Prophylaxis/Anticoagulation: Subcutaneous heparin initiated 06/17/2018.  Monitor for any bleeding episodes 3. Pain Management: Tylenol as needed 4. Mood: Aricept 10 mg nightly, Xanax 0.125 mg nightly, Zoloft 100 mg daily, Seroquel 12.5 mg twice daily 5. Neuropsych: This patient is not capable of making decisions on his own behalf. 6. Skin/Wound Care: Routine skin checks  - scalp wound healing     7. Fluids/Electrolytes/Nutrition: encourage PO     \ -BUN improved to 23/1.59 8/12---> stable 8/16 23/1.66  -now on thin liquids  -off IVF    -added megace for appetite which appears to have helped  -recheck labs tomorrow 8.  Dysphasia.   Dysphasia #1 -advanced to thins     9.  Hypertension.  Clonidine 0.3 mg TID, hydralazine 50 mg every 8 hours, Norvasc 10 mg daily.   bp's reasonable Vitals:   07/06/18 2306 07/07/18 0556  BP: 119/75 (!) 146/74  Pulse: 67 65  Resp:  17  Temp:  98.7 F (37.1 C)  SpO2: 97% 96%   10.  Diabetes mellitus.  Hemoglobin A1c 7.0.  Glucotrol 5 mg daily, Glucophage 1000 mg twice daily.    -continue glucotrol 5mg  bid now---    CBG (last 3)  Recent Labs    07/06/18 2146 07/07/18 0637 07/07/18 1203  GLUCAP 70 87 91  Controlled 8/19 11.  CKD stage III. Cr 1.59   12.  BPH.  Flomax 0.8 mg daily.    13.  CAD with CABG 2003.  Aspirin currently on hold due to left temporal lobe hemorrhage.  No chest pain or shortness of breath 13. ABLA:     -stool OB negative, no signs of blood loss   -hgb 9.5 8/9---> 10.2 8/16   -continue to monitor             LOS (Days) 19 A FACE TO FACE EVALUATION WAS PERFORMED  Ranelle OysterZachary T Trust Leh, MD 07/07/2018 1:05 PM

## 2018-07-08 ENCOUNTER — Inpatient Hospital Stay (HOSPITAL_COMMUNITY): Payer: Medicare Other | Admitting: Physical Therapy

## 2018-07-08 ENCOUNTER — Inpatient Hospital Stay (HOSPITAL_COMMUNITY): Payer: Medicare Other | Admitting: Occupational Therapy

## 2018-07-08 ENCOUNTER — Inpatient Hospital Stay (HOSPITAL_COMMUNITY): Payer: Medicare Other | Admitting: Speech Pathology

## 2018-07-08 LAB — BASIC METABOLIC PANEL
Anion gap: 8 (ref 5–15)
BUN: 27 mg/dL — AB (ref 8–23)
CO2: 20 mmol/L — ABNORMAL LOW (ref 22–32)
Calcium: 9.2 mg/dL (ref 8.9–10.3)
Chloride: 109 mmol/L (ref 98–111)
Creatinine, Ser: 1.83 mg/dL — ABNORMAL HIGH (ref 0.61–1.24)
GFR calc Af Amer: 39 mL/min — ABNORMAL LOW (ref >60)
GFR calc non Af Amer: 34 mL/min — ABNORMAL LOW (ref >60)
Glucose, Bld: 91 mg/dL (ref 70–99)
POTASSIUM: 4.9 mmol/L (ref 3.5–5.1)
Sodium: 137 mmol/L (ref 135–145)

## 2018-07-08 LAB — GLUCOSE, CAPILLARY
GLUCOSE-CAPILLARY: 97 mg/dL (ref 70–99)
GLUCOSE-CAPILLARY: 130 mg/dL — AB (ref 70–99)
Glucose-Capillary: 115 mg/dL — ABNORMAL HIGH (ref 70–99)
Glucose-Capillary: 107 mg/dL — ABNORMAL HIGH (ref 70–99)
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
## 2018-07-08 NOTE — Progress Notes (Signed)
[place] PHYSICAL MEDICINE & REHABILITATION     PROGRESS NOTE    Subjective/Complaints:  Pt sitting up in bed. Stated he slept last night  ROS: limited due to language/communication   Objective:  No results found. No results for input(s): WBC, HGB, HCT, PLT in the last 72 hours. No results for input(s): NA, K, CL, GLUCOSE, BUN, CREATININE, CALCIUM in the last 72 hours.  Invalid input(s): CO CBG (last 3)  Recent Labs    07/07/18 1650 07/07/18 2116 07/08/18 0656  GLUCAP 89 112* 97    Wt Readings from Last 3 Encounters:  06/25/18 80 kg  06/13/18 78.5 kg  05/19/17 79.4 kg     Intake/Output Summary (Last 24 hours) at 07/08/2018 0917 Last data filed at 07/08/2018 0825 Gross per 24 hour  Intake 760 ml  Output -  Net 760 ml    Vital Signs: Blood pressure (!) 145/73, pulse (!) 56, temperature 98 F (36.7 C), temperature source Oral, resp. rate 17, height 5\' 7"  (1.702 m), weight 80 kg, SpO2 96 %. Physical Exam:  Constitutional: No distress . Vital signs reviewed. HEENT: EOMI, oral membranes moist Neck: supple Cardiovascular: RRR without murmur. No JVD    Respiratory: CTA Bilaterally without wheezes or rales. Normal effort    GI: BS +, non-tender, non-distended   Musculoskeletal:  No edema or tenderness in extremities  Neurological: alert, voice stronger, still aphasic.  consistently. Speech continues to improve Right neglect still present but can attend to right Skin: Scalp wound healing  Psychiatric: generally cooperative    Assessment/Plan: 1. Functional deficits secondary to TBI which require 3+ hours per day of interdisciplinary therapy in a comprehensive inpatient rehab setting. Physiatrist is providing close team supervision and 24 hour management of active medical problems listed below. Physiatrist and rehab team continue to assess barriers to discharge/monitor patient progress toward functional and medical goals.  Function:  Bathing Bathing position    Position: Shower  Bathing parts Body parts bathed by patient: Right upper leg, Left upper leg, Front perineal area, Right arm, Left arm, Chest, Abdomen, Right lower leg, Left lower leg, Buttocks Body parts bathed by helper: Back  Bathing assist Assist Level: Touching or steadying assistance(Pt > 75%)      Upper Body Dressing/Undressing Upper body dressing   What is the patient wearing?: Pull over shirt/dress     Pull over shirt/dress - Perfomed by patient: Thread/unthread right sleeve, Put head through opening, Thread/unthread left sleeve, Pull shirt over trunk Pull over shirt/dress - Perfomed by helper: Pull shirt over trunk        Upper body assist Assist Level: More than reasonable time, Supervision or verbal cues      Lower Body Dressing/Undressing Lower body dressing   What is the patient wearing?: Pants, Non-skid slipper socks     Pants- Performed by patient: Thread/unthread right pants leg, Thread/unthread left pants leg, Pull pants up/down Pants- Performed by helper: Thread/unthread right pants leg, Thread/unthread left pants leg, Fasten/unfasten pants Non-skid slipper socks- Performed by patient: Don/doff right sock, Don/doff left sock Non-skid slipper socks- Performed by helper: Don/doff right sock, Don/doff left sock               TED Hose - Performed by helper: Don/doff right TED hose, Don/doff left TED hose  Lower body assist Assist for lower body dressing: Touching or steadying assistance (Pt > 75%)      Toileting Toileting Toileting activity did not occur: No continent bowel/bladder event Toileting steps completed by  patient: Adjust clothing prior to toileting, Adjust clothing after toileting(no hygiene to complete) Toileting steps completed by helper: Performs perineal hygiene    Toileting assist Assist level: Two helpers   Transfers Chair/bed transfer Chair/bed transfer activity did not occur: Safety/medical concerns Chair/bed transfer method:  Ambulatory Chair/bed transfer assist level: Touching or steadying assistance (Pt > 75%) Chair/bed transfer assistive device: Armrests, Patent attorneyWalker     Locomotion Ambulation     Max distance: 75 Assist level: Touching or steadying assistance (Pt > 75%)   Wheelchair   Type: Manual Max wheelchair distance: 100' Assist Level: Moderate assistance (Pt 50 - 74%)  Cognition Comprehension Comprehension assist level: Understands basic 25 - 49% of the time/ requires cueing 50 - 75% of the time  Expression Expression assist level: Expresses basic 25 - 49% of the time/requires cueing 50 - 75% of the time. Uses single words/gestures.  Social Interaction Social Interaction assist level: Interacts appropriately 25 - 49% of time - Needs frequent redirection.  Problem Solving Problem solving assist level: Solves basic less than 25% of the time - needs direction nearly all the time or does not effectively solve problems and may need a restraint for safety  Memory Memory assist level: Recognizes or recalls less than 25% of the time/requires cueing greater than 75% of the time   Medical Problem List and Plan: 1.  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  DVT Prophylaxis/Anticoagulation: Subcutaneous heparin initiated 06/17/2018.  Monitor for any bleeding episodes 3. Pain Management: Tylenol as needed 4. Mood: Aricept 10 mg nightly, Xanax 0.125 mg nightly, Zoloft 100 mg daily, Seroquel 12.5 mg twice daily 5. Neuropsych: This patient is not capable of making decisions on his own behalf. 6. Skin/Wound Care: Routine skin checks  - scalp wound healing     7. Fluids/Electrolytes/Nutrition: encourage PO     \ -BUN improved to 23/1.59 8/12---> stable 8/16 23/1.66  -now on thin liquids  -off IVF    -added megace and eating nearly 100%  -labs ordered for today are pending 8.  Dysphasia.  Dysphasia #1 -advanced to thins      9.  Hypertension.  Clonidine 0.3 mg TID, hydralazine 50 mg every 8 hours, Norvasc 10 mg daily.   bp's reasonable at present Vitals:   07/07/18 2007 07/08/18 0609  BP: 131/67 (!) 145/73  Pulse: 60 (!) 56  Resp: 14 17  Temp: 98 F (36.7 C)   SpO2: 98% 96%   10.  Diabetes mellitus.  Hemoglobin A1c 7.0.  Glucotrol 5 mg daily, Glucophage 1000 mg twice daily.    -continue glucotrol 5mg  bid now CBG (last 3)  Recent Labs    07/07/18 1650 07/07/18 2116 07/08/18 0656  GLUCAP 89 112* 97  Controlled 8/20 11.  CKD stage III. Cr 1.59   12.  BPH.  Flomax 0.8 mg daily.    13.  CAD with CABG 2003.  Aspirin currently on hold due to left temporal lobe hemorrhage.  No chest pain or shortness of breath 13. ABLA:     -stool OB negative, no signs of blood loss   -hgb 9.5 8/9---> 10.2 8/16   -continue to monitor             LOS (Days) 20 A FACE TO FACE EVALUATION WAS PERFORMED  Ranelle OysterZachary T Camellia Popescu, MD 07/08/2018 9:17 AM

## 2018-07-08 NOTE — Progress Notes (Signed)
Social Work Patient ID: [name], male   DOB: 1941/01/22, 77 y.o.   MRN: [id number]  Have reviewed team conf with pt's grandaughter, Mark Montoya, who notes family ready for d/c 8/22.  Discussed plan for hospital bed at home and she is agreeable.  No further questions.  Dalores Weger, LCSW

## 2018-07-08 NOTE — Progress Notes (Signed)
Speech Language Pathology Daily Session Note  Patient Details  Name: Mark Montoya MRN: [id number] Date of Birth: June 07, 1941  Today's Date: 07/08/2018 SLP Individual Time: 0730-0825 SLP Individual Time Calculation (min): 55 min  Short Term Goals: Week 3: SLP Short Term Goal 1 (Week 3): Patient will consume current diet with minimal overt s/s of aspiration with supervision verbal cues for use of swallowing strategies.  SLP Short Term Goal 2 (Week 3): Patient will demonstrate sustained attention to functional tasks for 10 minutes with Min A verbal cues for redirection.  SLP Short Term Goal 3 (Week 3): Patient will attend to right field of enviornment/body with functional and familiar tasks with Min A verbal cues.  SLP Short Term Goal 4 (Week 3): Patient will follow basic commands with functional and familiar tasks with Min A multimodal cues.  SLP Short Term Goal 5 (Week 3): Patient will self-monitor and correct verbal errors at the word level with Max A multimodal cues.   Skilled Therapeutic Interventions: Skilled treatment session focused on dysphagia and cognitive goals. SLP facilitated session by providing skilled observation with breakfast meal of Dys. 1 textures with thin liquids. Patient consumed meal with throat clear X 1 due to large sips of thin liquids via cup and required overall Min A verbal cues for use of swallowing strategies. Recommend patient continue current diet. Patient demonstrated increased sustained attention to tasks and self-fed 100% of his meal with supervision verbal cues for initiation and Min A verbal and visual cues for problem solving. Patient also able to make his needs known at the phrase level. Patient consumed trials of Dys. 2 textures with prolonged mastication and moderate oral residue that cleared with a cued liquid wash. Recommend patient remain on current diet. SLP performed oral care at end of session. Patient left upright in bed with alarm on and all needs  within reach. Continue with current plan of care.      Function:  Eating Eating   Modified Consistency Diet: Yes Eating Assist Level: Supervision or verbal cues;More than reasonable amount of time;Set up assist for   Eating Set Up Assist For: Opening containers;Cutting food       Cognition Comprehension Comprehension assist level: Understands basic 75 - 89% of the time/ requires cueing 10 - 24% of the time  Expression   Expression assist level: Expresses basic 50 - 74% of the time/requires cueing 25 - 49% of the time. Needs to repeat parts of sentences.  Social Interaction Social Interaction assist level: Interacts appropriately 75 - 89% of the time - Needs redirection for appropriate language or to initiate interaction.  Problem Solving Problem solving assist level: Solves basic 25 - 49% of the time - needs direction more than half the time to initiate, plan or complete simple activities  Memory Memory assist level: Recognizes or recalls 25 - 49% of the time/requires cueing 50 - 75% of the time    Pain No/Denies Pain   Therapy/Group: Individual Therapy  Tyffani Foglesong 07/08/2018, 10:17 AM

## 2018-07-08 NOTE — Progress Notes (Signed)
Physical Therapy Session Note  Patient Details  Name: Mark Montoya MRN: [id number] Date of Birth: 12-29-1940  Today's Date: 07/08/2018 PT Individual Time: 1330-1405 PT Individual Time Calculation (min): 35 min   Short Term Goals: Week 3:  PT Short Term Goal 1 (Week 3): = LTG  Skilled Therapeutic Interventions/Progress Updates:   Pt in w/c and agreeable to therapy, no c/o pain. Ambulated to/from therapy gym w/ increased time and min assist overall for balance. Max verbal, tactile, and manual cues to initiate sit>stand to walker, for safety w/ RW management, and for obstacle avoidance. Performed static standing balance task w/o UE support while performing UE reaching tasks to emphasize increased thoracic extension. Min guard for safety w/ occasional verbal cues to attend to task, even in busy environment. Pt reporting he had to toilet and immediately sat back down on mat. Max verbal and tactile cues to initiate stand to RW to ambulate back to room to toilet. Pt incontinent of urine by the time we got back to room. Max cues for toilet transfer and managing RW in bathroom, total assist for brief and LE garment management despite max cues to perform himself. Stood at sink to wash hands w/ min guard. Ended session in supine, call bell within reach and all needs met.   Therapy Documentation Precautions:  Precautions Precautions: Fall Restrictions Weight Bearing Restrictions: No  See Function Navigator for Current Functional Status.   Therapy/Group: Individual Therapy  Mark Montoya 07/08/2018, 2:08 PM

## 2018-07-08 NOTE — Progress Notes (Signed)
Occupational Therapy Session Note  Patient Details  Name: Mark Montoya MRN: [id number] Date of Birth: 1941/01/09  Today's Date: 07/08/2018  Session 1 OT Individual Time: 0915-1030 OT Individual Time Calculation (min): 75 min   Session 2 OT Individual Time: 2103-1281 OT Individual Time Calculation (min): 25 min   Short Term Goals: Week 3:  OT Short Term Goal 1 (Week 3): STG=LTG 2.2 ELOS  Skilled Therapeutic Interventions/Progress Updates:  Session 1   Pt greeted semi-reclined in bed required max multimodal cues and over 10 mins to follow commands to come to sitting EOB. Sit<>stand with RW and verbal/tactile cues for hand placement with sit<>stand-CGA. Pt ambulated into bathroom with RW and CGA initially, then needed intermittent min A to safely negotiate RW. Pt unable to follow commands for turning to sit on commode required min A and facilitation at hips to safely turn and sit on toilet. Pt voided bladder on commode but no BM. Pt declined to shower but agreeable to bathing at the sink. Worked on sequencing, initiation, and sit<>stand within bathing tasks. Pt needed max verbal cues to complete bathing thoroughly and maintain alertness during bathing/dressing. He was able to sequence 2/3 steps to LB dressing and 3/4 steps to UB dressing without verbal cues but increased time. Pt unclear on what to do with electric razor requiring OT to demonstrate use of razor. Pt needed verbal cues to complete task thoroughly and keep his eyes open as he continues to be lethargic today. Oral care completed seated in wc at the sink with verbal cues to locate toothpaste, remove lid, and place toothpaste on toothbrush. Pt left seated in wc at end of session with alarm belt on and needs met. Pain: None denies pain   Session 2 OT treatment session focused on self-feed goals. Positioned pt more upright in wc, then worked on pt opening containers with BUEs, but pt unable to complete successfully. Pt getting  distracted by people walking in hallway, so door closed to limit distractions. Pt able to attend to eating task with only min verbal cues to initiate and get appropriate size bites on utensil. Pt left seated in wc at end of session with safety belt on and needs met.   Therapy Documentation Precautions:  Precautions Precautions: Fall Restrictions Weight Bearing Restrictions: No Pain: None denies pain See Function Navigator for Current Functional Status.  Therapy/Group: Individual Therapy  Valma Cava 07/08/2018, 12:41 PM

## 2018-07-09 ENCOUNTER — Inpatient Hospital Stay (HOSPITAL_COMMUNITY): Payer: Medicare Other | Admitting: Speech Pathology

## 2018-07-09 ENCOUNTER — Inpatient Hospital Stay (HOSPITAL_COMMUNITY): Payer: Medicare Other

## 2018-07-09 ENCOUNTER — Inpatient Hospital Stay (HOSPITAL_COMMUNITY): Payer: Medicare Other | Admitting: Occupational Therapy

## 2018-07-09 LAB — GLUCOSE, CAPILLARY
Glucose-Capillary: 92 mg/dL (ref 70–99)
Glucose-Capillary: 165 mg/dL — ABNORMAL HIGH (ref 70–99)
Glucose-Capillary: 112 mg/dL — ABNORMAL HIGH (ref 70–99)
Glucose-Capillary: 104 mg/dL — ABNORMAL HIGH (ref 70–99)

## 2018-07-09 MED ORDER — PRO-STAT SUGAR FREE PO LIQD
30.0000 mL | Freq: Three times a day (TID) | ORAL | Status: DC
  Administered 2018-07-09 – 2018-07-10 (×4): 30 mL via ORAL
  Filled 2018-07-09 (×3): qty 30

## 2018-07-09 NOTE — Progress Notes (Signed)
[place] PHYSICAL MEDICINE & REHABILITATION     PROGRESS NOTE    Subjective/Complaints:  Pt  Up in bed. No new complaints  ROS: Limited due to cognitive/behavioral    Objective:  No results found. No results for input(s): WBC, HGB, HCT, PLT in the last 72 hours. Recent Labs    07/08/18 0646  NA 137  K 4.9  CL 109  GLUCOSE 91  BUN 27*  CREATININE 1.83*  CALCIUM 9.2   CBG (last 3)  Recent Labs    07/08/18 2142 07/09/18 0650 07/09/18 1152  GLUCAP 107* 92 165*    Wt Readings from Last 3 Encounters:  06/25/18 80 kg  06/13/18 78.5 kg  05/19/17 79.4 kg     Intake/Output Summary (Last 24 hours) at 07/09/2018 1222 Last data filed at 07/09/2018 1218 Gross per 24 hour  Intake 600 ml  Output -  Net 600 ml    Vital Signs: Blood pressure (!) 152/71, pulse 77, temperature 98.3 F (36.8 C), temperature source Oral, resp. rate 18, height 5\' 7"  (1.702 m), weight 80 kg, SpO2 97 %. Physical Exam:  Constitutional: No distress . Vital signs reviewed. HEENT: EOMI, oral membranes moist Neck: supple Cardiovascular: RRR without murmur. No JVD    Respiratory: CTA Bilaterally without wheezes or rales. Normal effort    GI: BS +, non-tender, non-distended   Musculoskeletal: no edema Neurological: alert, still with word finding deficits.  Speech continues to improve Right neglect still present but can attend to right Skin: Scalp wound healing nicely  Psychiatric: generally cooperative    Assessment/Plan: 1. Functional deficits secondary to TBI which require 3+ hours per day of interdisciplinary therapy in a comprehensive inpatient rehab setting. Physiatrist is providing close team supervision and 24 hour management of active medical problems listed below. Physiatrist and rehab team continue to assess barriers to discharge/monitor patient progress toward functional and medical goals.  Function:  Bathing Bathing position   Position: Shower  Bathing parts Body parts  bathed by patient: Right upper leg, Left upper leg, Front perineal area, Right arm, Left arm, Chest, Abdomen, Right lower leg, Left lower leg, Buttocks Body parts bathed by helper: Back  Bathing assist Assist Level: Touching or steadying assistance(Pt > 75%)      Upper Body Dressing/Undressing Upper body dressing   What is the patient wearing?: Pull over shirt/dress     Pull over shirt/dress - Perfomed by patient: Thread/unthread right sleeve, Put head through opening, Thread/unthread left sleeve, Pull shirt over trunk Pull over shirt/dress - Perfomed by helper: Pull shirt over trunk        Upper body assist Assist Level: More than reasonable time, Supervision or verbal cues   Set up : To obtain clothing/put away  Lower Body Dressing/Undressing Lower body dressing   What is the patient wearing?: Pants, Non-skid slipper socks     Pants- Performed by patient: Thread/unthread right pants leg, Thread/unthread left pants leg, Pull pants up/down Pants- Performed by helper: Thread/unthread right pants leg, Thread/unthread left pants leg, Fasten/unfasten pants Non-skid slipper socks- Performed by patient: Don/doff right sock, Don/doff left sock Non-skid slipper socks- Performed by helper: Don/doff right sock, Don/doff left sock               TED Hose - Performed by helper: Don/doff right TED hose, Don/doff left TED hose  Lower body assist Assist for lower body dressing: Touching or steadying assistance (Pt > 75%)      Toileting Toileting Toileting activity did not occur:  No continent bowel/bladder event Toileting steps completed by patient: Adjust clothing prior to toileting, Adjust clothing after toileting Toileting steps completed by helper: Performs perineal hygiene    Toileting assist Assist level: Two helpers   Transfers Chair/bed transfer Chair/bed transfer activity did not occur: Safety/medical concerns Chair/bed transfer method: Ambulatory, Stand pivot Chair/bed  transfer assist level: Touching or steadying assistance (Pt > 75%) Chair/bed transfer assistive device: Armrests, Patent attorneyWalker     Locomotion Ambulation     Max distance: 150' Assist level: Touching or steadying assistance (Pt > 75%)   Wheelchair   Type: Manual Max wheelchair distance: 150 Assist Level: Touching or steadying assistance (Pt > 75%)  Cognition Comprehension Comprehension assist level: Understands basic 50 - 74% of the time/ requires cueing 25 - 49% of the time  Expression Expression assist level: Expresses basic 50 - 74% of the time/requires cueing 25 - 49% of the time. Needs to repeat parts of sentences.  Social Interaction Social Interaction assist level: Interacts appropriately 50 - 74% of the time - May be physically or verbally inappropriate.  Problem Solving Problem solving assist level: Solves basic less than 25% of the time - needs direction nearly all the time or does not effectively solve problems and may need a restraint for safety  Memory Memory assist level: Recognizes or recalls less than 25% of the time/requires cueing greater than 75% of the time   Medical Problem List and Plan: 1.  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  DVT Prophylaxis/Anticoagulation: Subcutaneous heparin initiated 06/17/2018.  Monitor for any bleeding episodes 3. Pain Management: Tylenol as needed 4. Mood: Aricept 10 mg nightly, Xanax 0.125 mg nightly, Zoloft 100 mg daily, Seroquel 12.5 mg twice daily 5. Neuropsych: This patient is not capable of making decisions on his own behalf. 6. Skin/Wound Care: Routine skin checks  - scalp wound healing     7. Fluids/Electrolytes/Nutrition: encourage PO     \ -BUN improved to 23/1.59 8/12---> stable 8/20 27/1.83  -now on thin liquids  -off IVF    -added megace and eating nearly 100%---can wean as outpt   -family will need to encourage eating/cue at  home 8.  Dysphasia.  Dysphasia #1 -advanced to thins     9.  Hypertension.  Clonidine 0.3 mg TID, hydralazine 50 mg every 8 hours, Norvasc 10 mg daily.   bp's reasonable at present 8/21 Vitals:   07/08/18 2102 07/09/18 0630  BP:  (!) 152/71  Pulse: 75 77  Resp: 16 18  Temp: 98.3 F (36.8 C) 98.3 F (36.8 C)  SpO2: 97%    10.  Diabetes mellitus.  Hemoglobin A1c 7.0.  Glucotrol 5 mg daily, Glucophage 1000 mg twice daily.    -continue glucotrol 5mg  bid now CBG (last 3)  Recent Labs    07/08/18 2142 07/09/18 0650 07/09/18 1152  GLUCAP 107* 92 165*  Controlled 8/21 11.  CKD stage III. Cr 1.59   12.  BPH.  Flomax 0.8 mg daily.    13.  CAD with CABG 2003.  Aspirin currently on hold due to left temporal lobe hemorrhage.  No chest pain or shortness of breath 13. ABLA:     -stool OB negative, no signs of blood loss   -hgb 9.5 8/9---> 10.2 8/16                LOS (Days) 21 A FACE TO FACE EVALUATION  WAS PERFORMED  Ranelle OysterZachary T Bonifacio Pruden, MD 07/09/2018 12:22 PM

## 2018-07-09 NOTE — Discharge Summary (Signed)
Discharge summary job [phone]

## 2018-07-09 NOTE — Discharge Instructions (Signed)
Inpatient Rehab Discharge Instructions  Mark Montoya Discharge date and time: No discharge date for patient encounter.   Activities/Precautions/ Functional Status: Activity: activity as tolerated Diet:  Wound Care: none needed Functional status:  ___ No restrictions     ___ Walk up steps independently ___ 24/7 supervision/assistance   ___ Walk up steps with assistance ___ Intermittent supervision/assistance  ___ Bathe/dress independently ___ Walk with walker     _x__ Bathe/dress with assistance ___ Walk Independently    ___ Shower independently ___ Walk with assistance    ___ Shower with assistance ___ No alcohol     ___ Return to work/school ________   COMMUNITY REFERRALS UPON DISCHARGE:    Home Health:   PT    OT     ST                     Agency:  [facility] Phone: [phone]   Medical Equipment/Items Ordered: wheelchair, hospital bed, walker, commode and tub bench                                                     Agency/Supplier:  [facility] @ [phone]   GENERAL COMMUNITY RESOURCES FOR PATIENT/FAMILY:  Support Groups:  Brain Injury support group (flyer)      Special Instructions: No driving  No aspirin products until follow-up with neurology services   My questions have been answered and I understand these instructions. I will adhere to these goals and the provided educational materials after my discharge from the hospital.  Patient/Caregiver Signature _______________________________ Date __________  Clinician Signature _______________________________________ Date __________  Please bring this form and your medication list with you to all your follow-up doctor's appointments.

## 2018-07-09 NOTE — Progress Notes (Signed)
Physical Therapy Discharge Summary  Patient Details  Name: Mark Montoya MRN: [id number] Date of Birth: Nov 14, 1941  Today's Date: 07/09/2018 PT Individual Time: 0920-1020 PT Individual Time Calculation (min): 60 min    Patient has met 6 of 9 long term goals due to improved activity tolerance, improved balance, improved postural control, functional use of  right upper extremity, right lower extremity, left upper extremity and left lower extremity and improved attention.  Patient to discharge at an ambulatory level [place].   Patient's care partner is independent to provide the necessary physical assistance for gait and transfers.  Pt's care partner did not attend further family ed for stairs that was recommended by PT.  Reasons goals not met: pt is limited by cognition  Recommendation:  Patient will benefit from ongoing skilled PT services in home health setting to continue to advance safe functional mobility, address ongoing impairments in safety, balance, activity toleance, and minimize fall risk.  Equipment: RW  Reasons for discharge: treatment goals met and discharge from hospital  Patient/family agrees with progress made and goals achieved: Yes  PT Discharge  Pt asleep in bed, initially stating he was not doing well.  Unable to state pain, and no S/S of pain.  PT donned TEDs and shoes.  Supine > sit with min assist.  Stand pivot with RW to w/c, min assist.  W/c propulsion on level tile.   Seated R/L long arc quad knee extension, R/L hip flexion, toe taps, x 15 each   Gait training with Rw on level tile and up/down curb/step, and on stairs with bil rails. Pt requires cues throughout session for safe use of RW, placing entire foot on each step tread,    Precautions/Restrictions Restrictions Weight Bearing Restrictions: No  Pain Pain Assessment Pain Scale: 0-10 Pain Score: 0-No pain Vision/Perception  Wears reading glasses; difficult to assess due to  cognition Perception Perception: Impaired Praxis Praxis: Impaired Praxis Impairment Details: Ideation;Motor planning;Initiation  Cognition Overall Cognitive Status: Impaired/Different from baseline Arousal/Alertness: Lethargic Orientation Level: Oriented to person Sustained Attention: Impaired Memory: Impaired Memory Impairment: Decreased recall of new information;Decreased short term memory Safety/Judgment: Impaired Sensation Sensation Light Touch: Appears Intact Proprioception: Appears Intact Coordination Gross Motor Movements are Fluid and Coordinated: No Fine Motor Movements are Fluid and Coordinated: No Motor  Motor Motor: Motor apraxia;Hemiplegia Motor - Discharge Observations: slight improvement in motor planning during stay     Gait Gait Pattern: Impaired Gait Pattern: Trunk flexed;Decreased trunk rotation;Narrow base of support;Scissoring Gait velocity: decreased Stairs / Additional Locomotion Stairs: Yes Stairs Assistance: Minimal Assistance - Patient > 75% Stair Management Technique: Two rails Number of Stairs: 8 Height of Stairs: 3 Wheelchair Mobility Wheelchair Mobility: Yes Wheelchair Assistance: Minimal assistance - Patient >75% Wheelchair Propulsion: Both upper extremities Wheelchair Parts Management: Needs assistance Distance: 150  Trunk/Postural Assessment  Cervical Assessment Cervical Assessment: Exceptions to WFL(head forward) Thoracic Assessment Thoracic Assessment: Exceptions to WFL(rounded shoulders) Lumbar Assessment Lumbar Assessment: Exceptions to WFL(posterior pelvic tilt) Postural Control Postural Control: Deficits on evaluation(delayed/insufficient) Protective Responses: delayed and inadequate  Balance Balance Balance Assessed: Yes Dynamic Sitting Balance Dynamic Sitting - Level of Assistance: 5: Stand by assistance Static Standing Balance Static Standing - Level of Assistance: 4: Min assist Dynamic Standing Balance Dynamic  Standing - Level of Assistance: 4: Min assist Extremity Assessment      RLE Assessment RLE Assessment: Within Functional Limits LLE Assessment LLE Assessment: Within Functional Limits   See Function Navigator for Current Functional Status.  COOK,CAROLINE 07/09/2018, 10:40  AM   

## 2018-07-09 NOTE — Progress Notes (Signed)
Speech Language Pathology Discharge Summary  Patient Details  Name: Mark Montoya MRN: [id number] Date of Birth: Dec 16, 1940  Today's Date: 07/09/2018 SLP Individual Time: 0730-0825 SLP Individual Time Calculation (min): 55 min   Skilled Therapeutic Interventions:   Skilled treatment session focused on dysphagia and cognitive goals. SLP facilitated session by providing skilled observation with breakfast meal of Dys. 1 textures with thin liquids. Patient consumed meal without overt s/s of aspiration and required overall Min A verbal cues for use of swallowing strategies. Recommend patient continue current diet. Patient required Min A verbal cues for sustained attention and problem solving with self-feeding throughout session and was able to make his needs known at the phrase level with extra time and was ~75% intelligible.  Patient left upright in bed with alarm on and all needs within reach. Continue with current plan of care.   Patient has met 6 of 8 long term goals.  Patient to discharge at overall Mod level.   Reasons goals not met: Patient continues to require overall Max A multimodal cues for functional problem solving and emergent awareness.    Clinical Impression/Discharge Summary: Patient has made functional but inconsistent gains and has met 6 of 8 LTG's this admission. Currently, patient is consuming Dys. 1 textures with thin liquids with minimal overt s/s of aspiration and Min A verbal cues for use of swallowing compensatory strategies. Solids have not been upgraded due to prolonged oral transit with decreased attention to bolus resulting in severe oral residue. Patient continues to demonstrate severe aphasia impacting both expressive and receptive language. Patient requires overall Min A for auditory comprehension of basic information but Mod-Max A multimodal cues for expression of wants/needs at the phrase level. Expressive language has had limited improvement due to severe cognitive  impairments which impacts his awareness and ability to utilize strategies. Patient continues to demonstrate behaviors consistent with a [place]o Level VI-VII and requires overall Mod A multimodal cues to complete functional and familiar tasks safely in regards to attention, initiation and problem solving. Patient's cognitive function is further exacerbated by baseline dementia.  Patient and family education is complete and patient will discharge home with 24 hour supervision from family. Patient would benefit from f/u SLP services to maximize his cognitive-linguistic and swallowing function in order to reduce caregiver burden.   Care Partner:  Caregiver Able to Provide Assistance: Yes  Type of Caregiver Assistance: Physical;Cognitive  Recommendation:  24 hour supervision/assistance;Home Health SLP  Rationale for SLP Follow Up: Reduce caregiver burden;Maximize functional communication;Maximize cognitive function and independence;Maximize swallowing safety   Equipment: N/A   Reasons for discharge: Discharged from hospital   Patient/Family Agrees with Progress Made and Goals Achieved: Yes   Function:  Eating Eating   Modified Consistency Diet: Yes Eating Assist Level: Supervision or verbal cues;More than reasonable amount of time;Set up assist for   Eating Set Up Assist For: Opening containers;Cutting food    Cognition Comprehension Comprehension assist level: Understands basic 50 - 74% of the time/ requires cueing 25 - 49% of the time  Expression   Expression assist level: Expresses basic 50 - 74% of the time/requires cueing 25 - 49% of the time. Needs to repeat parts of sentences.  Social Interaction Social Interaction assist level: Interacts appropriately 50 - 74% of the time - May be physically or verbally inappropriate.  Problem Solving Problem solving assist level: Solves basic 25 - 49% of the time - needs direction more than half the time to initiate, plan or complete simple  activities  Memory Memory assist level: Recognizes or recalls 25 - 49% of the time/requires cueing 50 - 75% of the time   ,  07/09/2018, 3:03 PM

## 2018-07-09 NOTE — Discharge Summary (Signed)
NAME: Mark Montoya, Mark F. MEDICAL RECORD [id number] ACCOUNT [id number] DATE OF BIRTH:Sep 24, 1941 FACILITY[facility]ATION[facility] [place]YSICIAN:ZACHARY SWARTZ, MD  DISCHARGE SUMMARY  DATE OF DISCHARGE:  07/10/2018  DATE OF ADMIT:  06/18/2018  DATE OF DISCHARGE:  07/10/2018   DISCHARGE DIAGNOSES: 1.  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 2.  Subcutaneous heparin for deep venous thrombosis prophylaxis initiated 06/17/2018.   3.  Pain management. 4.  Mood. 5.  Dysphagia. 6.  Hypertension. 7.  Diabetes mellitus. 8.  Chronic kidney stage III. 9.  Benign prostatic hypertrophy. 10.  Coronary artery disease with CABG 2003.   11.  Acute on chronic anemia.  Montoya COURSE:  A 77 year old right-handed male with history of dementia, maintained on Aricept, CVA maintained on aspirin, hypertension, diabetes mellitus, CKD stage III, CAD with CABG.  Per chart review lives with granddaughter, cousin and cousin's  wife.  Independent prior to admission and driving.  Family assisted as needed.  [facility] with laceration left parietal scalp.  The patient cannot recall full events of the fall.  The patient noted to be  hypertensive.  Cranial CT scan showed left temporal hemorrhage.  A 5.5 cc left temporal lobe hematoma.  Small volume left lateral intraventricular hemorrhage.  Moderate left scalp hematoma without skull fracture, multiple old small infarctions.  MRI of  the brain demonstrated left temporal lobe hemorrhage without surrounding edema as well as mild regional mass effect.  MRA with no large vessel occlusion or stenosis.  Echocardiogram with ejection fraction of 70%, no wall motion abnormalities.  The  patient underwent repair of scalp laceration.  Latest cranial CT scan without hydrocephalus.  Subcutaneous heparin initiated for DVT prophylaxis  06/17/2018.  Maintained on a dysphagia #1 honey thick liquid diet.  The patient was admitted for   comprehensive rehabilitation program.  PAST MEDICAL HISTORY:  See discharge diagnoses.  SOCIAL HISTORY:  Lives with granddaughter, cousin and family.  Independent prior to admission.  FUNCTIONAL STATUS:  Upon admission to rehab services was moderate assist 75 feet rolling walker, moderate assist sit to stand, min mod assist with activities of daily living.  PHYSICAL EXAMINATION: VITAL SIGNS:  Blood pressure 155/84, pulse 67, temperature 97, respirations 18. GENERAL:  Alert male in no acute distress.  Globally aphasic. HEENT:  EOMs intact. NECK:  Supple, nontender, no JVD. CARDIOVASCULAR:  Rate controlled. ABDOMEN:  Soft, nontender, good bowel sounds. LUNGS:  Clear to auscultation without wheeze.  REHABILITATION Montoya COURSE:  The patient was admitted to inpatient rehabilitation services.  Therapies initiated on a 3-hour daily basis, consisting of physical therapy, occupational therapy, speech therapy and rehabilitation nursing.  The following  issues were addressed during patient's rehabilitation stay.  Pertaining to the patient's traumatic brain injury, left temporal lobe hemorrhage remained stable.  He would follow up with neurology services.  Subcutaneous heparin for DVT prophylaxis had  been initiated 06/17/2018 during his Montoya stay and discontinued at time of discharge.  He was advised at this time no aspirin products.  Noted history of dementia.  He remained on Aricept, Xanax, Seroquel as well as Zoloft.  Resting well at night,  participating with therapies.  Family to assist on discharge.  Dysphagia, followed by speech therapy and advanced to thin liquids.  No signs of aspiration.  Blood pressure is controlled on present regimen of hydralazine, clonidine and Norvasc.  Blood  sugars overall controlled.  Hemoglobin A1c of 7.  The patient on metformin as well as Glucotrol.  He did have a history of CAD with CABG.  Aspirin currently on hold due to left temporal lobe hemorrhage.   Acute on chronic anemia stable.  Latest hemoglobin  10.2.  The patient received weekly collaborative interdisciplinary team conferences to discuss estimated length of stay, family teaching, any barriers to discharge.  Ambulates to and from the therapy gym with increased time.  Minimal assist overall for  balance.  Max verbal, tactile cues.  Following for safety using rolling walker management to and from the toilet minimal assistance.  Again, using rolling walker.  Activities of daily living and homemaking sit to stand, rolling walker with verbal cues  for hand placement.  Ambulates into the bathroom rolling walker, contact guard assist and intermittently minimal assist for safety negotiation.  He was able to participate with bathing, dressing and again minimal cues.  Full family teaching was completed  and planned discharge to home.  DISCHARGE MEDICATIONS:  Included Xanax 0.125 mg p.o. daily, Norvasc 10 mg p.o. daily, clonidine 0.3 mg t.i.d., Aricept 10 mg at bedtime, ferrous sulfate 325 mg daily, Glucotrol 5 mg b.i.d., hydralazine 50 mg q.8h., Megace had been discontinued, metformin  1000 mg p.o. b.i.d., Lopressor 12.5 mg p.o. b.i.d., Protonix 40 mg p.o. daily, Seroquel 12.5 mg p.o. b.i.d., Zoloft 100 mg p.o. daily, Flomax 0.8 mg daily, Tylenol as needed.    His diet was a dysphagia #1 thin liquid.  The patient will follow up with Dr. Faith RogueZachary Swartz at the outpatient rehab service office as directed; Dr. Roda ShuttersXu of neurology services, call for appointment; Dr. John GiovanniStephen [facility] medical management.  SPECIAL INSTRUCTIONS:  No aspirin products until further notice by neurology services.  TN/NUANCE D:07/09/2018 T:07/09/2018 JOB:[id number]

## 2018-07-09 NOTE — Progress Notes (Signed)
Occupational Therapy Discharge Summary  Patient Details  Name: Mark Montoya MRN: [id number] Date of Birth: [birth year]   Pt greeted sitting in wc and agreeable to OT. Pt more awake this afternoon for most of session with improved initiation and attention to tasks. Treatment session focused on increased independence within BADL tasks. Pt able to complete bathing/dressing tasks at the sink with overall supervision, but mod verbal cues for initiation and task completion. CGA for functional ambulation w/ RW to access bathroom for toilet and tub bench transfers. Pt very fatigued after BADL tasks and unable to participate further. Pt returned to bed at end of session with CGA to ambulate to bed with RW and supervision to return to supine. Pt left semi-reclined with needs met.   Today's Date: 07/09/2018 OT Individual Time: 1300-1405 OT Individual Time Calculation (min): 65 min  and Today's Date: 07/09/2018 OT Missed Time: 25 Minutes Missed Time Reason: Patient fatigue  Patient has met 9 of 9 long term goals due to improved activity tolerance, improved balance, postural control, ability to compensate for deficits, functional use of  RIGHT upper and RIGHT lower extremity, improved attention, improved awareness and improved coordination.  Patient to discharge at overall CGA/ Supervision level.  Patient's care partner is independent to provide the necessary physical and cognitive assistance at discharge.    Reasons goals not met: n/a  Recommendation:  Patient will benefit from ongoing skilled OT services in home health setting to continue to advance functional skills in the area of BADL and Reduce care partner burden.  Equipment: 3-in-1 BSC, tub transfer bench, RW, and wheelchair,  Reasons for discharge: treatment goals met and discharge from hospital  Patient/family agrees with progress made and goals achieved: Yes  OT Discharge Precautions/Restrictions  Precautions Precautions:  Fall Restrictions Weight Bearing Restrictions: No General OT Amount of Missed Time: 25 Minutes Pain Pain Assessment Pain Scale: 0-10 Pain Score: 0-No pain ADL ADL ADL Comments: Please see functional navigator Perception  Perception: Impaired Inattention/Neglect: (R inattention) Praxis Praxis: Impaired Praxis Impairment Details: Ideation;Motor planning;Initiation Cognition Overall Cognitive Status: Impaired/Different from baseline Arousal/Alertness: Lethargic Orientation Level: Oriented to person;Oriented to place;Oriented to situation Focused Attention: Appears intact Sustained Attention: Impaired Sustained Attention Impairment: Verbal basic;Functional basic Memory: Impaired Memory Impairment: Decreased recall of new information;Decreased short term memory Decreased Short Term Memory: Verbal basic;Functional basic Awareness: Impaired Awareness Impairment: Emergent impairment Problem Solving: Impaired Problem Solving Impairment: Verbal basic;Functional basic Safety/Judgment: Impaired [employer] Scales of Cognitive Functioning: Confused/appropriate Sensation Sensation Light Touch: Appears Intact Proprioception: Appears Intact Coordination Gross Motor Movements are Fluid and Coordinated: No Fine Motor Movements are Fluid and Coordinated: No Coordination and Movement Description: Improved since eval Motor  Motor Motor: Motor apraxia;Hemiplegia Motor - Discharge Observations: slight improvement in motor planning during stay Mobility  Transfers Sit to Stand: Supervision/Verbal cueing Stand to Sit: Supervision/Verbal cueing  Trunk/Postural Assessment  Cervical Assessment Cervical Assessment: Exceptions to WFL(head forward) Thoracic Assessment Thoracic Assessment: Exceptions to WFL(rounded shoulders) Lumbar Assessment Lumbar Assessment: Exceptions to WFL(posterior pelvic tilt)  Balance Balance Balance Assessed: Yes Dynamic Sitting Balance Dynamic Sitting -  Level of Assistance: 5: Stand by assistance Static Standing Balance Static Standing - Level of Assistance: 5: Stand by assistance Dynamic Standing Balance Dynamic Standing - Level of Assistance: 5: Stand by assistance Extremity/Trunk Assessment RUE Assessment General Strength Comments: 3+/5 LUE Assessment General Strength Comments: 4-/5   See Function Navigator for Current Functional Status.  Daneen Schick Hong Timm 07/09/2018, 3:51 PM

## 2018-07-10 LAB — GLUCOSE, CAPILLARY: Glucose-Capillary: 98 mg/dL (ref 70–99)

## 2018-07-10 MED ORDER — ALPRAZOLAM 0.25 MG PO TABS: 0.1250 mg | ORAL_TABLET | Freq: Every day | ORAL | 0 refills | Status: AC

## 2018-07-10 MED ORDER — HYDRALAZINE HCL 50 MG PO TABS: 50.0000 mg | ORAL_TABLET | Freq: Three times a day (TID) | ORAL | 0 refills | Status: AC

## 2018-07-10 MED ORDER — CLONIDINE HCL 0.3 MG PO TABS: 0.3000 mg | ORAL_TABLET | Freq: Three times a day (TID) | ORAL | 11 refills | Status: AC

## 2018-07-10 MED ORDER — METFORMIN HCL 500 MG PO TABS: 1000.0000 mg | ORAL_TABLET | Freq: Two times a day (BID) | ORAL | 0 refills | Status: AC

## 2018-07-10 MED ORDER — ROSUVASTATIN CALCIUM 10 MG PO TABS: 10.0000 mg | ORAL_TABLET | Freq: Every day | ORAL | 0 refills | Status: AC

## 2018-07-10 MED ORDER — METOPROLOL TARTRATE 25 MG PO TABS: 12.5000 mg | ORAL_TABLET | Freq: Two times a day (BID) | ORAL | 1 refills | Status: AC

## 2018-07-10 MED ORDER — ACETAMINOPHEN 325 MG PO TABS: 650.0000 mg | ORAL_TABLET | ORAL | Status: AC | PRN

## 2018-07-10 MED ORDER — DONEPEZIL HCL 10 MG PO TABS: 10.0000 mg | ORAL_TABLET | Freq: Every day | ORAL | 0 refills | Status: DC

## 2018-07-10 MED ORDER — QUETIAPINE FUMARATE 25 MG PO TABS: 12.5000 mg | ORAL_TABLET | Freq: Two times a day (BID) | ORAL | 0 refills | Status: DC

## 2018-07-10 MED ORDER — AMLODIPINE BESYLATE 10 MG PO TABS: 10.0000 mg | ORAL_TABLET | Freq: Every day | ORAL | 1 refills | Status: AC

## 2018-07-10 MED ORDER — TAMSULOSIN HCL 0.4 MG PO CAPS: 0.8000 mg | ORAL_CAPSULE | Freq: Every day | ORAL | 1 refills | Status: AC

## 2018-07-10 MED ORDER — FERROUS SULFATE 325 (65 FE) MG PO TABS: 325.0000 mg | ORAL_TABLET | Freq: Every day | ORAL | 3 refills | Status: AC

## 2018-07-10 MED ORDER — SERTRALINE HCL 100 MG PO TABS: 100.0000 mg | ORAL_TABLET | Freq: Every day | ORAL | 0 refills | Status: AC

## 2018-07-10 MED ORDER — VITAMIN C 500 MG PO TABS: 500.0000 mg | ORAL_TABLET | Freq: Every day | ORAL | 0 refills | Status: AC

## 2018-07-10 MED ORDER — GLIPIZIDE 5 MG PO TABS: 5.0000 mg | ORAL_TABLET | Freq: Two times a day (BID) | ORAL | 1 refills | Status: AC

## 2018-07-10 NOTE — Progress Notes (Signed)
Social Work  Discharge Note  The overall goal for the admission was met for:   Discharge location: Yes - home with grandson and granddaughter providing 24/7 assistance  Length of Stay: Yes - 22 days  Discharge activity level: Yes - supervision to minimal assistance  Home/community participation: Yes  Services provided included: MD, RD, PT, OT, SLP, RN, TR, Pharmacy and [place]: Medicare  Follow-up services arranged: Home Health: PT, OT, ST via [place], DME: 18x18 lightweight w/c, cushion, rolling walker, hospital bed, 3n1 commode and tub bench via [facility] and Patient/Family has no preference for HH/DME agencies  Comments (or additional information):  Patient/Family verbalized understanding of follow-up arrangements: Yes  Individual responsible for coordination of the follow-up plan: grandchildren  Confirmed correct DME delivered: Lennart Pall 07/10/2018    Ysabel Cowgill

## 2018-07-10 NOTE — Progress Notes (Signed)
[place] PHYSICAL MEDICINE & REHABILITATION     PROGRESS NOTE    Subjective/Complaints:  Pt sitting up in bed. No new complaints  ROS: Limited due to cognitive/behavioral     Objective:  No results found. No results for input(s): WBC, HGB, HCT, PLT in the last 72 hours. Recent Labs    07/08/18 0646  NA 137  K 4.9  CL 109  GLUCOSE 91  BUN 27*  CREATININE 1.83*  CALCIUM 9.2   CBG (last 3)  Recent Labs    07/09/18 1658 07/09/18 2129 07/10/18 0633  GLUCAP 112* 104* 98    Wt Readings from Last 3 Encounters:  06/25/18 80 kg  06/13/18 78.5 kg  05/19/17 79.4 kg     Intake/Output Summary (Last 24 hours) at 07/10/2018 0909 Last data filed at 07/09/2018 1900 Gross per 24 hour  Intake 300 ml  Output 100 ml  Net 200 ml    Vital Signs: Blood pressure 137/79, pulse 69, temperature 98.7 F (37.1 C), temperature source Oral, resp. rate 18, height 5\' 7"  (1.702 m), weight 80 kg, SpO2 96 %. Physical Exam:  Constitutional: No distress . Vital signs reviewed. HEENT: EOMI, oral membranes moist Neck: supple Cardiovascular: RRR without murmur. No JVD    Respiratory: CTA Bilaterally without wheezes or rales. Normal effort    GI: BS +, non-tender, non-distended   Musculoskeletal: no edema Neurological: alert, still with word finding deficits.  Speech continues to improve Right neglect evident Skin: Scalp wound healing nicely  Psychiatric: generally cooperative    Assessment/Plan: 1. Functional deficits secondary to TBI which require 3+ hours per day of interdisciplinary therapy in a comprehensive inpatient rehab setting. Physiatrist is providing close team supervision and 24 hour management of active medical problems listed below. Physiatrist and rehab team continue to assess barriers to discharge/monitor patient progress toward functional and medical goals.  Function:  Bathing Bathing position   Position: Wheelchair/chair at sink  Bathing parts Body parts bathed  by patient: Right upper leg, Left upper leg, Front perineal area, Right arm, Left arm, Chest, Abdomen, Right lower leg, Left lower leg, Buttocks Body parts bathed by helper: Back  Bathing assist Assist Level: Supervision or verbal cues, Set up   Set up : To obtain items  Upper Body Dressing/Undressing Upper body dressing Upper body dressing/undressing activity did not occur: N/A What is the patient wearing?: Pull over shirt/dress     Pull over shirt/dress - Perfomed by patient: Thread/unthread right sleeve, Put head through opening, Thread/unthread left sleeve, Pull shirt over trunk Pull over shirt/dress - Perfomed by helper: Pull shirt over trunk        Upper body assist Assist Level: Set up, Supervision or verbal cues, More than reasonable time   Set up : To obtain clothing/put away  Lower Body Dressing/Undressing Lower body dressing   What is the patient wearing?: Pants, Shoes, Non-skid slipper socks     Pants- Performed by patient: Thread/unthread right pants leg, Thread/unthread left pants leg, Pull pants up/down Pants- Performed by helper: Thread/unthread right pants leg, Thread/unthread left pants leg, Fasten/unfasten pants Non-skid slipper socks- Performed by patient: Don/doff right sock, Don/doff left sock Non-skid slipper socks- Performed by helper: Don/doff right sock, Don/doff left sock     Shoes - Performed by patient: Don/doff right shoe, Don/doff left shoe, Fasten right, Fasten left         TED Hose - Performed by helper: Don/doff right TED hose, Don/doff left TED hose  Lower body assist Assist  for lower body dressing: Supervision or verbal cues, Set up   Set up : To obtain clothing/put away  Toileting Toileting Toileting activity did not occur: No continent bowel/bladder event Toileting steps completed by patient: Adjust clothing prior to toileting, Performs perineal hygiene, Adjust clothing after toileting Toileting steps completed by helper: Performs  perineal hygiene    Toileting assist Assist level: Set up/obtain supplies, Supervision or verbal cues   Transfers Chair/bed transfer Chair/bed transfer activity did not occur: Safety/medical concerns Chair/bed transfer method: Ambulatory, Stand pivot Chair/bed transfer assist level: Touching or steadying assistance (Pt > 75%) Chair/bed transfer assistive device: Armrests, Patent attorneyWalker     Locomotion Ambulation     Max distance: 150' Assist level: Touching or steadying assistance (Pt > 75%)   Wheelchair   Type: Manual Max wheelchair distance: 150 Assist Level: Touching or steadying assistance (Pt > 75%)  Cognition Comprehension Comprehension assist level: Understands basic 50 - 74% of the time/ requires cueing 25 - 49% of the time  Expression Expression assist level: Expresses basic 50 - 74% of the time/requires cueing 25 - 49% of the time. Needs to repeat parts of sentences.  Social Interaction Social Interaction assist level: Interacts appropriately 50 - 74% of the time - May be physically or verbally inappropriate.  Problem Solving Problem solving assist level: Solves basic 25 - 49% of the time - needs direction more than half the time to initiate, plan or complete simple activities  Memory Memory assist level: Recognizes or recalls 25 - 49% of the time/requires cueing 50 - 75% of the time   Medical Problem List and Plan: 1.  Decreased functional mobility secondary to TBI with left temporal lobe hemorrhage  -dc home today  -Patient to see Rehab MD/provider in the office for transitional care encounter in 1-2 weeks.  2.  DVT Prophylaxis/Anticoagulation: Subcutaneous heparin initiated 06/17/2018.  Monitor for any bleeding episodes 3. Pain Management: Tylenol as needed 4. Mood: Aricept 10 mg nightly, Xanax 0.125 mg nightly, Zoloft 100 mg daily, Seroquel 12.5 mg twice daily 5. Neuropsych: This patient is not capable of making decisions on his own behalf. 6. Skin/Wound Care: Routine skin  checks  - scalp wound healing     7. Fluids/Electrolytes/Nutrition: encourage PO     \ -BUN improved to 23/1.59 8/12---> stable 8/20 27/1.83  -now on thin liquids  -off IVF    -added megace and eating nearly 100%---  wean as outpt over next 2-4 weeks   -family will need to encourage eating/cue at home 8.  Dysphasia.  Dysphasia #1 -  thins     9.  Hypertension.  Clonidine 0.3 mg TID, hydralazine 50 mg every 8 hours, Norvasc 10 mg daily.   bp's reasonable at present 8/21 Vitals:   07/09/18 2023 07/10/18 0457  BP: 129/81 137/79  Pulse: 64 69  Resp: 18 18  Temp: (!) 97.4 F (36.3 C) 98.7 F (37.1 C)  SpO2: 97% 96%   10.  Diabetes mellitus.  Hemoglobin A1c 7.0.  Glucotrol 5 mg daily, Glucophage 1000 mg twice daily.    -continue glucotrol 5mg  bid now CBG (last 3)  Recent Labs    07/09/18 1658 07/09/18 2129 07/10/18 0633  GLUCAP 112* 104* 98  Controlled 8/22 11.  CKD stage III. Cr 1.59   12.  BPH.  Flomax 0.8 mg daily.    13.  CAD with CABG 2003.  Aspirin currently on hold due to left temporal lobe hemorrhage.  No chest pain or shortness of breath  13. ABLA:     -stool OB negative, no signs of blood loss   -hgb 9.5 8/9---> 10.2 8/16---follow up as outpt                LOS (Days) 22 A FACE TO FACE EVALUATION WAS PERFORMED  Ranelle Oyster, MD 07/10/2018 9:09 AM

## 2018-07-11 ENCOUNTER — Telehealth: Payer: Self-pay | Admitting: *Deleted

## 2018-07-11 NOTE — Telephone Encounter (Signed)
Ben PT calling for 2 wk4.  Approval given.

## 2018-07-11 NOTE — Telephone Encounter (Signed)
Transitional Care call-07/11/18 @1 :19 left VM for Carie CaddyStacy Mersereau [facility] to call office and left information about the appt on VM as well  Appointment Wednesday 07/16/18 arrive by 11:20 for 11:40 with Dr Riley KillSwartz [address]

## 2018-07-15 ENCOUNTER — Telehealth: Payer: Self-pay | Admitting: Registered Nurse

## 2018-07-15 ENCOUNTER — Telehealth: Payer: Self-pay

## 2018-07-15 NOTE — Telephone Encounter (Signed)
2nd attenpt, left voice mail. No answer.

## 2018-07-15 NOTE — Telephone Encounter (Signed)
Darel HongJudy, [facility] called requesting orders for ST 2wk4. Orders approved per discharge note.

## 2018-07-15 NOTE — Telephone Encounter (Signed)
[facility] 2[facility] 3wks.  Called her back and approved verbal orders.

## 2018-07-16 ENCOUNTER — Encounter: Payer: Medicare Other | Admitting: Physical Medicine & Rehabilitation

## 2018-07-16 NOTE — Telephone Encounter (Signed)
Encounter closed due to appt changed to 08/11/18 and therefore will be hospital follow up. 

## 2018-07-16 NOTE — Telephone Encounter (Signed)
Encounter closed due to appt changed to 08/11/18 and therefore will be hospital follow up.

## 2018-07-30 DIAGNOSIS — Z9181 History of falling: Secondary | ICD-10-CM

## 2018-07-30 DIAGNOSIS — Z951 Presence of aortocoronary bypass graft: Secondary | ICD-10-CM

## 2018-07-30 DIAGNOSIS — I129 Hypertensive chronic kidney disease with stage 1 through stage 4 chronic kidney disease, or unspecified chronic kidney disease: Secondary | ICD-10-CM | POA: Diagnosis not present

## 2018-07-30 DIAGNOSIS — R4701 Aphasia: Secondary | ICD-10-CM

## 2018-07-30 DIAGNOSIS — F0390 Unspecified dementia without behavioral disturbance: Secondary | ICD-10-CM

## 2018-07-30 DIAGNOSIS — R1310 Dysphagia, unspecified: Secondary | ICD-10-CM

## 2018-07-30 DIAGNOSIS — Z8673 Personal history of transient ischemic attack (TIA), and cerebral infarction without residual deficits: Secondary | ICD-10-CM

## 2018-07-30 DIAGNOSIS — I2510 Atherosclerotic heart disease of native coronary artery without angina pectoris: Secondary | ICD-10-CM

## 2018-07-30 DIAGNOSIS — S0101XD Laceration without foreign body of scalp, subsequent encounter: Secondary | ICD-10-CM

## 2018-07-30 DIAGNOSIS — S06350D Traumatic hemorrhage of left cerebrum without loss of consciousness, subsequent encounter: Secondary | ICD-10-CM | POA: Diagnosis not present

## 2018-07-30 DIAGNOSIS — E785 Hyperlipidemia, unspecified: Secondary | ICD-10-CM

## 2018-07-30 DIAGNOSIS — F419 Anxiety disorder, unspecified: Secondary | ICD-10-CM

## 2018-07-30 DIAGNOSIS — Z7984 Long term (current) use of oral hypoglycemic drugs: Secondary | ICD-10-CM

## 2018-07-30 DIAGNOSIS — N183 Chronic kidney disease, stage 3 (moderate): Secondary | ICD-10-CM

## 2018-07-30 DIAGNOSIS — F329 Major depressive disorder, single episode, unspecified: Secondary | ICD-10-CM

## 2018-07-30 DIAGNOSIS — N400 Enlarged prostate without lower urinary tract symptoms: Secondary | ICD-10-CM

## 2018-07-30 DIAGNOSIS — D631 Anemia in chronic kidney disease: Secondary | ICD-10-CM

## 2018-07-30 DIAGNOSIS — E1122 Type 2 diabetes mellitus with diabetic chronic kidney disease: Secondary | ICD-10-CM

## 2018-08-11 ENCOUNTER — Encounter: Payer: Medicare Other | Attending: Physical Medicine & Rehabilitation | Admitting: Physical Medicine & Rehabilitation

## 2018-08-11 ENCOUNTER — Encounter: Payer: Self-pay | Admitting: Physical Medicine & Rehabilitation

## 2018-08-11 ENCOUNTER — Encounter: Payer: Medicare Other | Admitting: Physical Medicine & Rehabilitation

## 2018-08-11 VITALS — BP 102/64 | HR 63 | Resp 14 | Ht 69.5 in | Wt 163.0 lb

## 2018-08-11 DIAGNOSIS — F0281 Dementia in other diseases classified elsewhere with behavioral disturbance: Secondary | ICD-10-CM

## 2018-08-11 DIAGNOSIS — E785 Hyperlipidemia, unspecified: Secondary | ICD-10-CM | POA: Diagnosis not present

## 2018-08-11 DIAGNOSIS — Z87820 Personal history of traumatic brain injury: Secondary | ICD-10-CM | POA: Diagnosis not present

## 2018-08-11 DIAGNOSIS — F0390 Unspecified dementia without behavioral disturbance: Secondary | ICD-10-CM | POA: Diagnosis not present

## 2018-08-11 DIAGNOSIS — R1310 Dysphagia, unspecified: Secondary | ICD-10-CM | POA: Diagnosis not present

## 2018-08-11 DIAGNOSIS — S06309D Unspecified focal traumatic brain injury with loss of consciousness of unspecified duration, subsequent encounter: Secondary | ICD-10-CM | POA: Diagnosis not present

## 2018-08-11 DIAGNOSIS — I10 Essential (primary) hypertension: Secondary | ICD-10-CM | POA: Diagnosis not present

## 2018-08-11 DIAGNOSIS — X58XXXD Exposure to other specified factors, subsequent encounter: Secondary | ICD-10-CM | POA: Diagnosis not present

## 2018-08-11 DIAGNOSIS — I2510 Atherosclerotic heart disease of native coronary artery without angina pectoris: Secondary | ICD-10-CM | POA: Diagnosis not present

## 2018-08-11 DIAGNOSIS — E119 Type 2 diabetes mellitus without complications: Secondary | ICD-10-CM | POA: Insufficient documentation

## 2018-08-11 DIAGNOSIS — F02818 Dementia in other diseases classified elsewhere, unspecified severity, with other behavioral disturbance: Secondary | ICD-10-CM

## 2018-08-11 DIAGNOSIS — Z8673 Personal history of transient ischemic attack (TIA), and cerebral infarction without residual deficits: Secondary | ICD-10-CM | POA: Insufficient documentation

## 2018-08-11 DIAGNOSIS — Z79899 Other long term (current) drug therapy: Secondary | ICD-10-CM | POA: Insufficient documentation

## 2018-08-11 DIAGNOSIS — Z951 Presence of aortocoronary bypass graft: Secondary | ICD-10-CM | POA: Diagnosis not present

## 2018-08-11 DIAGNOSIS — Z09 Encounter for follow-up examination after completed treatment for conditions other than malignant neoplasm: Secondary | ICD-10-CM | POA: Diagnosis present

## 2018-08-11 MED ORDER — QUETIAPINE FUMARATE 50 MG PO TABS: 50.0000 mg | ORAL_TABLET | Freq: Every day | ORAL | 2 refills | Status: DC

## 2018-08-11 NOTE — Patient Instructions (Addendum)
PLEASE FEEL FREE TO CALL OUR OFFICE WITH ANY PROBLEMS OR QUESTIONS [phone])     TRY GIVING SEROQUEL 50MG  AROUND 8PM. YOU CAN USE A HALF TAB LATER IN THE EVENING IF NEEDED FOR AGITATION.   IF HE DOES BETTER WITH THIS, WE CAN TRY TO BACK OFF THE XANAX.

## 2018-08-11 NOTE — Progress Notes (Signed)
Subjective:    Patient ID: Mark Montoya, male    DOB: August 16, 1941, 77 y.o.   MRN: [id number]  HPI   Mark Montoya is here in follow up of his TBI. He has been home for about a month.  Family has been providing 24-hour supervision.  They have seen improvement in his language and mobility.  Home health therapy has signed off.  Or they have not seen progress yet is from a cognitive standpoint.  He had dementia prior to the accident and now cognition is worse.  They have seen some sundowning symptoms in the evening hours to overnight.  His primary physician increased his Xanax to 1 mg at bedtime which seems to have helped sleep but he tends to be very combative and irritated at night usually after 6 or 7:00.pm.  We sent him home on Seroquel in addition to the low-dose Xanax.  The Seroquel which is being given in the evening at the 25 mg dose.  His appetite is picking up.  Bowels and bladder have been regular.  There have been no falls but family has been providing 24-hour supervision to make sure he stays upright.   Pain Inventory Average Pain 0 Pain Right Now 0 My pain is no pain  In the last 24 hours, has pain interfered with the following? General activity 0 Relation with others 0 Enjoyment of life 0 What TIME of day is your pain at its worst? no pain Sleep (in general) Fair  Pain is worse with: no pain Pain improves with: no pain Relief from Meds: no pain  Mobility walk with assistance use a walker  Function retired  Neuro/Psych bladder control problems bowel control problems trouble walking  Prior Studies hospital f/u  Physicians involved in your care hospital f/u   Family History  Problem Relation Age of Onset  . Heart disease Mother   . Heart failure Mother   . Heart disease Father   . CVA Father   . Cancer Brother   . Cancer Brother   . Cancer Brother   . Hypertension Daughter   . Heart disease Daughter   . Diabetes Daughter   . Multiple sclerosis Daughter    . Lupus Daughter   . Heart disease Brother        CABG  . Heart disease Brother        CABG  . Heart disease Brother        CABG   Social History   Socioeconomic History  . Marital status: Widowed    Spouse name: Not on file  . Number of children: Not on file  . Years of education: Not on file  . Highest education level: Not on file  Occupational History  . Occupation: unemployed   Social Needs  . Financial resource strain: Not on file  . Food insecurity:    Worry: Not on file    Inability: Not on file  . Transportation needs:    Medical: Not on file    Non-medical: Not on file  Tobacco Use  . Smoking status: Never Smoker  . Smokeless tobacco: Current User    Types: Chew  Substance and Sexual Activity  . Alcohol use: No  . Drug use: No  . Sexual activity: Not on file  Lifestyle  . Physical activity:    Days per week: Not on file    Minutes per session: Not on file  . Stress: Not on file  Relationships  . Social connections:  Talks on phone: Not on file    Gets together: Not on file    Attends religious service: Not on file    Active member of club or organization: Not on file    Attends meetings of clubs or organizations: Not on file    Relationship status: Not on file  Other Topics Concern  . Not on file  Social History Narrative   Resides with grandson and granddaughter in law    Past Surgical History:  Procedure Laterality Date  . CARDIAC CATHETERIZATION  01/22/2002   CABG recommended  . CORONARY ARTERY BYPASS GRAFT  02/03/2002   x5; LIMA-LAD, SVG-D1 and D2, SVG-OM1, and SVG-PDA  . ORIF TIBIA PLATEAU Right 11/02/2013   Procedure: OPEN REDUCTION INTERNAL FIXATION (ORIF) RIGHT TIBIAL PLATEAU;  Surgeon: Budd Palmer, MD;  Location: [facility] OR;  Service: Orthopedics;  Laterality: Right;   Past Medical History:  Diagnosis Date  . Abnormal echocardiogram    NUCLEAR STRESS TEST, 01/28/2007 - EKG negative for ischemia, no significant ischemia demonstrated  .  Coronary artery disease 02/03/02   status post CABG by Dr. Andrey Spearman. LIMA-LAD, seq SVG-D1-D2, SVG-OM, SVG-PDA.  Marland Kitchen Hyperlipidemia   . Hypertension   . Stroke (HCC)   . Structural heart disease    2D ECHO, 01/08/2005 - normal-mild  . Syncope    CAROTID DOPPLER, 01/08/2005 - No significant carotid vadcular disease  . Type 2 diabetes mellitus (HCC)    BP 102/64 (BP Location: Left Arm, Patient Position: Sitting, Cuff Size: Normal)   Pulse 63   Resp 14   Ht 5' 9.5" (1.765 m)   Wt 163 lb (73.9 kg)   SpO2 96%   BMI 23.73 kg/m    Opioid Risk Score:   Fall Risk Score:  `1  Depression screen PHQ 2/9  No flowsheet data found.  Review of Systems  Constitutional: Negative.   HENT: Negative.   Eyes: Negative.   Respiratory: Negative.   Gastrointestinal: Negative.        Uncontrolled bowel   Endocrine:       High blood sugar Low blood sugar  Genitourinary: Positive for frequency and urgency.       Incontinence  Musculoskeletal: Positive for gait problem.  Skin: Negative.   Hematological: Negative.   Psychiatric/Behavioral: Positive for confusion and decreased concentration.       Objective:   Physical Exam   General: Alert and oriented x 3, No apparent distress HEENT: Head is normocephalic, atraumatic, PERRLA, EOMI, sclera anicteric, oral mucosa pink and moist, dentition intact, ext ear canals clear, poor dentition Neck: Supple without JVD or lymphadenopathy Heart: Reg rate and rhythm. No murmurs rubs or gallops Chest: CTA bilaterally without wheezes, rales, or rhonchi; no distress Abdomen: Soft, non-tender, non-distended, bowel sounds positive. Extremities: No clubbing, cyanosis, or edema. Pulses are 2+ Skin: Clean and intact without signs of breakdown Neuro:   Patient is alert.  Oriented to name but not place.  Limited insight and awareness.  Able to review basic biographical information.  Follows simple commands with extra time.  Able to stand and transfer without  assistance.  Does walk leaning over the walker but shows reasonable balance.  Needs extra time to change directions.  Strength is 4-5 out of 5 in all 4 limbs with decreased fine motor coordination. Musculoskeletal: Full ROM, No pain with AROM or PROM in the neck, trunk, or extremities. Posture appropriate Psych: Patient slightly flat but generally otherwise appropriate.  He was not irritable nor anxious.  Assessment & Plan:  1.  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  3  -Spent extensive time discussing with daughter regarding his cognition and the fact that given his pre-morbid history of dementia that cognitive recovery expectation should be guarded.  I do think that he can make some progress now that he is in his home environment but those changes may be slow and ultimately subtle.  He has made progress in his balance and mobility as a whole.  Language is improved also. 2. Cognition/dementia: Aricept 10 mg nightly.  Maintain the current dose for now.  Need to consider decreasing to see if it helps with his evening agitation 3.  Mood: Xanax, Zoloft, Seroquel 4.  Diabetes: Per primary with glucophage, glucotrol 5.  Dysphagia: Appetite improved.      15 minutes of time was spent with the patient in examina tion, analysis and discussion of treatment plan.  I will see him back in about 2 months time.

## 2018-08-22 NOTE — Progress Notes (Deleted)
[facility] [address] [place]. [place] [address] [phone]       OFFICE FOLLOW UP NOTE  [name] Date of Birth:  09-30-1941 Medical Record Number:  [id number]   Reason for Referral:  hospital stroke follow up  CHIEF COMPLAINT:  No chief complaint on file.   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 History obtained from *** and chart review. Reviewed all radiology images and labs personally.  Mark Montoya 77 year old male with history of stroke, CAD status post CABG 2003, hypertension, hyperlipidemia, diabetes and syncope  who presented to [facility] with laceration to his L parietal scalp (unsure how it happened) with confusion. Found to be hypertensive. CT head reviewed and showed left lateral intraventricular hemorrhage, small volume subarachnoid hemorrhage and 5.5 cc left temporal lobe hemorrhage.  Transferred to [facility] for additional management.  Repeat CT head showed mild increase in size of left temporal lobe intraparenchymal hematoma without evidence of hydrocephalus, mass-effect or midline shift.  MRI brain reviewed and showed stable left temporal lobe hemorrhage with intraventricular extension.  MRA brain negative for large vessel occlusion or stenosis.  Carotid Dopplers unremarkable.  2D echo showed an EF of 65 to 70% without cardiac source of embolus identified.  LDL 35 and A1c 7.0.  Patient was on aspirin 81 mg PTA and it was stopped during admission due to [place].  Patient does have a history of dementia and did experience in hospital delirium therefore started on low-dose Seroquel along with continuation of home Aricept.  HTN upon arrival at 194/81 and became stabilized with Cleviprex on with home amlodipine and clonidine but recommended to hold lisinopril hydrochlorothiazide in setting of AKI  on CKD.  Recommend continuation of Crestor 10 mg a satisfactory lipid panel.  Episodes of hyper and hypoglycemia and recommended continued follow-up with PCP for DM management.  Patient was transferred to CIR in stable condition for ongoing PT/OT/ST.   ROS:   14 system review of systems performed and negative with exception of ***  PMH:  Past Medical History:  Diagnosis Date  . Abnormal echocardiogram    NUCLEAR STRESS TEST, 01/28/2007 - EKG negative for ischemia, no significant ischemia demonstrated  . Coronary artery disease 02/03/02   status post CABG by Dr. Merilynn Finland. LIMA-LAD, seq SVG-D1-D2, SVG-OM, SVG-PDA.  Marland Kitchen Hyperlipidemia   . Hypertension   . Stroke ([place])   . Structural heart disease    2D ECHO, 01/08/2005 - normal-mild  . Syncope    CAROTID DOPPLER, 01/08/2005 - No significant carotid vadcular disease  . Type 2 diabetes mellitus (HCC)     PSH:  Past Surgical History:  Procedure Laterality Date  . CARDIAC CATHETERIZATION  01/22/2002   CABG recommended  . CORONARY ARTERY BYPASS GRAFT  02/03/2002   x5; LIMA-LAD, SVG-D1 and D2, SVG-OM1, and SVG-PDA  . ORIF TIBIA PLATEAU Right 11/02/2013   Procedure: OPEN REDUCTION INTERNAL FIXATION (ORIF) RIGHT TIBIAL PLATEAU;  Surgeon: Rozanna Box, MD;  Location: [facility];  Service: Orthopedics;  Laterality: Right;    Social History:  Social History   Socioeconomic History  . Marital status: Widowed    Spouse name: Not on file  . Number of children: Not on file  . Years of education: Not on file  . Highest education level: Not on file  Occupational  History  . Occupation: unemployed   Social Needs  . Financial resource strain: Not on file  . Food insecurity:    Worry: Not on file    Inability: Not on file  . Transportation needs:    Medical: Not on file    Non-medical: Not on file  Tobacco Use  . Smoking status: Never Smoker  . Smokeless tobacco: Current User    Types: Chew  Substance and Sexual Activity  . Alcohol  use: No  . Drug use: No  . Sexual activity: Not on file  Lifestyle  . Physical activity:    Days per week: Not on file    Minutes per session: Not on file  . Stress: Not on file  Relationships  . Social connections:    Talks on phone: Not on file    Gets together: Not on file    Attends religious service: Not on file    Active member of club or organization: Not on file    Attends meetings of clubs or organizations: Not on file    Relationship status: Not on file  . Intimate partner violence:    Fear of current or ex partner: Not on file    Emotionally abused: Not on file    Physically abused: Not on file    Forced sexual activity: Not on file  Other Topics Concern  . Not on file  Social History Narrative   Resides with grandson and granddaughter in law     Family History:  Family History  Problem Relation Age of Onset  . Heart disease Mother   . Heart failure Mother   . Heart disease Father   . CVA Father   . Cancer Brother   . Cancer Brother   . Cancer Brother   . Hypertension Daughter   . Heart disease Daughter   . Diabetes Daughter   . Multiple sclerosis Daughter   . Lupus Daughter   . Heart disease Brother        CABG  . Heart disease Brother        CABG  . Heart disease Brother        CABG    Medications:   Current Outpatient Medications on File Prior to Visit  Medication Sig Dispense Refill  . acetaminophen (TYLENOL) 325 MG tablet Take 2 tablets (650 mg total) by mouth every 4 (four) hours as needed for mild pain (or temp > 37.5 C (99.5 F)).    Marland Kitchen ALPRAZolam (XANAX) 0.25 MG tablet Take 0.5 tablets (0.125 mg total) by mouth at bedtime. 30 tablet 0  . amLODipine (NORVASC) 10 MG tablet Take 1 tablet (10 mg total) by mouth daily. 30 tablet 1  . cloNIDine (CATAPRES) 0.3 MG tablet Take 1 tablet (0.3 mg total) by mouth 3 (three) times daily. 60 tablet 11  . Cyanocobalamin (B-12) 1000 MCG/ML KIT Inject 1 mL as directed every 30 (thirty) days.    Marland Kitchen donepezil  (ARICEPT) 10 MG tablet Take 1 tablet (10 mg total) by mouth at bedtime. 30 tablet 0  . ferrous sulfate 325 (65 FE) MG tablet Take 1 tablet (325 mg total) by mouth daily with breakfast. 30 tablet 3  . glipiZIDE (GLUCOTROL) 5 MG tablet Take 1 tablet (5 mg total) by mouth 2 (two) times daily before a meal. 60 tablet 1  . hydrALAZINE (APRESOLINE) 50 MG tablet Take 1 tablet (50 mg total) by mouth every 8 (eight) hours. 90 tablet 0  . metFORMIN (GLUCOPHAGE) 500  MG tablet Take 2 tablets (1,000 mg total) by mouth 2 (two) times daily with a meal. 60 tablet 0  . metoprolol tartrate (LOPRESSOR) 25 MG tablet Take 0.5 tablets (12.5 mg total) by mouth 2 (two) times daily. 60 tablet 1  . Multiple Vitamin (MULTIVITAMIN) tablet Take 1 tablet by mouth daily.    . QUEtiapine (SEROQUEL) 50 MG tablet Take 1 tablet (50 mg total) by mouth daily at 8 pm. MAY USE HALF TABLET AT BEDTIME IF NEEDED 45 tablet 2  . rosuvastatin (CRESTOR) 10 MG tablet Take 1 tablet (10 mg total) by mouth daily. 30 tablet 0  . senna-docusate (SENOKOT-S) 8.6-50 MG tablet Take 1 tablet by mouth 2 (two) times daily.    . sertraline (ZOLOFT) 100 MG tablet Take 1 tablet (100 mg total) by mouth daily. 30 tablet 0  . tamsulosin (FLOMAX) 0.4 MG CAPS capsule Take 2 capsules (0.8 mg total) by mouth daily after breakfast. 30 capsule 1  . vitamin C (ASCORBIC ACID) 500 MG tablet Take 1 tablet (500 mg total) by mouth daily. 30 tablet 0   No current facility-administered medications on file prior to visit.     Allergies:   Allergies  Allergen Reactions  . Lipitor [Atorvastatin Calcium]   . Other     provastatin  . Simvastatin      Physical Exam  There were no vitals filed for this visit. There is no height or weight on file to calculate BMI. No exam data present  General: well developed, well nourished, seated, in no evident distress Head: head normocephalic and atraumatic.   Neck: supple with no carotid or supraclavicular  bruits Cardiovascular: regular rate and rhythm, no murmurs Musculoskeletal: no deformity Skin:  no rash/petichiae Vascular:  Normal pulses all extremities  Neurologic Exam Mental Status: Awake and fully alert. Oriented to place and time. Recent and remote memory intact. Attention span, concentration and fund of knowledge appropriate. Mood and affect appropriate.  Cranial Nerves: Fundoscopic exam reveals sharp disc margins. Pupils equal, briskly reactive to light. Extraocular movements full without nystagmus. Visual fields full to confrontation. Hearing intact. Facial sensation intact. Face, tongue, palate moves normally and symmetrically.  Motor: Normal bulk and tone. Normal strength in all tested extremity muscles. Sensory.: intact to touch , pinprick , position and vibratory sensation.  Coordination: Rapid alternating movements normal in all extremities. Finger-to-nose and heel-to-shin performed accurately bilaterally. Gait and Station: Arises from chair without difficulty. Stance is normal. Gait demonstrates normal stride length and balance . Able to heel, toe and tandem walk without difficulty.  Reflexes: 1+ and symmetric. Toes downgoing.    NIHSS  *** Modified Rankin  ***    Diagnostic Data (Labs, Imaging, Testing)  Ct Head Wo Contrast  06/15/2018 1. Increased size of left intraventricular hemorrhage, now measuring 6.7 x 2.8 x 3.0 cm without hydrocephalus. 2.Increase in diffuse subarachnoid hemorrhage notedpredominantly in the posterior temporal and parietal lobes, the right occipital lobe, and along the medial frontal lobes bilaterally. 3. No significant change in mass effect. 4. Stable atrophy and white matter disease. 5. Stable remote lacunar infarcts involving the basal ganglia.   MRI Brain/MRA Head  06/13/2018 1. Left temporal lobe hemorrhage with intraventricular extension appears stable since the CT earlier today. Intra-axial blood products estimated at 17 mL. Surrounding  edema. Mild regional mass effect. 2. Small volume but more widespread bilateral subarachnoid hemorrhage than was evident by CT. Small volume intraventricular hemorrhage with no ventriculomegaly. 3. No acute infarct or new intracranial abnormality. 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 4. Motion degraded intracranial MRA with no large vessel occlusion or stenosis identified.  Ct Head Wo Contrast 06/13/2018 1. Slight increase in size of left temporal lobe intraparenchymal hematoma without midline shift or new mass effect.  2. Increased intraventricular blood in the left temporal horn without hydrocephalus.  3. Unchanged small volume subarachnoid hemorrhage along the right sylvian fissure.   Ct Head Wo Contrast 06/12/2018 1. 5.5 cc LEFT temporal lobe hematoma likely posttraumatic given head injury though hypertensive etiology possible. Small volume LEFT lateral intraventricular hemorrhage. Small volume subarachnoid hemorrhage.  2. Moderate LEFT scalp hematoma. No skull fracture.  3. Multiple old small infarcts. Mild chronic small vessel ischemic changes.   EKGSR with 1st degree AV block   DG Chest Portable 1 View 06/14/2018 Low lung volumes with linear opacity at the left lung base potentially atelectasis or new consolidation. Surgical changes of median sternotomy and CABG.  Echocardiogram  06/14/2018 - Left ventricle: The cavity size was normal. Systolic function wasvigorous. The estimated ejection fraction was in the range of 65%to 70%.Wall motion was normal; there were no regional wallmotion abnormalities. There was an increased relativecontribution of atrial contraction to ventricular filling.Doppler parameters are consistent with abnormal left ventricularrelaxation (grade 1 diastolic dysfunction). - Aortic valve: Trileaflet; moderately thickened, moderatelycalcified leaflets. 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 There was  mildregurgitation. Valve area (VTI): 1.51 cm^2. Valve area (Vmax):1.44 cm^2. Valve area (Vmean): 1.48 cm^2. - Aorta: Aortic root dimension: 37 mm (ED). - Aortic root: The aortic root was mildly dilated. - Mitral valve: There was mild regurgitation. - Left atrium: The atrium was mildly dilated. - Right atrium: The atrium was mildly dilated. - Tricuspid valve: There was mild regurgitation. - Pulmonary arteries: PA peak pressure: 32 mm Hg (S).  B/L Carotid U/S  06/13/2018 Right Carotid: Velocities in the right ICA are consistent with a 1-39% stenosis. Left Carotid: Velocities in the left ICA are consistent with a 1-39% stenosis. Vertebrals: Bilateral vertebral arteries demonstrate antegrade flow.    ASSESSMENT: Mark Montoya is a 77 y.o. year old male here with left PCA territory ICH with left lateral ventricular small IVH and right sylvian fissure SAH due to hypertension on 06/12/2018. Vascular risk factors include HTN, HLD, DM and history of stroke.     PLAN: -Continue {anticoagulants:31417}  and ***  for secondary stroke prevention -F/u with PCP regarding your *** management -continue to monitor BP at home -advised to continue to stay active and maintain a healthy diet -Maintain strict control of hypertension with blood pressure goal below 130/90, diabetes with hemoglobin A1c goal below 6.5% and cholesterol with LDL cholesterol (bad cholesterol) goal below 70 mg/dL. I also advised the patient to eat a healthy diet with plenty of whole grains, cereals, fruits and vegetables, exercise regularly and maintain ideal body weight.  Follow up in *** or call earlier if needed   Greater than 50% of time during this 25 minute visit was spent on counseling,explanation of diagnosis of ***, reviewing risk factor management of ***, planning of further management, discussion with patient and family and coordination of care    Venancio Poisson, [facility]  [facility] [address] [place], [place] [address]  Phone [phone] Fax [phone] Note: This document was prepared with digital dictation and possible smart phrase technology. Any transcriptional errors that result from this process are unintentional.

## 2018-08-25 ENCOUNTER — Ambulatory Visit: Payer: Medicare Other | Admitting: Adult Health

## 2018-08-25 ENCOUNTER — Telehealth: Payer: Self-pay

## 2018-08-25 NOTE — Telephone Encounter (Signed)
Pt no show for appt today.

## 2018-08-26 ENCOUNTER — Encounter: Payer: Self-pay | Admitting: Adult Health

## 2018-10-20 ENCOUNTER — Encounter: Payer: Medicare Other | Admitting: Physical Medicine & Rehabilitation

## 2018-12-01 ENCOUNTER — Encounter: Payer: Self-pay | Admitting: Physical Medicine & Rehabilitation

## 2018-12-01 ENCOUNTER — Other Ambulatory Visit: Payer: Self-pay

## 2018-12-01 ENCOUNTER — Encounter: Payer: Medicare Other | Attending: Physical Medicine & Rehabilitation | Admitting: Physical Medicine & Rehabilitation

## 2018-12-01 VITALS — BP 93/60 | HR 54 | Ht 69.5 in | Wt 163.0 lb

## 2018-12-01 DIAGNOSIS — F01518 Vascular dementia, unspecified severity, with other behavioral disturbance: Secondary | ICD-10-CM

## 2018-12-01 DIAGNOSIS — F0281 Dementia in other diseases classified elsewhere with behavioral disturbance: Secondary | ICD-10-CM | POA: Insufficient documentation

## 2018-12-01 DIAGNOSIS — F02818 Dementia in other diseases classified elsewhere, unspecified severity, with other behavioral disturbance: Secondary | ICD-10-CM

## 2018-12-01 DIAGNOSIS — F0151 Vascular dementia with behavioral disturbance: Secondary | ICD-10-CM | POA: Insufficient documentation

## 2018-12-01 DIAGNOSIS — Z87820 Personal history of traumatic brain injury: Secondary | ICD-10-CM | POA: Diagnosis not present

## 2018-12-01 MED ORDER — QUETIAPINE FUMARATE 50 MG PO TABS: 50.0000 mg | ORAL_TABLET | Freq: Every day | ORAL | 2 refills | Status: AC

## 2018-12-01 NOTE — Progress Notes (Signed)
Subjective:    Patient ID: Mark Montoya, male    DOB: 1941-09-23, 78 y.o.   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  I last saw him in September. Mark Montoya has been less active in the winter. Mark Montoya has to be encouraged to eat. His granddaughter provides most of his care and has found that Mark Montoya has been increasingly difficult to deal with. Mark Montoya seems to "pick and choose" when Mark Montoya wishes to move and participate with others. Mark Montoya still is sundowning at night. Mark Montoya typically falls asleep between 10pm and 12am at the earliest. Mark Montoya sleeps a lot during the day.   Mark Montoya is eating fairly well for the most part and has maintained his weight    Pain Inventory Average Pain 0 Pain Right Now 0 My pain is no pain  In the last 24 hours, has pain interfered with the following? General activity 0 Relation with others 0 Enjoyment of life 0 What TIME of day is your pain at its worst? no pain Sleep (in general) Fair  Pain is worse with: no pain Pain improves with: no pain Relief from Meds: no meds  Mobility walk without assistance walk with assistance use a walker how many minutes can you walk? 5 ability to climb steps?  yes do you drive?  no use a wheelchair needs help with transfers  Function retired  Neuro/Psych bladder control problems bowel control problems confusion  Prior Studies Any changes since last visit?  no  Physicians involved in your care Any changes since last visit?  no   Family History  Problem Relation Age of Onset  . Heart disease Mother   . Heart failure Mother   . Heart disease Father   . CVA Father   . Cancer Brother   . Cancer Brother   . Cancer Brother   . Hypertension Daughter   . Heart disease Daughter   . Diabetes Daughter   . Multiple sclerosis Daughter   . Lupus Daughter   . Heart disease Brother        CABG  . Heart disease Brother        CABG  . Heart disease Brother        CABG   Social History    Socioeconomic History  . Marital status: Widowed    Spouse name: Not on file  . Number of children: Not on file  . Years of education: Not on file  . Highest education level: Not on file  Occupational History  . Occupation: unemployed   Social Needs  . Financial resource strain: Not on file  . Food insecurity:    Worry: Not on file    Inability: Not on file  . Transportation needs:    Medical: Not on file    Non-medical: Not on file  Tobacco Use  . Smoking status: Never Smoker  . Smokeless tobacco: Current User    Types: Chew  Substance and Sexual Activity  . Alcohol use: No  . Drug use: No  . Sexual activity: Not on file  Lifestyle  . Physical activity:    Days per week: Not on file    Minutes per session: Not on file  . Stress: Not on file  Relationships  . Social connections:    Talks on phone: Not on file    Gets together: Not on file    Attends religious service: Not on file    Active  member of club or organization: Not on file    Attends meetings of clubs or organizations: Not on file    Relationship status: Not on file  Other Topics Concern  . Not on file  Social History Narrative   Resides with grandson and granddaughter in law    Past Surgical History:  Procedure Laterality Date  . CARDIAC CATHETERIZATION  01/22/2002   CABG recommended  . CORONARY ARTERY BYPASS GRAFT  02/03/2002   x5; LIMA-LAD, SVG-D1 and D2, SVG-OM1, and SVG-PDA  . ORIF TIBIA PLATEAU Right 11/02/2013   Procedure: OPEN REDUCTION INTERNAL FIXATION (ORIF) RIGHT TIBIAL PLATEAU;  Surgeon: Budd Palmer, MD;  Location: [facility] OR;  Service: Orthopedics;  Laterality: Right;   Past Medical History:  Diagnosis Date  . Abnormal echocardiogram    NUCLEAR STRESS TEST, 01/28/2007 - EKG negative for ischemia, no significant ischemia demonstrated  . Coronary artery disease 02/03/02   status post CABG by Dr. Andrey Spearman. LIMA-LAD, seq SVG-D1-D2, SVG-OM, SVG-PDA.  Marland Kitchen Hyperlipidemia   . Hypertension    . Stroke (HCC)   . Structural heart disease    2D ECHO, 01/08/2005 - normal-mild  . Syncope    CAROTID DOPPLER, 01/08/2005 - No significant carotid vadcular disease  . Type 2 diabetes mellitus (HCC)    BP 93/60   Pulse (!) 54   Ht 5' 9.5" (1.765 m)   Wt 163 lb (73.9 kg)   SpO2 96%   BMI 23.73 kg/m   Opioid Risk Score:   Fall Risk Score:  `1  Depression screen PHQ 2/9  Depression screen PHQ 2/9 12/01/2018  Decreased Interest 0  Down, Depressed, Hopeless 0  PHQ - 2 Score 0    Review of Systems  Constitutional: Negative.   HENT: Negative.   Eyes: Negative.   Respiratory: Negative.   Cardiovascular: Negative.   Gastrointestinal: Positive for diarrhea.  Endocrine: Negative.   Genitourinary: Negative.   Musculoskeletal: Negative.   Skin: Negative.   Allergic/Immunologic: Negative.   Neurological: Negative.   Hematological: Negative.   Psychiatric/Behavioral: Negative.   All other systems reviewed and are negative.      Objective:   Physical Exam  General: No acute distress HEENT: EOMI, oral membranes moist Cards: reg rate  Chest: normal effort Abdomen: Soft, NT, ND Skin: dry, intact Extremities: no edema Neuro:  sitting in w/c.dysarthric.  Keeps head down. Less alert. Follows basic commands. Moves all 4's. Did not stand for me today.  Musculoskeletal: Full ROM, No pain with AROM or PROM in the neck, trunk, or extremities. Posture appropriate Psych: Patient slightly flat but generally otherwise appropriate.  Mark Montoya was not irritable nor anxious.       Assessment & Plan:  1.  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  3             -ongoing premorbid deficits are complicating his course 2. Cognition/dementia: wean off aricept  -seroquel 50-100mg  qhs to help normalize sleep patterns  -limit benzo use 4.  Diabetes: Per primary with glucophage, glucotrol 5.  Dysphagia: Appetite adequate.      15 minutes of  time was spent with the patient in examina tion, analysis and discussion of treatment plan.  I will see him back in about 2 months time.

## 2018-12-01 NOTE — Patient Instructions (Addendum)
  ARICEPT: DECREASE TO 5MG  X 8 DAYS THEN STOP   SEROQUEL: INCREASE TO 50MG  AT BEDTIME----9PM----TRY THIS FOR A WEEK TO SEE IF IT HELPS HIM FALL ASLEEP. IF NEEDED YOU CAN INCREASE TO 100MG .    TRY TO KEEP HIM AWAKE AS MUCH AS YOU CAN DURING THE DAY.

## 2019-02-02 ENCOUNTER — Encounter: Payer: Medicare Other | Admitting: Physical Medicine & Rehabilitation

## 2019-02-18 DEATH — deceased

## 2020-06-18 IMAGING — DX DG CHEST 2V
2 series · 2 of 2 positions shown · non-contrast
Comparison: 06/15/2018

CLINICAL DATA: 76-year-old male with a history of fever

EXAM:
CHEST - 2 VIEW

[chest lat]
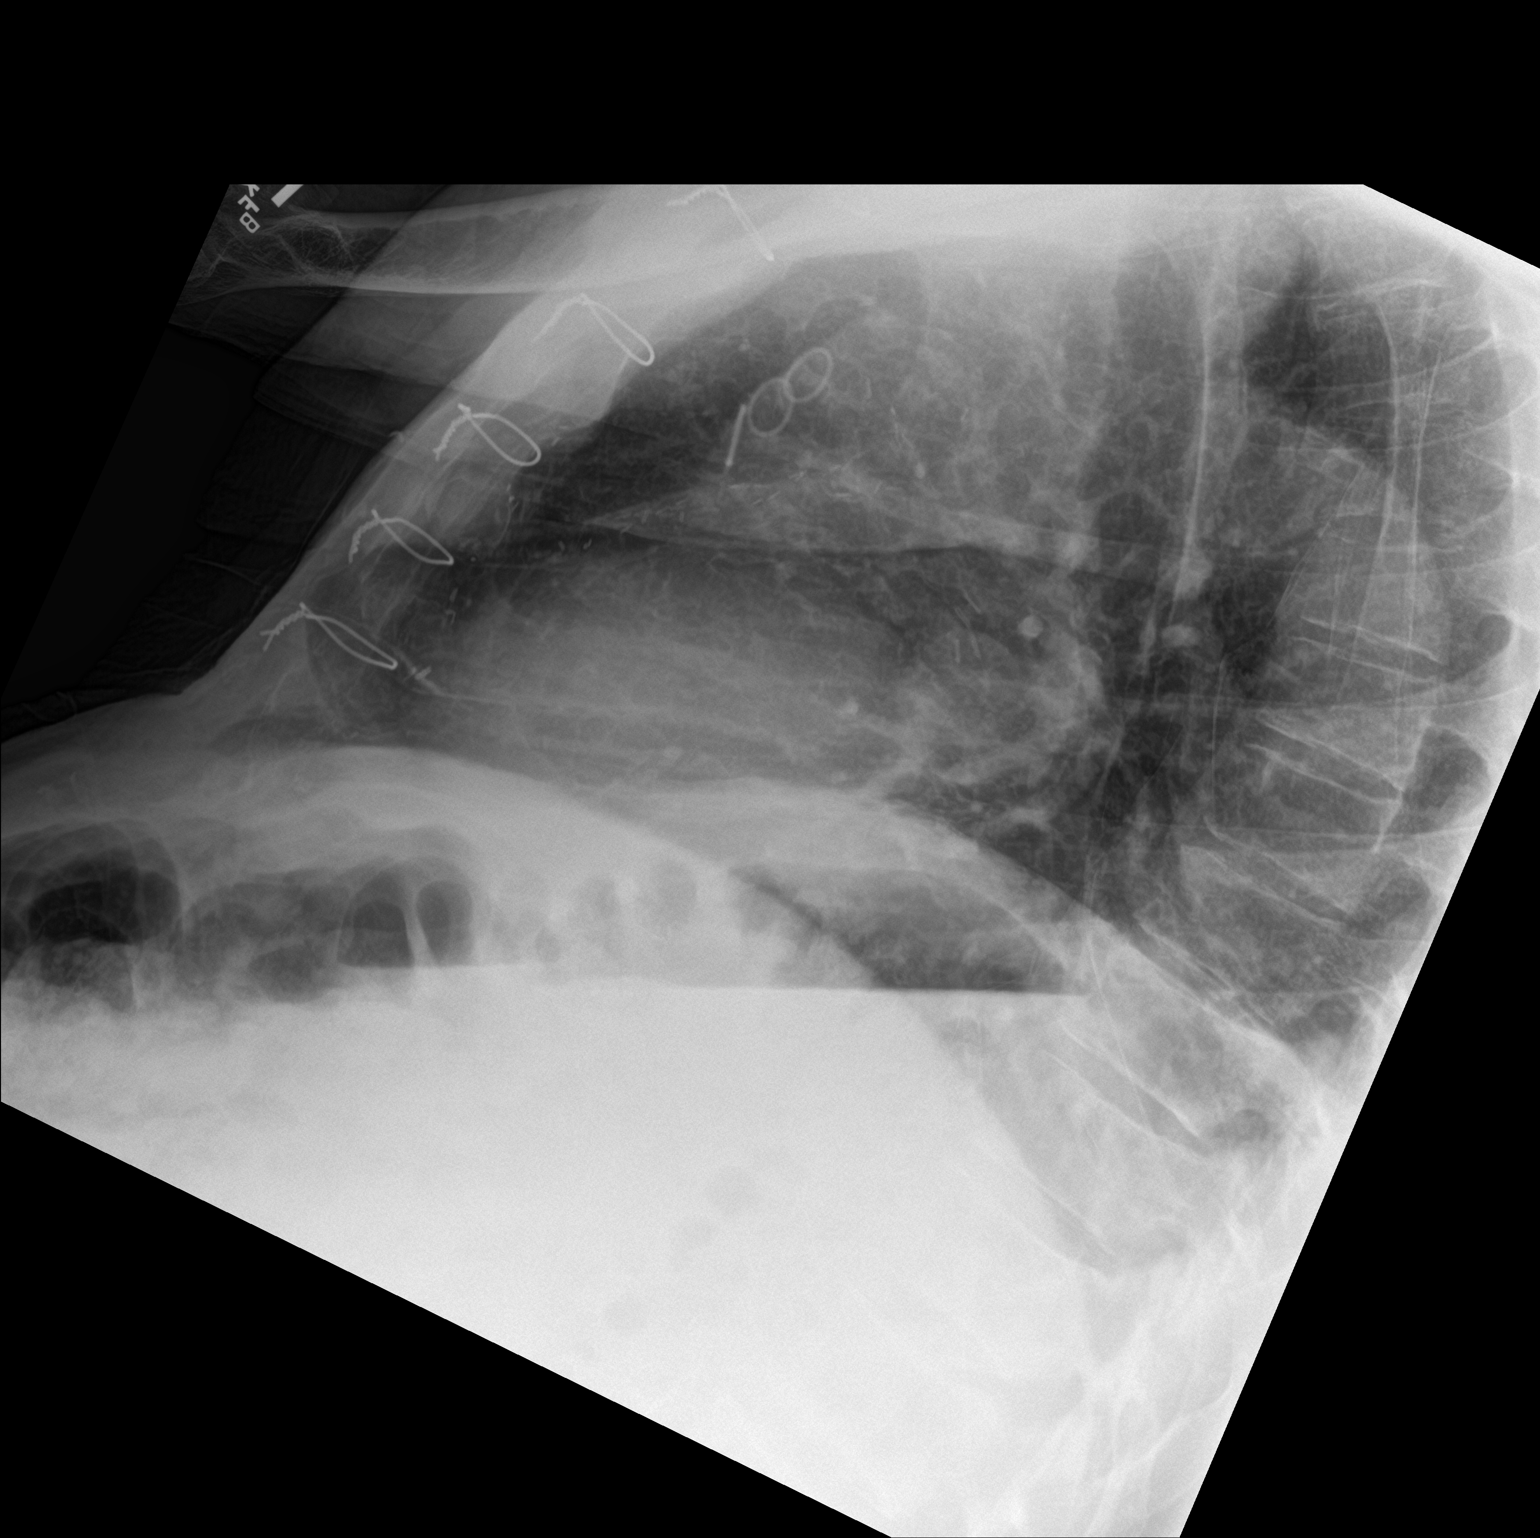

[chest ap]
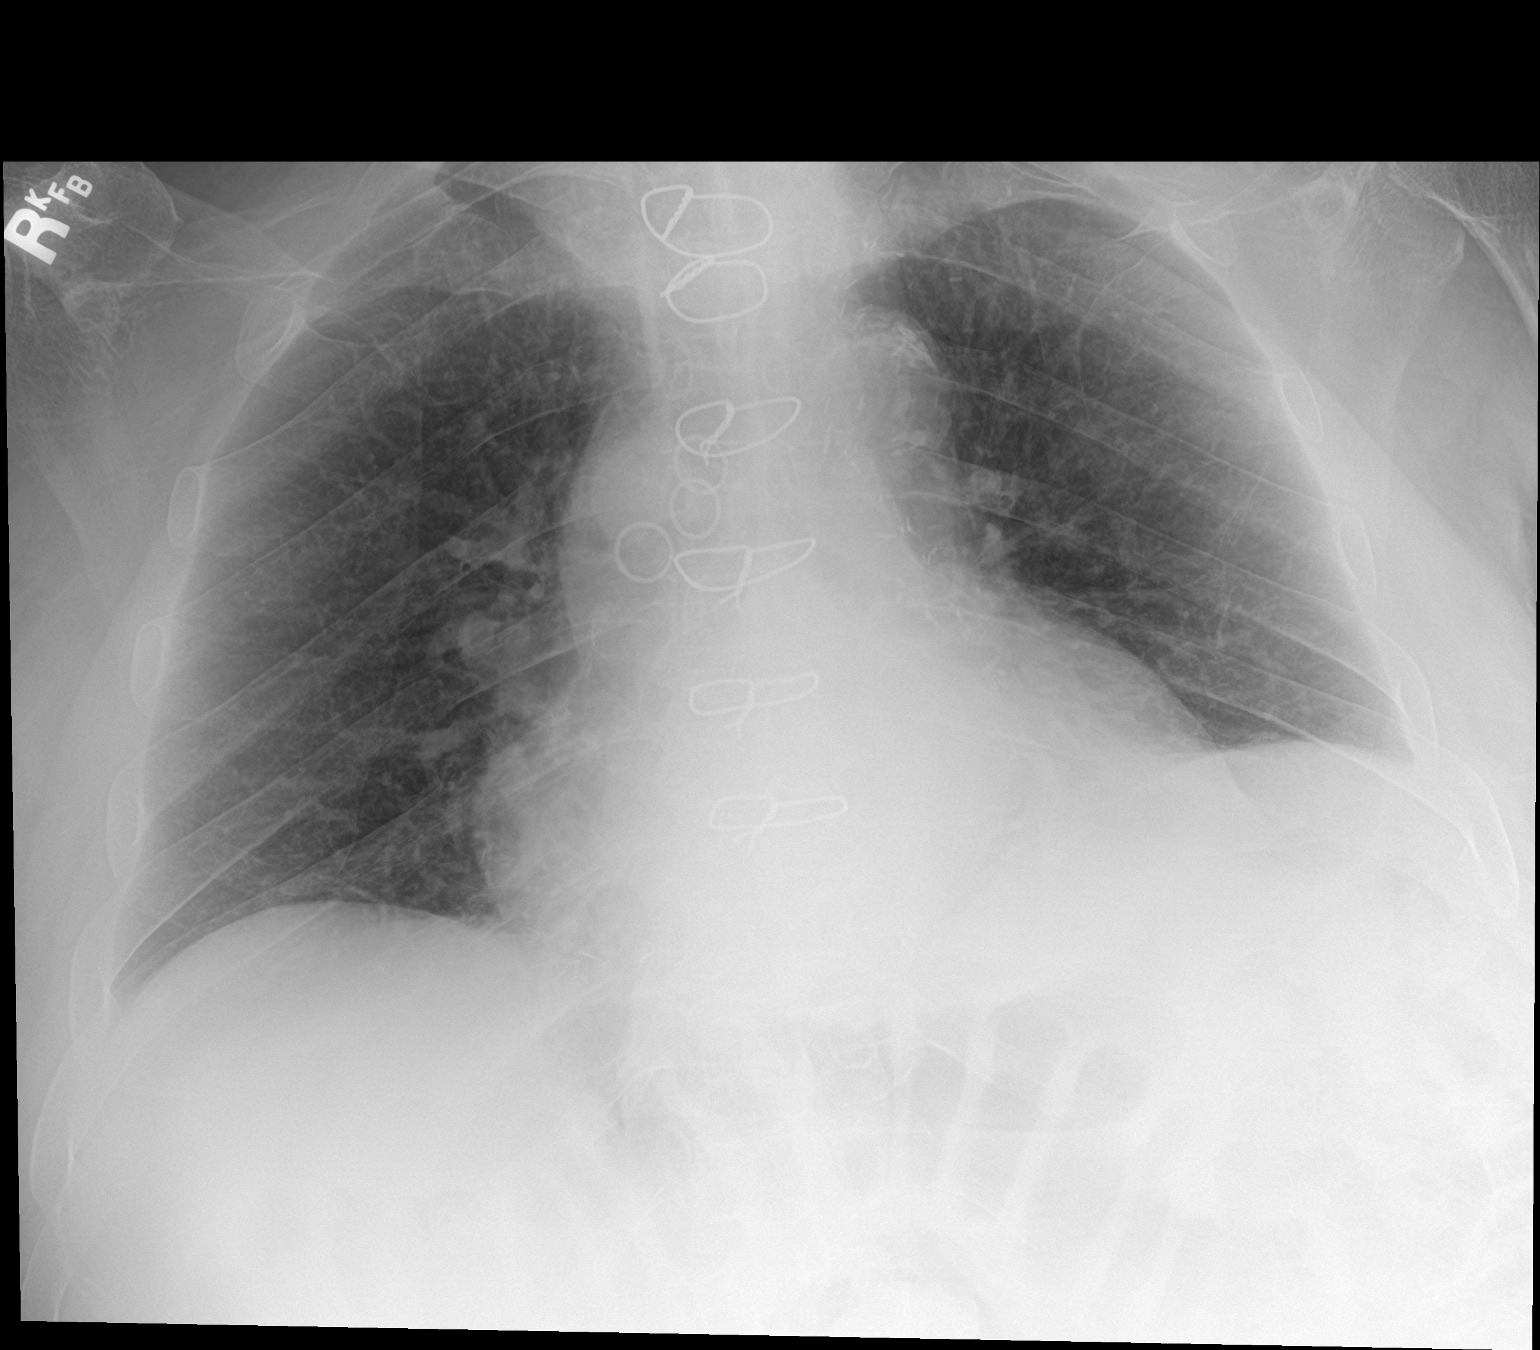

[2 of 2 positions shown; findings below may reference images not displayed]

FINDINGS: Cardiomediastinal silhouette unchanged in size and contour. Surgical
changes of median sternotomy and CABG. Calcifications of the aortic
arch.

Low lung volumes. No evidence of central vascular congestion or
interlobular septal thickening. Coarsened interstitial markings
similar to prior.

No pleural effusion on the lateral view.  No pneumothorax.

Asymmetric elevation of left hemidiaphragm, similar to prior.

No confluent airspace disease.
IMPRESSION: Low volumes and chronic lung changes without evidence of
superimposed acute cardiopulmonary disease.

Surgical changes of median sternotomy and CABG.

## 2020-06-21 IMAGING — CR DG CHEST 2V
2 series · 2 of 2 positions shown · non-contrast
Comparison: Chest radiograph performed 06/20/2018

CLINICAL DATA: Acute onset of fever. Recent cerebral hemorrhage.

EXAM:
CHEST - 2 VIEW

[chest lat]
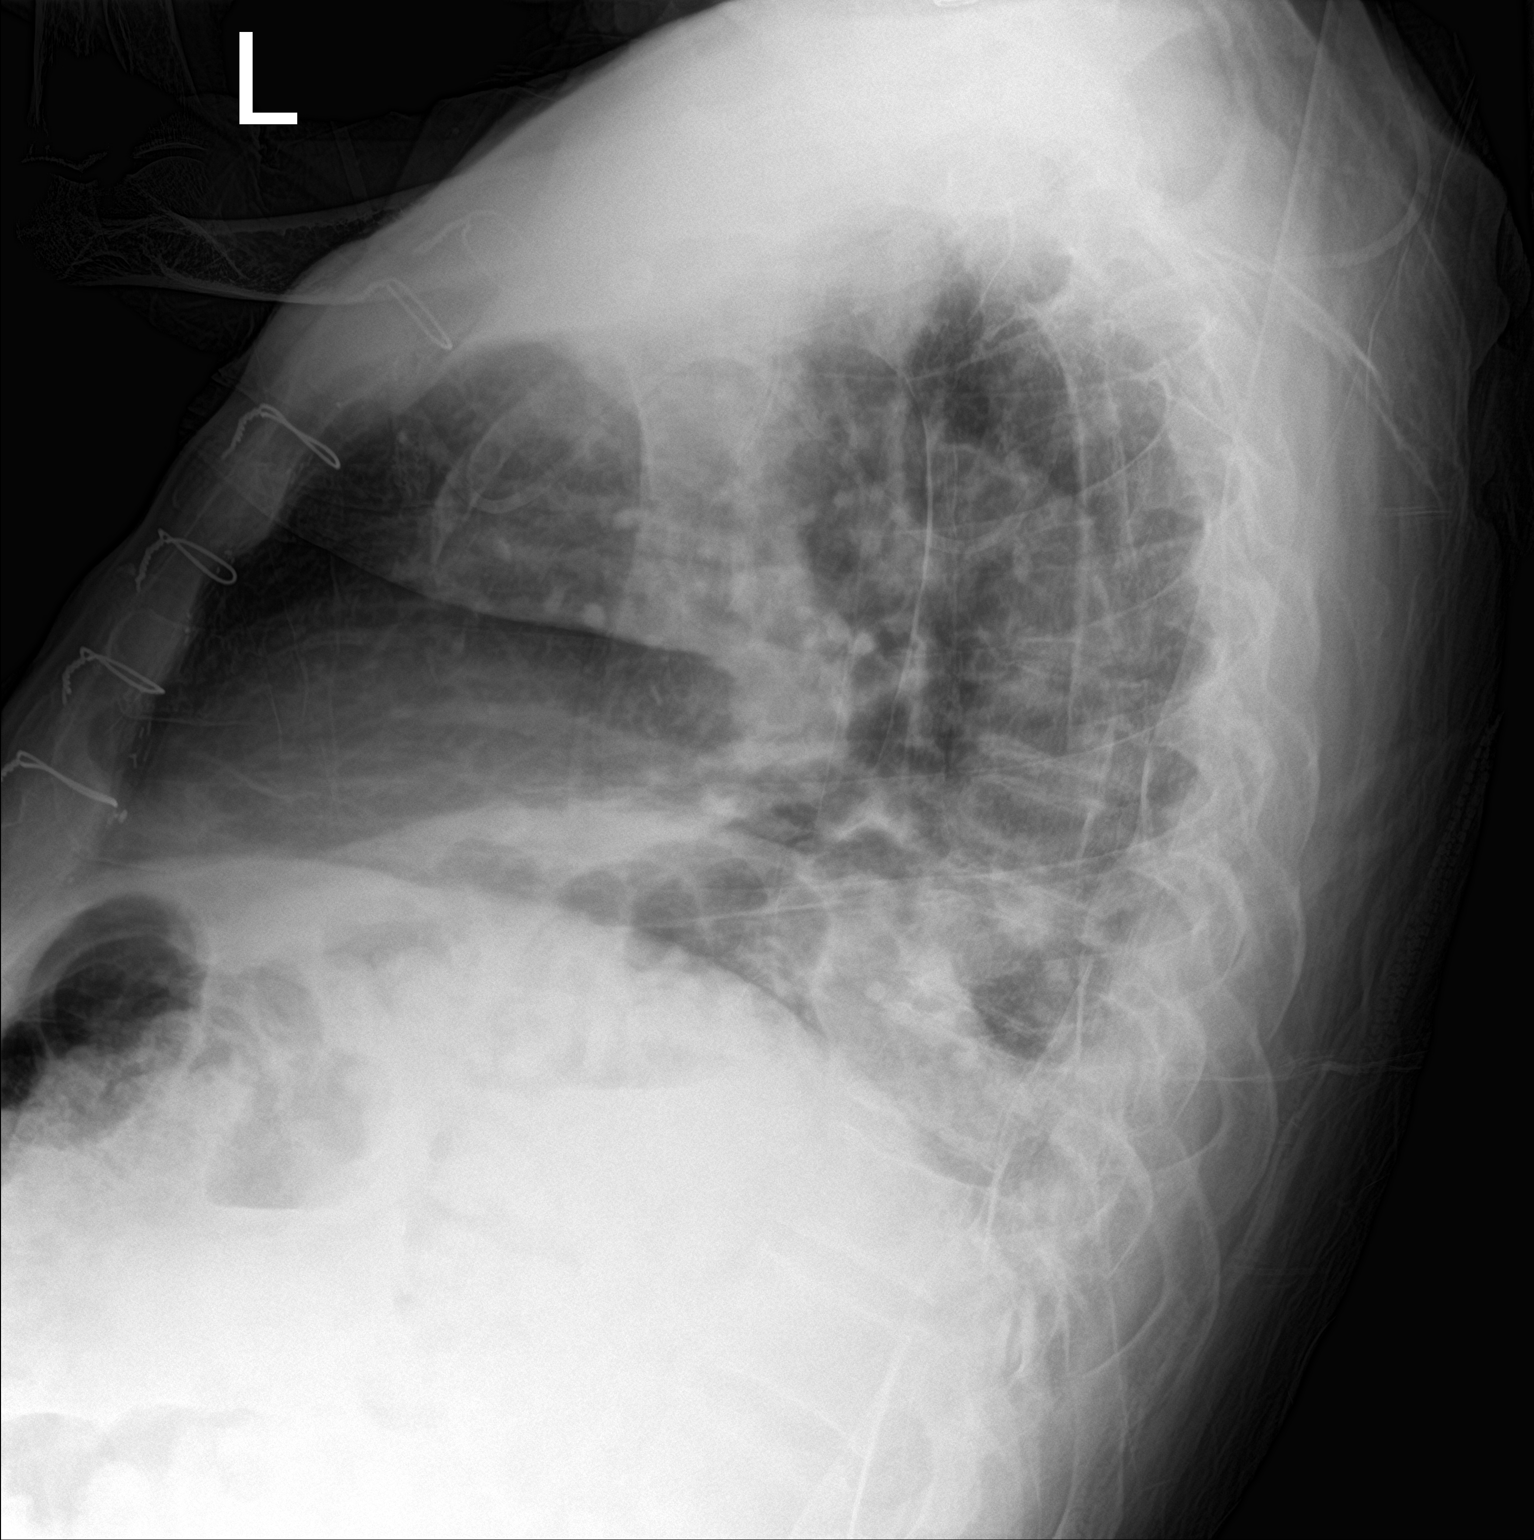

[chest ap]
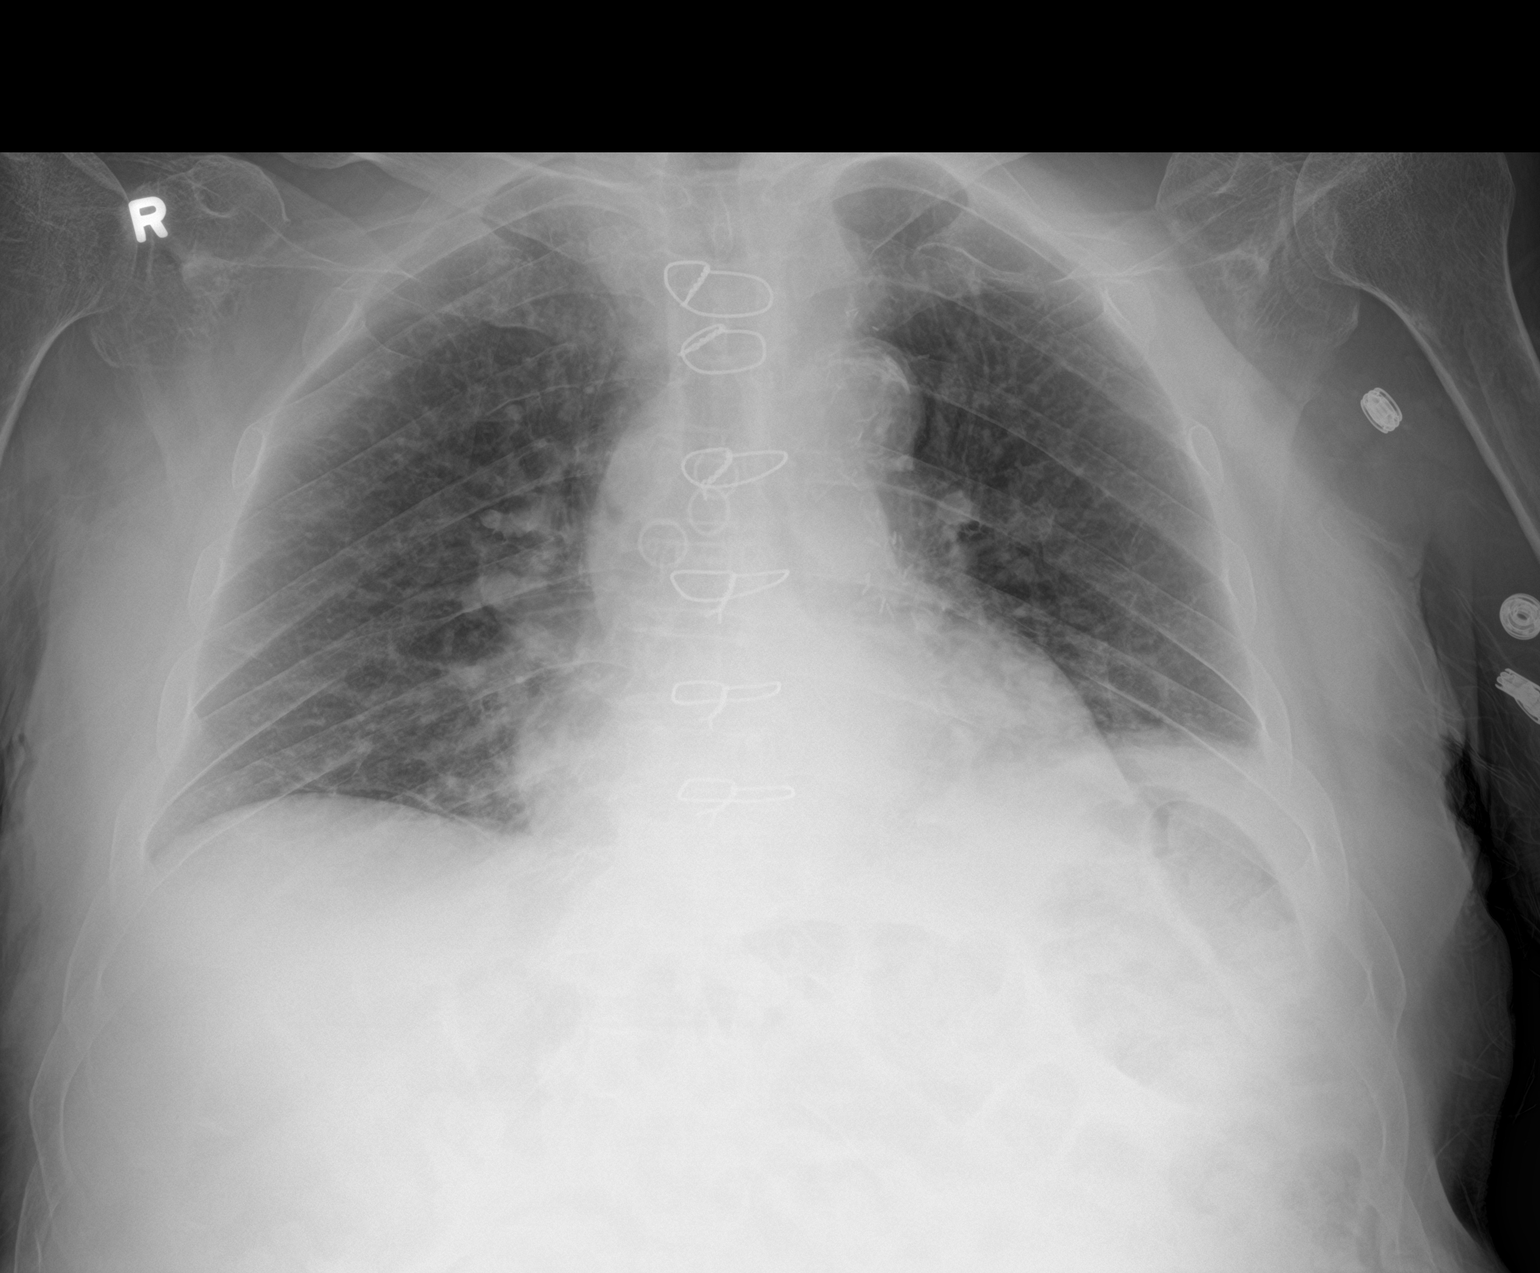

[2 of 2 positions shown; findings below may reference images not displayed]

FINDINGS: The lungs are mildly hypoexpanded. Mild left basilar airspace
opacity may reflect pneumonia. Mild vascular congestion is noted. No
pleural effusion or pneumothorax is seen.

The heart is normal in size; the patient is status post median
sternotomy, with evidence of prior CABG. No acute osseous
abnormalities are seen.
IMPRESSION: Lungs mildly hypoexpanded. Mild left basilar airspace opacity may
reflect pneumonia. Mild vascular congestion noted.

## 2020-06-22 IMAGING — DX DG ORTHOPANTOGRAM /PANORAMIC
2 series · 2 of 2 positions shown · non-contrast
Comparison: CT 05/26/2018

CLINICAL DATA: Port intention.  Fever.

EXAM:
ORTHOPANTOGRAM/PANORAMIC

[view not recorded (1 of 2)]
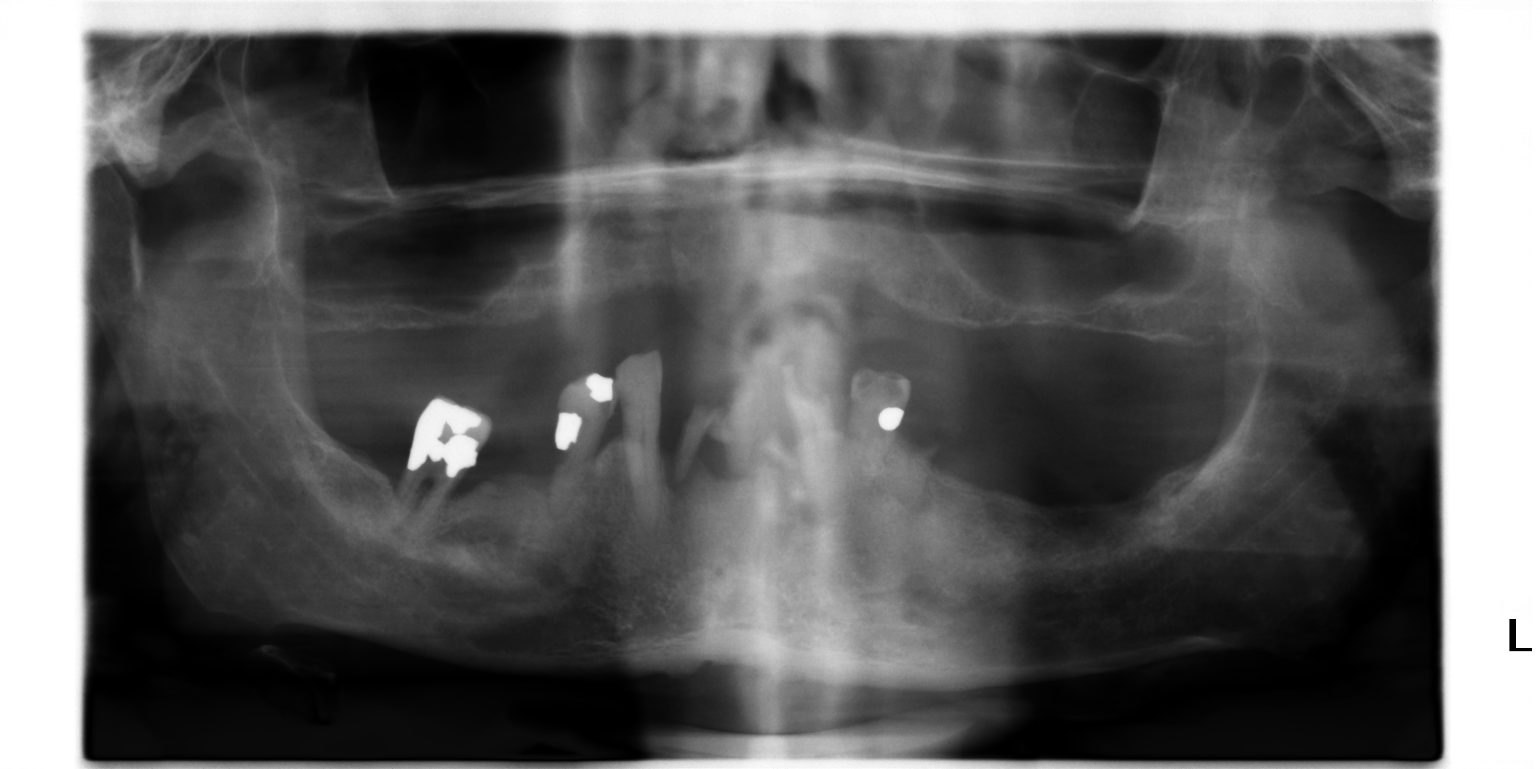

[view not recorded (2 of 2)]
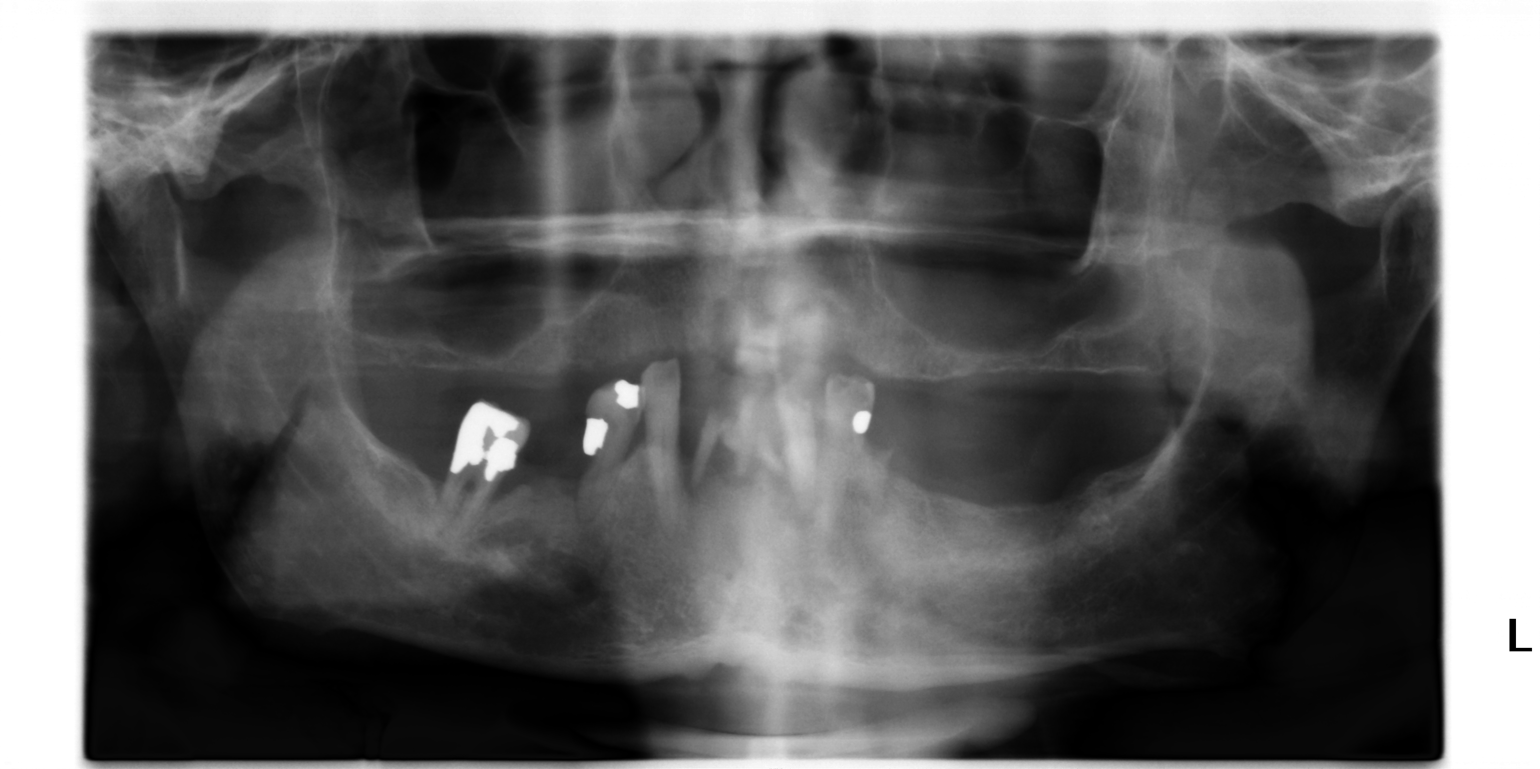

[2 of 2 positions shown; findings below may reference images not displayed]

FINDINGS: Patient has poor dentition. Prominent periapical lucencies noted
about the lower incisors. Mild periapical lucencies noted about the
remaining teeth. No evidence of fracture. Visualized paranasal
sinuses clear.
IMPRESSION: Patient's poor dentition. Prominent periapical lucencies noted about
the lower incisors. Mild periapical lucencies noted about the
remaining teeth.

## 2021-04-06 ENCOUNTER — Encounter (INDEPENDENT_AMBULATORY_CARE_PROVIDER_SITE_OTHER): Payer: Self-pay | Admitting: *Deleted
